# Patient Record
Sex: Male | Born: 1965
Health system: Southern US, Community
[De-identification: ages and names within clinical notes are randomized; demographics above are authoritative.]

## PROBLEM LIST (undated history)

## (undated) ENCOUNTER — Ambulatory Visit (HOSPITAL_COMMUNITY): Admission: EM | Payer: 59 | Source: Home / Self Care

## (undated) DIAGNOSIS — G959 Disease of spinal cord, unspecified: Secondary | ICD-10-CM

## (undated) DIAGNOSIS — I10 Essential (primary) hypertension: Secondary | ICD-10-CM

## (undated) DIAGNOSIS — R269 Unspecified abnormalities of gait and mobility: Secondary | ICD-10-CM

## (undated) HISTORY — DX: Disease of spinal cord, unspecified: G95.9

## (undated) HISTORY — DX: Unspecified abnormalities of gait and mobility: R26.9

## (undated) HISTORY — PX: OTHER SURGICAL HISTORY: SHX169

---

## 1997-07-22 ENCOUNTER — Encounter: Admission: RE | Admit: 1997-07-22 | Discharge: 1997-07-22 | Payer: Self-pay | Admitting: Hematology and Oncology

## 1998-08-15 ENCOUNTER — Emergency Department (HOSPITAL_COMMUNITY): Admission: EM | Admit: 1998-08-15 | Discharge: 1998-08-15 | Payer: Self-pay | Admitting: Emergency Medicine

## 1998-08-15 ENCOUNTER — Encounter: Payer: Self-pay | Admitting: Emergency Medicine

## 2008-05-10 ENCOUNTER — Emergency Department (HOSPITAL_COMMUNITY): Admission: EM | Admit: 2008-05-10 | Discharge: 2008-05-10 | Payer: Self-pay | Admitting: Emergency Medicine

## 2009-10-25 ENCOUNTER — Emergency Department (HOSPITAL_COMMUNITY): Admission: EM | Admit: 2009-10-25 | Discharge: 2009-10-25 | Payer: Self-pay | Admitting: Emergency Medicine

## 2009-10-26 ENCOUNTER — Observation Stay (HOSPITAL_COMMUNITY): Admission: EM | Admit: 2009-10-26 | Discharge: 2009-10-26 | Payer: Self-pay | Admitting: Emergency Medicine

## 2010-01-23 ENCOUNTER — Emergency Department (HOSPITAL_COMMUNITY): Admission: EM | Admit: 2010-01-23 | Discharge: 2010-01-23 | Payer: Self-pay | Admitting: Emergency Medicine

## 2010-01-23 ENCOUNTER — Encounter: Payer: Self-pay | Admitting: Internal Medicine

## 2010-01-30 ENCOUNTER — Ambulatory Visit: Payer: Self-pay | Admitting: Internal Medicine

## 2010-01-30 DIAGNOSIS — R9431 Abnormal electrocardiogram [ECG] [EKG]: Secondary | ICD-10-CM | POA: Insufficient documentation

## 2010-01-30 DIAGNOSIS — I10 Essential (primary) hypertension: Secondary | ICD-10-CM | POA: Insufficient documentation

## 2010-01-30 DIAGNOSIS — R079 Chest pain, unspecified: Secondary | ICD-10-CM | POA: Insufficient documentation

## 2010-01-30 DIAGNOSIS — I119 Hypertensive heart disease without heart failure: Secondary | ICD-10-CM | POA: Insufficient documentation

## 2010-04-20 NOTE — Assessment & Plan Note (Signed)
Summary: EPH   Visit Type:  new pt /  eph  CC:  no complaints.  History of Present Illness: patient is a 45 year old who was referred from the ER for evaluation of chest pain, abnormal ECG The patient was seen in the ER on 11/7 with chest pain.  He had said it began 3 days prior.  Worse with palpaiton  persistent.  Not associated with SOB.  ECG showed SB 59 bpm.  T wave inversion II, III, AVF, V3, V4, V5.   In the ER his bp was elevated.  It had been elevated on other ER visits.  He had been on BP meds in the past but stopped. Since last week his pain has improved some.  It is still present with pressing. He deneis Cp with activity.  No SOB.  No PND  No edema.  He is able to walk, cut grass without a problem.  Current Medications (verified): 1)  None  Allergies (verified): No Known Drug Allergies  Past History:  Past Medical History: Last updated: 01/27/2010 Hypertension chest wall pain  Family History: Reviewed history and no changes required. Mother 63 father 33.  Had CABG at 35  Social History: Reviewed history from 01/27/2010 and no changes required. Tobacco Use - 1/2 pack per week Alcohol Use - yes-occasional Drug Use - no  Review of Systems       All systems reviewed.  Neg to the abvoe problem except as noted above.  Vital Signs:  Patient profile:   45 year old male Height:      72 inches Weight:      150.50 pounds BMI:     20.49 Pulse rate:   59 / minute BP sitting:   132 / 86  (left arm) Cuff size:   regular  Vitals Entered By: Caralee Ates CMA (January 30, 2010 1:54 PM)  Physical Exam  Additional Exam:  patient is well developed 45 year old in NAD HEENT:  Normocephalic, atraumatic. EOMI, PERRLA.  Neck: JVP is normal. No thyromegaly. No bruits.  Lungs: clear to auscultation. No rales no wheezes.  Chest:  tender in L parasternal area. Heart: Regular rate and rhythm. Normal S1, S2. No S3.   No significant murmurs. PMI not displaced.  Abdomen:   Supple, nontender. Normal bowel sounds. No masses. No hepatomegaly.  Extremities:   Good distal pulses throughout. No lower extremity edema.  Musculoskeletal :moving all extremities.  Neuro:   alert and oriented x3.    EKG  Procedure date:  01/30/2010  Findings:      Sinus bradycardia.  59 bpm.  T wave inversion III, AVF.    Impression & Recommendations:  Problem # 1:  CHEST PAIN-UNSPECIFIED (ICD-786.50) pain is not cardiac.  Is musculoskel  Rec: rest, pain meds as needed.  Problem # 2:  HYPERTENSION, BENIGN (ICD-401.1) BP is fair today.  It has been elevated on ER visits.  This needs to be folllowed and treated if necessary.  the patient is trying to get into [facility] for primary care.    I would nt begin RX today as not signif elevated today.  Problem # 3:  ABNORMAL ELECTROCARDIOGRAM (ICD-794.31) ECG does have dynamic T wave changes.  I am not convinced these are diagnositc.  Patinet with normal exam.  No history to sugg ischemia/angina.  No work up planned.  Other Orders: EKG w/ Interpretation (93000)  Patient Instructions: 1)  Will be availabel as needed if symptoms change.  You  need to establish with a primary MD.

## 2010-05-30 LAB — POCT I-STAT, CHEM 8
BUN: 8 mg/dL (ref 6–23)
Calcium, Ion: 1.07 mmol/L — ABNORMAL LOW (ref 1.12–1.32)
Chloride: 103 mEq/L (ref 96–112)
Creatinine, Ser: 1.2 mg/dL (ref 0.4–1.5)
Glucose, Bld: 86 mg/dL (ref 70–99)
HCT: 43 % (ref 39.0–52.0)
Hemoglobin: 14.6 g/dL (ref 13.0–17.0)
Potassium: 4.1 mEq/L (ref 3.5–5.1)
Sodium: 136 mEq/L (ref 135–145)
TCO2: 26 mmol/L (ref 0–100)

## 2010-05-30 LAB — POCT CARDIAC MARKERS
CKMB, poc: <1 ng/mL — ABNORMAL LOW (ref 1.0–8.0)
Myoglobin, poc: 26.7 ng/mL (ref 12–200)
Troponin i, poc: <0.05 ng/mL (ref 0.00–0.09)

## 2010-09-06 ENCOUNTER — Emergency Department (HOSPITAL_COMMUNITY)
Admission: EM | Admit: 2010-09-06 | Discharge: 2010-09-06 | Disposition: A | Discharge status: Home / Self Care | Payer: Self-pay | Attending: Emergency Medicine | Admitting: Emergency Medicine

## 2010-09-06 DIAGNOSIS — T63461A Toxic effect of venom of wasps, accidental (unintentional), initial encounter: Secondary | ICD-10-CM | POA: Insufficient documentation

## 2010-09-06 DIAGNOSIS — I10 Essential (primary) hypertension: Secondary | ICD-10-CM | POA: Insufficient documentation

## 2010-09-06 DIAGNOSIS — R220 Localized swelling, mass and lump, head: Secondary | ICD-10-CM | POA: Insufficient documentation

## 2010-09-06 DIAGNOSIS — T6391XA Toxic effect of contact with unspecified venomous animal, accidental (unintentional), initial encounter: Secondary | ICD-10-CM | POA: Insufficient documentation

## 2010-09-06 DIAGNOSIS — L509 Urticaria, unspecified: Secondary | ICD-10-CM | POA: Insufficient documentation

## 2010-09-06 LAB — POCT I-STAT, CHEM 8
BUN: 9 mg/dL (ref 6–23)
Calcium, Ion: 1.14 mmol/L (ref 1.12–1.32)
Chloride: 105 mEq/L (ref 96–112)
Creatinine, Ser: 1 mg/dL (ref 0.50–1.35)
Glucose, Bld: 94 mg/dL (ref 70–99)
HCT: 48 % (ref 39.0–52.0)
Hemoglobin: 16.3 g/dL (ref 13.0–17.0)
Potassium: 3.9 mEq/L (ref 3.5–5.1)
Sodium: 139 mEq/L (ref 135–145)
TCO2: 22 mmol/L (ref 0–100)

## 2015-03-02 ENCOUNTER — Encounter (HOSPITAL_COMMUNITY): Payer: Self-pay | Admitting: Emergency Medicine

## 2015-03-02 ENCOUNTER — Emergency Department (INDEPENDENT_AMBULATORY_CARE_PROVIDER_SITE_OTHER)
Admission: EM | Admit: 2015-03-02 | Discharge: 2015-03-02 | Disposition: A | Discharge status: Home / Self Care | Payer: BLUE CROSS/BLUE SHIELD | Source: Home / Self Care

## 2015-03-02 DIAGNOSIS — T23221A Burn of second degree of single right finger (nail) except thumb, initial encounter: Secondary | ICD-10-CM

## 2015-03-02 NOTE — Discharge Instructions (Signed)
Burn Care °Burns hurt your skin. When your skin is hurt, it is easier to get an infection. Follow your doctor's directions to help prevent an infection. °HOME CARE °· Wash your hands well before you change your bandage. °· Change your bandage as often as told by your doctor. °¨ Remove the old bandage. If the bandage sticks, soak it off with cool, clean water. °¨ Gently clean the burn with mild soap and water. °¨ Pat the burn dry with a clean, dry cloth. °¨ Put a thin layer of medicated cream on the burn. °¨ Put a clean bandage on as told by your doctor. °¨ Keep the bandage clean and dry. °· Raise (elevate) the burn for the first 24 hours. After that, follow your doctor's directions. °· Only take medicine as told by your doctor. °GET HELP RIGHT AWAY IF:  °· You have too much pain. °· The skin near the burn is red, tender, puffy (swollen), or has red streaks. °· The burn area has yellowish white fluid (pus) or a bad smell coming from it. °· You have a fever. °MAKE SURE YOU:  °· Understand these instructions. °· Will watch your condition. °· Will get help right away if you are not doing well or get worse. °  °This information is not intended to replace advice given to you by your health care provider. Make sure you discuss any questions you have with your health care provider. °  °Document Released: 12/13/2007 Document Revised: 05/28/2011 Document Reviewed: 07/26/2010 °Elsevier Interactive Patient Education ©2016 [employer] ° °
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
## 2016-03-20 ENCOUNTER — Encounter (HOSPITAL_COMMUNITY): Payer: Self-pay

## 2016-03-20 ENCOUNTER — Emergency Department (HOSPITAL_COMMUNITY)
Admission: EM | Admit: 2016-03-20 | Discharge: 2016-03-21 | Disposition: A | Discharge status: Home / Self Care | Payer: PRIVATE HEALTH INSURANCE | Attending: Emergency Medicine | Admitting: Emergency Medicine

## 2016-03-20 DIAGNOSIS — F17210 Nicotine dependence, cigarettes, uncomplicated: Secondary | ICD-10-CM | POA: Diagnosis not present

## 2016-03-20 DIAGNOSIS — R531 Weakness: Secondary | ICD-10-CM | POA: Insufficient documentation

## 2016-03-20 DIAGNOSIS — G959 Disease of spinal cord, unspecified: Secondary | ICD-10-CM

## 2016-03-20 DIAGNOSIS — R29898 Other symptoms and signs involving the musculoskeletal system: Secondary | ICD-10-CM | POA: Diagnosis present

## 2016-03-20 DIAGNOSIS — R292 Abnormal reflex: Secondary | ICD-10-CM | POA: Insufficient documentation

## 2016-03-20 DIAGNOSIS — Z5181 Encounter for therapeutic drug level monitoring: Secondary | ICD-10-CM | POA: Diagnosis not present

## 2016-03-20 DIAGNOSIS — R200 Anesthesia of skin: Secondary | ICD-10-CM | POA: Diagnosis present

## 2016-03-20 DIAGNOSIS — R269 Unspecified abnormalities of gait and mobility: Secondary | ICD-10-CM | POA: Insufficient documentation

## 2016-03-20 DIAGNOSIS — I10 Essential (primary) hypertension: Secondary | ICD-10-CM | POA: Insufficient documentation

## 2016-03-20 DIAGNOSIS — G9520 Unspecified cord compression: Secondary | ICD-10-CM

## 2016-03-20 LAB — COMPREHENSIVE METABOLIC PANEL
ALT: 12 U/L — ABNORMAL LOW (ref 17–63)
AST: 25 U/L (ref 15–41)
Albumin: 3.9 g/dL (ref 3.5–5.0)
Alkaline Phosphatase: 41 U/L (ref 38–126)
Anion gap: 13 (ref 5–15)
BUN: 9 mg/dL (ref 6–20)
CO2: 22 mmol/L (ref 22–32)
Calcium: 9.4 mg/dL (ref 8.9–10.3)
Chloride: 101 mmol/L (ref 101–111)
Creatinine, Ser: 0.96 mg/dL (ref 0.61–1.24)
GFR calc Af Amer: >60 mL/min (ref >60)
GFR calc non Af Amer: >60 mL/min (ref >60)
Glucose, Bld: 121 mg/dL — ABNORMAL HIGH (ref 65–99)
Potassium: 4.1 mmol/L (ref 3.5–5.1)
Sodium: 136 mmol/L (ref 135–145)
Total Bilirubin: 0.6 mg/dL (ref 0.3–1.2)
Total Protein: 7.5 g/dL (ref 6.5–8.1)

## 2016-03-20 LAB — CBC WITH DIFFERENTIAL/PLATELET
Basophils Absolute: 0 10*3/uL (ref 0.0–0.1)
Basophils Relative: 1 %
Eosinophils Absolute: 0.1 10*3/uL (ref 0.0–0.7)
Eosinophils Relative: 2 %
HCT: 38.5 % — ABNORMAL LOW (ref 39.0–52.0)
Hemoglobin: 12.7 g/dL — ABNORMAL LOW (ref 13.0–17.0)
Lymphocytes Relative: 43 %
Lymphs Abs: 2.1 10*3/uL (ref 0.7–4.0)
MCH: 29.1 pg (ref 26.0–34.0)
MCHC: 33 g/dL (ref 30.0–36.0)
MCV: 88.1 fL (ref 78.0–100.0)
Monocytes Absolute: 0.4 10*3/uL (ref 0.1–1.0)
Monocytes Relative: 7 %
Neutro Abs: 2.4 10*3/uL (ref 1.7–7.7)
Neutrophils Relative %: 47 %
Platelets: 223 10*3/uL (ref 150–400)
RBC: 4.37 MIL/uL (ref 4.22–5.81)
RDW: 13.4 % (ref 11.5–15.5)
WBC: 5 10*3/uL (ref 4.0–10.5)

## 2016-03-20 NOTE — ED Triage Notes (Signed)
Onset 2 weeks numbness to bilateral legs and feet.  No pain.  Pt reports at times it feels like legs are going to give out.

## 2016-03-20 NOTE — ED Notes (Signed)
Asked pt for urine sample. Pt stated he went to the restroom earlier. Pt stated unable to at this time. Pt asking for permission to drink water.

## 2016-03-20 NOTE — ED Provider Notes (Signed)
[facility]-EMERGENCY DEPT Provider Note   CSN: [id number] Arrival date & time: 03/20/16  1332     History   Chief Complaint Chief Complaint  Patient presents with  . Numbness    HPI VIHAAN GLOSS is a 51 y.o. male.  HPI   MIKING USREY is a 51 y.o. male, with a history of Guillain-Barr, presenting to the ED with lower extremity weakness for at least the past month. The weakness is bilateral and progressive beginning in the feet and moving upward. Also endorses some increasing unsteadiness due to leg weakness. When asked about changes in sensation, patient states, "I feel like I can't feel my legs as well, but I know when you're touching them. Patient has a history of Guillain-Barr a number of years ago and states that his current symptoms feel similar.   Denies numbness, back pain, falls, recent illness, changes in bowel or bladder function, or any other complaints.    History reviewed. No pertinent past medical history.  Patient Active Problem List   Diagnosis Date Noted  . Lower extremity weakness 03/21/2016  . HYPERTENSION, BENIGN 01/30/2010  . 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 CHEST PAIN-UNSPECIFIED 01/30/2010  . ABNORMAL ELECTROCARDIOGRAM 01/30/2010    History reviewed. No pertinent surgical history.     Home Medications    Prior to Admission medications   Not on File    Family History History reviewed. No pertinent family history.  Social History Social History  Substance Use Topics  . Smoking status: Current Some Day Smoker    Types: Cigarettes  . Smokeless tobacco: Never Used  . Alcohol use 2.4 oz/week    4 Cans of beer per week     Allergies   Patient has no known allergies.   Review of Systems Review of Systems  Constitutional: Negative for chills and fever.  Respiratory: Negative for shortness of breath.   Cardiovascular: Negative for chest pain.  Gastrointestinal: Negative for abdominal pain, nausea and vomiting.   Genitourinary: Negative for decreased urine volume and difficulty urinating.  Neurological: Positive for weakness. Negative for syncope and headaches.  All other systems reviewed and are negative.    Physical Exam Updated Vital Signs BP 186/96 (BP Location: Right Arm)   Pulse (!) 59   Temp 99.1 F (37.3 C) (Oral)   Resp 16   Ht 5\' 11"  (1.803 m)   Wt 68 kg   SpO2 100%   BMI 20.92 kg/m   Physical Exam  Constitutional: He is oriented to person, place, and time. He appears well-developed and well-nourished. No distress.  HENT:  Head: Normocephalic and atraumatic.  Eyes: Conjunctivae are normal.  Neck: Neck supple.  Cardiovascular: Normal rate, regular rhythm, normal heart sounds and intact distal pulses.   Pulmonary/Chest: Effort normal and breath sounds normal. No respiratory distress.  Abdominal: Soft. There is no tenderness. There is no guarding.  Musculoskeletal: He exhibits no edema.  Lymphadenopathy:    He has no cervical adenopathy.  Neurological: He is alert and oriented to person, place, and time. He displays abnormal reflex.  Reflex Scores:      Patellar reflexes are 1+ on the right side and 1+ on the left side. Endorses decrease sensation to light touch and pain to the bilateral lower extremities. Strength 5 out of 5 bilaterally. Patient is unsteady while standing. Slow, hesitant gait with unsteadiness.  Skin: Skin is warm and dry. He is not diaphoretic.  Psychiatric: He has a normal mood and affect.  His behavior is normal.  Nursing note and vitals reviewed.    ED Treatments / Results  Labs (all labs ordered are listed, but only abnormal results are displayed) Labs Reviewed  COMPREHENSIVE METABOLIC PANEL - Abnormal; Notable for the following:       Result Value   Glucose, Bld 121 (*)    ALT 12 (*)    All other components within normal limits  CBC WITH DIFFERENTIAL/PLATELET - Abnormal; Notable for the following:    Hemoglobin 12.7 (*)    HCT 38.5 (*)     All other components within normal limits  URINALYSIS, ROUTINE W REFLEX MICROSCOPIC  RAPID URINE DRUG SCREEN, HOSP PERFORMED  VITAMIN B12  HIV-1 RNA, QUALITATIVE, TMA  COPPER, SERUM    EKG  EKG Interpretation None       Radiology No results found.  Procedures Procedures (including critical care time)  Medications Ordered in ED Medications - No data to display   Initial Impression / Assessment and Plan / ED Course  I have reviewed the triage vital signs and the nursing notes.  Pertinent labs & imaging results that were available during my care of the patient were reviewed by me and considered in my medical decision making (see chart for details).  Clinical Course     Patient presents with progressive lower extremity weakness. Abnormalities found on exam. Neurology consult. On reevaluation, patient has no progression of his symptoms and is resting comfortably on the bed.  9:26 PM Spoke with Dr. Otelia LimesLindzen, neurologist, who states he will come evaluate the patient in the ED. 12:18 AM Dr. Otelia LimesLindzen evaluated the patient. Suspects a possible spinal cord lesion or similar deficit. Requests cervical and thoracic spine MRIs, B12 level, and hospitalist admit with neurology consulting.  12:57 AM Spoke with Dr. Katrinka BlazingSmith, hospitalist, who agreed to admit the patient. Requested regular neuro checks added to the holding orders.   Findings and plan of care discussed with Crista Curbana Liu, MD.    Vitals:   03/20/16 1416 03/20/16 1815 03/20/16 1919 03/20/16 1930  BP: 177/95 175/84 186/96 156/86  Pulse: 62 77 (!) 59 (!) 53  Resp: 20 16 16    Temp: 99.1 F (37.3 C)     TempSrc: Oral     SpO2: 100% 100% 100% 99%  Weight: 68 kg     Height: 5\' 11"  (1.803 m)        Final Clinical Impressions(s) / ED Diagnoses   Final diagnoses:  Weakness of both lower extremities  Lower extremity weakness    New Prescriptions New Prescriptions   No medications on file     Anselm PancoastShawn C Shakila Mak, PA-C 03/21/16  0103    Lavera Guiseana Duo Liu, MD 03/21/16 [phone]

## 2016-03-20 NOTE — Consult Note (Signed)
NEURO HOSPITALIST CONSULT NOTE   Requestig physician: Dr. Verdie Mosher  Reason for Consult: Diffuse weakness and areflexia  History obtained from:   Patient   HPI:                                                                                                                                          Robert Carney is an 51 y.o. male who presents with worsening gait instability, lower extremity weakness and lower extremity sensory numbness beginning about 2 weeks ago. Symptoms started in the distal right leg, spread to the left leg, and progressed proximally to his hip region. More recently, has developed tingling and numbness of his right hand. The weakness is of sufficient magnitude to make him feel at times as if his legs are going to give way. He denies pain. He has no headache, fever, chills, chest pain or SOB.   He states that he had similar symptoms in 1993 that precipitated an admission to [facility] where he was diagnosed with "Guillain-Barre" and treated for such. He has no family history of MS.  History reviewed. No pertinent past medical history.  History reviewed. No pertinent surgical history.  History reviewed. No pertinent family history.  Social History:  reports that he has been smoking Cigarettes.  He has never used smokeless tobacco. He reports that he drinks about 2.4 oz of alcohol per week . He reports that he uses drugs, including Marijuana.  No Known Allergies  MEDICATIONS:                                                                                                                     No medications listed in EPIC.   ROS:  ROS obtained from patient. As per HPI.   Blood pressure 156/86, pulse (!) 53, temperature 99.1 F (37.3 C), temperature source Oral, resp. rate 16, height 5\' 11"  (1.803 m), weight 68 kg (150 lb), SpO2 99  %.  General Examination:                                                                                                      HEENT-  Normocephalic/atraumatic.   Lungs- No gross wheezing. Respirations unlabored.  Extremities- No cyanosis or edema.   Neurological Examination Mental Status: Alert, oriented, thought content appropriate.  Speech fluent without evidence of aphasia.  Able to follow all commands without difficulty. Cranial Nerves: II: Visual fields grossly normal, PERRL III,IV, VI: ptosis not present, EOMI V,VII: smile symmetric, facial temperature sensation normal bilaterally VIII: hearing intact to conversation IX,X: no hypophonia XI: bilateral shoulder shrug symmetric XII: midline tongue extension Motor: Right : Upper extremity   5/5    Left:     Upper extremity   5/5  Lower extremity   5/5     Lower extremity   5/5 Mildly increased tone bilateral lower extremities. Bulk in normal in the context of a thin body habitus.  Sensory: Decreased temperature sensation in stocking distribution. FT intact bilaterally. Severely impaired proprioception to toes bilaterally.  Deep Tendon Reflexes: Brisk 3+ reflexes symmetrically of upper and lower extremities.  Plantars: Equivocal bilaterally Cerebellar: No ataxia with FNF or RAM bilaterally Gait: Able to stand with own power. Positive Romberg sign. Gait narrow based with small steps and unsteadiness. Spastic quality to gait is a prominent finding.   Lab Results: Basic Metabolic Panel: No results for input(s): NA, K, CL, CO2, GLUCOSE, BUN, CREATININE, CALCIUM, MG, PHOS in the last 168 hours.  Liver Function Tests: No results for input(s): AST, ALT, ALKPHOS, BILITOT, PROT, ALBUMIN in the last 168 hours. No results for input(s): LIPASE, AMYLASE in the last 168 hours. No results for input(s): AMMONIA in the last 168 hours.  CBC: No results for input(s): WBC, NEUTROABS, HGB, HCT, MCV, PLT in the last 168 hours.  Cardiac Enzymes: No  results for input(s): CKTOTAL, CKMB, CKMBINDEX, TROPONINI in the last 168 hours.  Lipid Panel: No results for input(s): CHOL, TRIG, HDL, CHOLHDL, VLDL, LDLCALC in the last 168 hours.  CBG: No results for input(s): GLUCAP in the last 168 hours.  Microbiology: No results found for this or any previous visit.  Coagulation Studies: No results for input(s): LABPROT, INR in the last 72 hours.  Imaging: No results found.   Assessment: 1. Two week progressive history of gait instability, sensory numbness and lower extremity weakness. 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 DDx includes B12 or copper deficiency myelopathy, HIV myelopathy, MS, transverse myelitis or cord compression secondary to mass or disc herniation.  2. 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 Current examination not consistent with GBS.   Recommendations: 1. MRI thoracic and cervical spine with and without contrast.  2. B12 level.  3. Copper level.  4. HIV  PCR.  5. PT.  6. If above work up is negative, obtain MRI of brain and lumbar spine.  7. If a spinal cord lesion not due to vitamin or mineral deficiency is seen on MRI, will most likely need LP.   Electronically signed: Dr. Caryl Pina 03/20/2016, 9:26 PM

## 2016-03-21 ENCOUNTER — Inpatient Hospital Stay (HOSPITAL_COMMUNITY): Payer: PRIVATE HEALTH INSURANCE

## 2016-03-21 DIAGNOSIS — R29898 Other symptoms and signs involving the musculoskeletal system: Secondary | ICD-10-CM | POA: Diagnosis present

## 2016-03-21 LAB — URINALYSIS, ROUTINE W REFLEX MICROSCOPIC
Bilirubin Urine: NEGATIVE
Glucose, UA: NEGATIVE mg/dL
Hgb urine dipstick: NEGATIVE
Ketones, ur: NEGATIVE mg/dL
Leukocytes, UA: NEGATIVE
Nitrite: NEGATIVE
Protein, ur: NEGATIVE mg/dL
Specific Gravity, Urine: 1.01 (ref 1.005–1.030)
pH: 5 (ref 5.0–8.0)

## 2016-03-21 LAB — RAPID URINE DRUG SCREEN, HOSP PERFORMED
Amphetamines: NOT DETECTED
Barbiturates: NOT DETECTED
Benzodiazepines: NOT DETECTED
Cocaine: POSITIVE — AB
Opiates: NOT DETECTED
Tetrahydrocannabinol: POSITIVE — AB

## 2016-03-21 LAB — VITAMIN B12: Vitamin B-12: 204 pg/mL (ref 180–914)

## 2016-03-21 MED ORDER — GADOBENATE DIMEGLUMINE 529 MG/ML IV SOLN
15.0000 mL | Freq: Once | INTRAVENOUS | Status: AC | PRN
  Administered 2016-03-21: 15 mL via INTRAVENOUS

## 2016-03-21 NOTE — ED Notes (Signed)
Pt back from MRI 

## 2016-03-21 NOTE — ED Provider Notes (Signed)
I did not primarily care for this patient. I received a phone call from neurology regarding the patient's MRI result. He has edema and compression of the cervical spine. Symptoms have been ongoing for one month. Neurosurgery consulted. Discussed with Dr. Bevely Palmeritty.  Given duration of symptoms, Dr. Bevely Palmeritty feels patient can be evaluated on an outpatient patient's and surgery can be scheduled for later in the week. He does not recommend steroids. I discussed this with the patient. Patient reports that he is functional. He denies urinary symptoms. He states that he is able to ambulate he just sometimes "feels funny walking." Given over one month of symptoms, feel it is reasonable to discharge patient with close neurosurgical follow-up.  After history, exam, and medical workup I feel the patient has been appropriately medically screened and is safe for discharge home. Pertinent diagnoses were discussed with the patient. Patient was given return precautions.  Results for orders placed or performed during the hospital encounter of 03/20/16  Comprehensive metabolic panel  Result Value Ref Range   Sodium 136 135 - 145 mmol/L   Potassium 4.1 3.5 - 5.1 mmol/L   Chloride 101 101 - 111 mmol/L   CO2 22 22 - 32 mmol/L   Glucose, Bld 121 (H) 65 - 99 mg/dL   BUN 9 6 - 20 mg/dL   Creatinine, Ser 8.110.96 0.61 - 1.24 mg/dL   Calcium 9.4 8.9 - 91.410.3 mg/dL   Total Protein 7.5 6.5 - 8.1 g/dL   Albumin 3.9 3.5 - 5.0 g/dL   AST 25 15 - 41 U/L   ALT 12 (L) 17 - 63 U/L   Alkaline Phosphatase 41 38 - 126 U/L   Total Bilirubin 0.6 0.3 - 1.2 mg/dL   GFR calc non Af Amer >60 >60 mL/min   GFR calc Af Amer >60 >60 mL/min   Anion gap 13 5 - 15  CBC with Differential  Result Value Ref Range   WBC 5.0 4.0 - 10.5 K/uL   RBC 4.37 4.22 - 5.81 MIL/uL   Hemoglobin 12.7 (L) 13.0 - 17.0 g/dL   HCT 78.238.5 (L) 95.639.0 - 21.352.0 %   MCV 88.1 78.0 - 100.0 fL   MCH 29.1 26.0 - 34.0 pg   MCHC 33.0 30.0 - 36.0 g/dL   RDW 08.613.4 57.811.5 - 46.915.5 %    Platelets 223 150 - 400 K/uL   Neutrophils Relative % 47 %   Neutro Abs 2.4 1.7 - 7.7 K/uL   Lymphocytes Relative 43 %   Lymphs Abs 2.1 0.7 - 4.0 K/uL   Monocytes Relative 7 %   Monocytes Absolute 0.4 0.1 - 1.0 K/uL   Eosinophils Relative 2 %   Eosinophils Absolute 0.1 0.0 - 0.7 K/uL   Basophils Relative 1 %   Basophils Absolute 0.0 0.0 - 0.1 K/uL  Urinalysis, Routine w reflex microscopic  Result Value Ref Range   Color, Urine YELLOW YELLOW   APPearance CLEAR CLEAR   Specific Gravity, Urine 1.010 1.005 - 1.030   pH 5.0 5.0 - 8.0   Glucose, UA NEGATIVE NEGATIVE mg/dL   Hgb urine dipstick NEGATIVE NEGATIVE   Bilirubin Urine NEGATIVE NEGATIVE   Ketones, ur NEGATIVE NEGATIVE mg/dL   Protein, ur NEGATIVE NEGATIVE mg/dL   Nitrite NEGATIVE NEGATIVE   Leukocytes, UA NEGATIVE NEGATIVE  Urine rapid drug screen (hosp performed)  Result Value Ref Range   Opiates NONE DETECTED NONE DETECTED   Cocaine POSITIVE (A) NONE DETECTED   Benzodiazepines NONE DETECTED NONE DETECTED  Amphetamines NONE DETECTED NONE DETECTED   Tetrahydrocannabinol POSITIVE (A) NONE DETECTED   Barbiturates NONE DETECTED NONE DETECTED  Vitamin B12  Result Value Ref Range   Vitamin B-12 204 180 - 914 pg/mL   Mr Cervical Spine W Wo Contrast  Addendum Date: 03/21/2016   ADDENDUM REPORT: 03/21/2016 03:40 ADDENDUM: Acute findings discussed with and reconfirmed by Dr.ERIC LINDZEN on 03/21/2016 at 3:40 am. Electronically Signed   By: Awilda Metro M.D.   On: 03/21/2016 03:40   Result Date: 03/21/2016 CLINICAL DATA:  Lower extremity weakness for month, ascending weakness. Unsteady gait. History of Guillain-Barre with similar symptoms. EXAM: MRI CERVICAL AND THORACIC SPINE WITHOUT AND WITH CONTRAST TECHNIQUE: Multiplanar and multiecho pulse sequences of the cervical spine, to include the craniocervical junction and cervicothoracic junction, and thoracic spine, were obtained without and with intravenous contrast. CONTRAST:  15mL  MULTIHANCE GADOBENATE DIMEGLUMINE 529 MG/ML IV SOLN COMPARISON:  None. FINDINGS: MRI CERVICAL SPINE FINDINGS ALIGNMENT: Straightened cervical lordosis. Grade 1 (3 mm) C6-7 retrolisthesis. Grade 1 (2 mm) C7-T1 anterolisthesis. VERTEBRAE/DISCS: Vertebral bodies are intact. Moderate to severe C6-7 disc height loss, moderate C4-5, C5-6 and C7-T1. Severe chronic discogenic endplate changes C4-5 through C6-7, moderate C7-T1. Ventral auto interbody arthrodesis C5-6. Bone marrow edema C6 and C7, to lesser extent C5. C7-T1 facet effusion and bone marrow edema within the facets. No suspicious osseous or intradiscal enhancement. CORD:Mildly expansile bright T2 signal of the spinal cord at C7-T1 measuring up to 10 mm with punctate focus of enhancement posteriorly. Mild myelomalacia at C6-7. No syrinx. POSTERIOR FOSSA, VERTEBRAL ARTERIES, PARASPINAL TISSUES: No MR findings of ligamentous injury. Poorly visualized proximal RIGHT vertebral artery suggest partial occlusion with reconstitution mid P2 segment. DISC LEVELS: C2-3: Small broad-based disc bulge. Uncovertebral hypertrophy and moderate LEFT greater than RIGHT facet arthropathy. No canal stenosis. Mild LEFT neural foraminal narrowing. C3-4: Annular bulging. Uncovertebral hypertrophy. Severe LEFT facet arthropathy. No canal stenosis. Severe LEFT neural foraminal narrowing. C4-5: Annular bulging, moderate LEFT central to subarticular disc protrusion. Uncovertebral hypertrophy. Mild RIGHT, severe LEFT facet arthropathy. Mild canal stenosis. Severe LEFT neural foraminal narrowing. C5-6: No disc bulge, canal stenosis nor neural foraminal narrowing. C6-7: Retrolisthesis. 4 mm broad-based disc bulge, uncovertebral hypertrophy and mild facet arthropathy. Moderate to severe canal stenosis, AP dimension of the canal is 5 mm. Severe bilateral neural foraminal narrowing. C7-T1: Anterolisthesis. 4 mm broad-based disc bulge. Uncovertebral hypertrophy and moderate to severe facet  arthropathy. Severe canal stenosis. Severe bilateral neural foraminal narrowing. MRI THORACIC SPINE FINDINGS ALIGNMENT: Maintenance of the thoracic kyphosis. No malalignment. VERTEBRAE/DISCS: Vertebral bodies are intact. Intervertebral discs morphology and signal are normal. Multilevel mild chronic discogenic endplate changes. No abnormal bone marrow signal. No suspicious osseous or intradiscal enhancement. CORD: Thoracic spinal cord is normal morphology and signal characteristics to the level of the conus medullaris which terminates at T12-L1. PREVERTEBRAL AND PARASPINAL SOFT TISSUES: 9 mm RIGHT peritracheal lymph node. DISC LEVELS: No significant disc bulge, canal stenosis or neural foraminal narrowing. IMPRESSION: MRI CERVICAL SPINE: Expansile cord edema C7-T1 with punctate enhancement/inflammation suggesting pre syrinx from spinal canal stenosis, atypical for demyelination. Mild myelomalacia C6-7. Grade 1 C6-7 retrolisthesis and grade 1 C7-T1 anterolisthesis on degenerative basis, multilevel facet arthropathy and reactive changes. Severe canal stenosis C7-T1, moderate to severe at C6-7. Multilevel severe neural foraminal narrowing. Slow flow versus occlusion proximal RIGHT vertebral artery. MRI THORACIC SPINE:  Negative. Electronically Signed: By: Awilda Metro M.D. On: 03/21/2016 03:37   Mr Thoracic Spine W Wo Contrast  Addendum Date: 03/21/2016  ADDENDUM REPORT: 03/21/2016 03:40 ADDENDUM: Acute findings discussed with and reconfirmed by Dr.ERIC LINDZEN on 03/21/2016 at 3:40 am. Electronically Signed   By: Awilda Metro M.D.   On: 03/21/2016 03:40   Result Date: 03/21/2016 CLINICAL DATA:  Lower extremity weakness for month, ascending weakness. Unsteady gait. History of Guillain-Barre with similar symptoms. EXAM: MRI CERVICAL AND THORACIC SPINE WITHOUT AND WITH CONTRAST TECHNIQUE: Multiplanar and multiecho pulse sequences of the cervical spine, to include the craniocervical junction and  cervicothoracic junction, and thoracic spine, were obtained without and with intravenous contrast. CONTRAST:  15mL MULTIHANCE GADOBENATE DIMEGLUMINE 529 MG/ML IV SOLN COMPARISON:  None. FINDINGS: MRI CERVICAL SPINE FINDINGS ALIGNMENT: Straightened cervical lordosis. Grade 1 (3 mm) C6-7 retrolisthesis. Grade 1 (2 mm) C7-T1 anterolisthesis. VERTEBRAE/DISCS: Vertebral bodies are intact. Moderate to severe C6-7 disc height loss, moderate C4-5, C5-6 and C7-T1. Severe chronic discogenic endplate changes C4-5 through C6-7, moderate C7-T1. Ventral auto interbody arthrodesis C5-6. Bone marrow edema C6 and C7, to lesser extent C5. C7-T1 facet effusion and bone marrow edema within the facets. No suspicious osseous or intradiscal enhancement. CORD:Mildly expansile bright T2 signal of the spinal cord at C7-T1 measuring up to 10 mm with punctate focus of enhancement posteriorly. Mild myelomalacia at C6-7. No syrinx. POSTERIOR FOSSA, VERTEBRAL ARTERIES, PARASPINAL TISSUES: No MR findings of ligamentous injury. Poorly visualized proximal RIGHT vertebral artery suggest partial occlusion with reconstitution mid P2 segment. DISC LEVELS: C2-3: Small broad-based disc bulge. Uncovertebral hypertrophy and moderate LEFT greater than RIGHT facet arthropathy. No canal stenosis. Mild LEFT neural foraminal narrowing. C3-4: Annular bulging. Uncovertebral hypertrophy. Severe LEFT facet arthropathy. No canal stenosis. Severe LEFT neural foraminal narrowing. C4-5: Annular bulging, moderate LEFT central to subarticular disc protrusion. Uncovertebral hypertrophy. Mild RIGHT, severe LEFT facet arthropathy. Mild canal stenosis. Severe LEFT neural foraminal narrowing. C5-6: No disc bulge, canal stenosis nor neural foraminal narrowing. C6-7: Retrolisthesis. 4 mm broad-based disc bulge, uncovertebral hypertrophy and mild facet arthropathy. Moderate to severe canal stenosis, AP dimension of the canal is 5 mm. Severe bilateral neural foraminal narrowing.  C7-T1: Anterolisthesis. 4 mm broad-based disc bulge. Uncovertebral hypertrophy and moderate to severe facet arthropathy. Severe canal stenosis. Severe bilateral neural foraminal narrowing. MRI THORACIC SPINE FINDINGS ALIGNMENT: Maintenance of the thoracic kyphosis. No malalignment. VERTEBRAE/DISCS: Vertebral bodies are intact. Intervertebral discs morphology and signal are normal. Multilevel mild chronic discogenic endplate changes. No abnormal bone marrow signal. No suspicious osseous or intradiscal enhancement. CORD: Thoracic spinal cord is normal morphology and signal characteristics to the level of the conus medullaris which terminates at T12-L1. PREVERTEBRAL AND PARASPINAL SOFT TISSUES: 9 mm RIGHT peritracheal lymph node. DISC LEVELS: No significant disc bulge, canal stenosis or neural foraminal narrowing. IMPRESSION: MRI CERVICAL SPINE: Expansile cord edema C7-T1 with punctate enhancement/inflammation suggesting pre syrinx from spinal canal stenosis, atypical for demyelination. Mild myelomalacia C6-7. Grade 1 C6-7 retrolisthesis and grade 1 C7-T1 anterolisthesis on degenerative basis, multilevel facet arthropathy and reactive changes. Severe canal stenosis C7-T1, moderate to severe at C6-7. Multilevel severe neural foraminal narrowing. Slow flow versus occlusion proximal RIGHT vertebral artery. MRI THORACIC SPINE:  Negative. Electronically Signed: By: Awilda Metro M.D. On: 03/21/2016 03:37      Shon Baton, MD 03/21/16 [phone]

## 2016-03-21 NOTE — Discharge Instructions (Signed)
You were seen today for lower extremity weakness. Your MRI shows evidence of spinal cord compression and swelling at the neck. He will likely need to have surgery for decompression. You need to call Dr. Mervyn Gayitty's office as soon as possible for evaluation later today and to schedule surgery. If you develop difficulty urinating, worsening weakness or any new or worsening symptoms you need to be reevaluated.

## 2016-03-22 ENCOUNTER — Other Ambulatory Visit: Payer: Self-pay | Admitting: Neurological Surgery

## 2016-03-22 LAB — HIV-1 RNA, QUALITATIVE, TMA: HIV-1 RNA, Qualitative, TMA: NEGATIVE

## 2016-03-23 ENCOUNTER — Encounter (HOSPITAL_COMMUNITY): Payer: Self-pay | Admitting: *Deleted

## 2016-03-23 LAB — COPPER, SERUM: Copper: 118 ug/dL (ref 72–166)

## 2016-03-23 NOTE — Progress Notes (Signed)
Spoke with pt for pre-op call. Pt denies cardiac history, chest pain or sob. Pt states he's been treated for HTN in the past but is not taking any medications for it at this time. He states he does not have a PCP. When asked if he checks his blood pressure, he states "it's fine, around 150/80". Encouraged pt to get a PCP.

## 2016-03-26 ENCOUNTER — Inpatient Hospital Stay (HOSPITAL_COMMUNITY)
Admission: RE | Admit: 2016-03-26 | Discharge: 2016-03-27 | DRG: 472 | Disposition: A | Discharge status: Home / Self Care | Payer: PRIVATE HEALTH INSURANCE | Source: Ambulatory Visit | Attending: Neurological Surgery | Admitting: Neurological Surgery

## 2016-03-26 ENCOUNTER — Inpatient Hospital Stay (HOSPITAL_COMMUNITY): Payer: PRIVATE HEALTH INSURANCE | Admitting: Anesthesiology

## 2016-03-26 ENCOUNTER — Encounter (HOSPITAL_COMMUNITY): Payer: Self-pay | Admitting: *Deleted

## 2016-03-26 ENCOUNTER — Inpatient Hospital Stay (HOSPITAL_COMMUNITY): Payer: PRIVATE HEALTH INSURANCE

## 2016-03-26 ENCOUNTER — Encounter (HOSPITAL_COMMUNITY)
Admission: RE | Disposition: A | Discharge status: Home / Self Care | Payer: Self-pay | Source: Ambulatory Visit | Attending: Neurological Surgery

## 2016-03-26 DIAGNOSIS — M4712 Other spondylosis with myelopathy, cervical region: Secondary | ICD-10-CM | POA: Diagnosis present

## 2016-03-26 DIAGNOSIS — R739 Hyperglycemia, unspecified: Secondary | ICD-10-CM | POA: Diagnosis not present

## 2016-03-26 DIAGNOSIS — I10 Essential (primary) hypertension: Secondary | ICD-10-CM | POA: Diagnosis present

## 2016-03-26 DIAGNOSIS — F17210 Nicotine dependence, cigarettes, uncomplicated: Secondary | ICD-10-CM | POA: Diagnosis present

## 2016-03-26 DIAGNOSIS — I160 Hypertensive urgency: Secondary | ICD-10-CM

## 2016-03-26 DIAGNOSIS — M4313 Spondylolisthesis, cervicothoracic region: Principal | ICD-10-CM | POA: Diagnosis present

## 2016-03-26 DIAGNOSIS — M4802 Spinal stenosis, cervical region: Secondary | ICD-10-CM

## 2016-03-26 DIAGNOSIS — Z833 Family history of diabetes mellitus: Secondary | ICD-10-CM | POA: Diagnosis not present

## 2016-03-26 DIAGNOSIS — F1910 Other psychoactive substance abuse, uncomplicated: Secondary | ICD-10-CM

## 2016-03-26 DIAGNOSIS — F1010 Alcohol abuse, uncomplicated: Secondary | ICD-10-CM | POA: Diagnosis not present

## 2016-03-26 HISTORY — PX: ANTERIOR CERVICAL CORPECTOMY: SHX1159

## 2016-03-26 HISTORY — DX: Essential (primary) hypertension: I10

## 2016-03-26 LAB — SURGICAL PCR SCREEN
MRSA, PCR: NEGATIVE
Staphylococcus aureus: POSITIVE — AB

## 2016-03-26 LAB — GLUCOSE, CAPILLARY: Glucose-Capillary: 148 mg/dL — ABNORMAL HIGH (ref 65–99)

## 2016-03-26 LAB — TYPE AND SCREEN
ABO/RH(D): A POS
Antibody Screen: NEGATIVE

## 2016-03-26 LAB — TROPONIN I: Troponin I: <0.03 ng/mL (ref <0.03)

## 2016-03-26 LAB — ABO/RH: ABO/RH(D): A POS

## 2016-03-26 SURGERY — ANTERIOR CERVICAL CORPECTOMY
Anesthesia: General | Site: Spine Cervical

## 2016-03-26 MED ORDER — THROMBIN 5000 UNITS EX SOLR
CUTANEOUS | Status: AC
  Filled 2016-03-26: qty 15000

## 2016-03-26 MED ORDER — LIDOCAINE-EPINEPHRINE (PF) 2 %-1:200000 IJ SOLN
INTRAMUSCULAR | Status: AC
  Filled 2016-03-26: qty 20

## 2016-03-26 MED ORDER — SUGAMMADEX SODIUM 200 MG/2ML IV SOLN
INTRAVENOUS | Status: DC | PRN
  Administered 2016-03-26: 200 mg via INTRAVENOUS

## 2016-03-26 MED ORDER — CHLORHEXIDINE GLUCONATE CLOTH 2 % EX PADS: 6.0000 | MEDICATED_PAD | Freq: Once | CUTANEOUS | Status: DC

## 2016-03-26 MED ORDER — EPHEDRINE 5 MG/ML INJ
INTRAVENOUS | Status: AC
  Filled 2016-03-26: qty 10

## 2016-03-26 MED ORDER — PROPOFOL 10 MG/ML IV BOLUS
INTRAVENOUS | Status: AC
  Filled 2016-03-26: qty 20

## 2016-03-26 MED ORDER — DOCUSATE SODIUM 100 MG PO CAPS
100.0000 mg | ORAL_CAPSULE | Freq: Two times a day (BID) | ORAL | Status: DC
  Administered 2016-03-27: 100 mg via ORAL
  Filled 2016-03-26 (×2): qty 1

## 2016-03-26 MED ORDER — OXYCODONE HCL 5 MG PO TABS: 5.0000 mg | ORAL_TABLET | Freq: Once | ORAL | Status: DC | PRN

## 2016-03-26 MED ORDER — HYDROMORPHONE HCL 1 MG/ML IJ SOLN
0.2500 mg | INTRAMUSCULAR | Status: DC | PRN
  Administered 2016-03-26 (×2): 0.5 mg via INTRAVENOUS

## 2016-03-26 MED ORDER — SODIUM CHLORIDE 0.9 % IV SOLN: 250.0000 mL | INTRAVENOUS | Status: DC

## 2016-03-26 MED ORDER — GABAPENTIN 300 MG PO CAPS
300.0000 mg | ORAL_CAPSULE | Freq: Three times a day (TID) | ORAL | Status: DC
  Administered 2016-03-26 – 2016-03-27 (×4): 300 mg via ORAL
  Filled 2016-03-26 (×4): qty 1

## 2016-03-26 MED ORDER — SUCCINYLCHOLINE CHLORIDE 200 MG/10ML IV SOSY
PREFILLED_SYRINGE | INTRAVENOUS | Status: AC
  Filled 2016-03-26: qty 10

## 2016-03-26 MED ORDER — NICOTINE POLACRILEX 2 MG MT GUM
2.0000 mg | CHEWING_GUM | OROMUCOSAL | Status: DC | PRN
  Filled 2016-03-26: qty 1

## 2016-03-26 MED ORDER — PROPOFOL 10 MG/ML IV BOLUS
INTRAVENOUS | Status: DC | PRN
  Administered 2016-03-26 (×2): 30 mg via INTRAVENOUS
  Administered 2016-03-26: 100 mg via INTRAVENOUS

## 2016-03-26 MED ORDER — OXYCODONE HCL 5 MG PO TABS
5.0000 mg | ORAL_TABLET | ORAL | Status: DC | PRN
  Administered 2016-03-26: 10 mg via ORAL

## 2016-03-26 MED ORDER — SODIUM CHLORIDE 0.9 % IV SOLN
INTRAVENOUS | Status: DC
  Administered 2016-03-26: 13:00:00 via INTRAVENOUS

## 2016-03-26 MED ORDER — ROCURONIUM BROMIDE 100 MG/10ML IV SOLN
INTRAVENOUS | Status: DC | PRN
  Administered 2016-03-26: 10 mg via INTRAVENOUS
  Administered 2016-03-26: 50 mg via INTRAVENOUS
  Administered 2016-03-26: 10 mg via INTRAVENOUS

## 2016-03-26 MED ORDER — CELECOXIB 200 MG PO CAPS
200.0000 mg | ORAL_CAPSULE | ORAL | Status: AC
  Administered 2016-03-26: 200 mg via ORAL
  Filled 2016-03-26: qty 1

## 2016-03-26 MED ORDER — HYDROCHLOROTHIAZIDE 25 MG PO TABS
25.0000 mg | ORAL_TABLET | Freq: Every day | ORAL | Status: DC
  Administered 2016-03-27: 25 mg via ORAL
  Filled 2016-03-26: qty 1

## 2016-03-26 MED ORDER — ACETAMINOPHEN 500 MG PO TABS
1000.0000 mg | ORAL_TABLET | Freq: Four times a day (QID) | ORAL | Status: DC
Start: 2016-03-26 — End: 2016-03-27
  Administered 2016-03-26 – 2016-03-27 (×2): 1000 mg via ORAL
  Filled 2016-03-26 (×4): qty 2

## 2016-03-26 MED ORDER — METHOCARBAMOL 500 MG PO TABS
ORAL_TABLET | ORAL | Status: AC
  Filled 2016-03-26: qty 2

## 2016-03-26 MED ORDER — MUPIROCIN 2 % EX OINT
1.0000 "application " | TOPICAL_OINTMENT | Freq: Once | CUTANEOUS | Status: AC
  Administered 2016-03-26: 1 via TOPICAL
  Filled 2016-03-26: qty 22

## 2016-03-26 MED ORDER — CEFAZOLIN IN D5W 1 GM/50ML IV SOLN
1.0000 g | Freq: Three times a day (TID) | INTRAVENOUS | Status: DC
  Administered 2016-03-26 – 2016-03-27 (×3): 1 g via INTRAVENOUS
  Filled 2016-03-26 (×3): qty 50

## 2016-03-26 MED ORDER — ONDANSETRON HCL 4 MG/2ML IJ SOLN
4.0000 mg | INTRAMUSCULAR | Status: DC | PRN
  Filled 2016-03-26: qty 2

## 2016-03-26 MED ORDER — OXYCODONE HCL 5 MG/5ML PO SOLN: 5.0000 mg | Freq: Once | ORAL | Status: DC | PRN

## 2016-03-26 MED ORDER — METHOCARBAMOL 500 MG PO TABS
750.0000 mg | ORAL_TABLET | Freq: Four times a day (QID) | ORAL | Status: DC
  Administered 2016-03-26 – 2016-03-27 (×2): 750 mg via ORAL
  Filled 2016-03-26: qty 1
  Filled 2016-03-26: qty 2
  Filled 2016-03-26 (×4): qty 1

## 2016-03-26 MED ORDER — MUPIROCIN 2 % EX OINT
TOPICAL_OINTMENT | Freq: Two times a day (BID) | CUTANEOUS | Status: DC
  Administered 2016-03-26: 1 via NASAL
  Administered 2016-03-27: 08:00:00 via NASAL
  Filled 2016-03-26: qty 22

## 2016-03-26 MED ORDER — 0.9 % SODIUM CHLORIDE (POUR BTL) OPTIME
TOPICAL | Status: DC | PRN
  Administered 2016-03-26: 1000 mL

## 2016-03-26 MED ORDER — CELECOXIB 200 MG PO CAPS
200.0000 mg | ORAL_CAPSULE | Freq: Two times a day (BID) | ORAL | Status: DC
  Administered 2016-03-26 – 2016-03-27 (×2): 200 mg via ORAL
  Filled 2016-03-26 (×3): qty 1

## 2016-03-26 MED ORDER — BISACODYL 10 MG RE SUPP: 10.0000 mg | Freq: Every day | RECTAL | Status: DC | PRN

## 2016-03-26 MED ORDER — BUPIVACAINE-EPINEPHRINE (PF) 0.5% -1:200000 IJ SOLN
INTRAMUSCULAR | Status: DC | PRN
  Administered 2016-03-26: 10 mL via PERINEURAL

## 2016-03-26 MED ORDER — ONDANSETRON HCL 4 MG/2ML IJ SOLN
INTRAMUSCULAR | Status: AC
  Filled 2016-03-26: qty 2

## 2016-03-26 MED ORDER — PHENYLEPHRINE HCL 10 MG/ML IJ SOLN
INTRAVENOUS | Status: DC | PRN
  Administered 2016-03-26: 20 ug/min via INTRAVENOUS

## 2016-03-26 MED ORDER — FENTANYL CITRATE (PF) 100 MCG/2ML IJ SOLN
INTRAMUSCULAR | Status: AC
  Filled 2016-03-26: qty 4

## 2016-03-26 MED ORDER — SODIUM CHLORIDE 0.9 % IR SOLN
Status: DC | PRN
  Administered 2016-03-26: 08:00:00

## 2016-03-26 MED ORDER — ONDANSETRON HCL 4 MG/2ML IJ SOLN
INTRAMUSCULAR | Status: DC | PRN
  Administered 2016-03-26: 4 mg via INTRAVENOUS

## 2016-03-26 MED ORDER — CEFAZOLIN IN D5W 1 GM/50ML IV SOLN
INTRAVENOUS | Status: AC
  Filled 2016-03-26: qty 50

## 2016-03-26 MED ORDER — ZOLPIDEM TARTRATE 5 MG PO TABS
5.0000 mg | ORAL_TABLET | Freq: Every evening | ORAL | Status: DC | PRN
  Filled 2016-03-26: qty 1

## 2016-03-26 MED ORDER — EPHEDRINE SULFATE-NACL 50-0.9 MG/10ML-% IV SOSY
PREFILLED_SYRINGE | INTRAVENOUS | Status: DC | PRN
  Administered 2016-03-26: 5 mg via INTRAVENOUS

## 2016-03-26 MED ORDER — PANTOPRAZOLE SODIUM 40 MG IV SOLR
40.0000 mg | Freq: Every day | INTRAVENOUS | Status: DC
  Administered 2016-03-26: 40 mg via INTRAVENOUS
  Filled 2016-03-26: qty 40

## 2016-03-26 MED ORDER — PHENOL 1.4 % MT LIQD: 1.0000 | OROMUCOSAL | Status: DC | PRN

## 2016-03-26 MED ORDER — FLEET ENEMA 7-19 GM/118ML RE ENEM
1.0000 | ENEMA | Freq: Once | RECTAL | Status: DC | PRN
Start: 2016-03-26 — End: 2016-03-27

## 2016-03-26 MED ORDER — OXYCODONE HCL 5 MG PO TABS
ORAL_TABLET | ORAL | Status: AC
  Filled 2016-03-26: qty 2

## 2016-03-26 MED ORDER — THROMBIN 5000 UNITS EX SOLR
CUTANEOUS | Status: DC | PRN
  Administered 2016-03-26: 10000 [IU] via TOPICAL

## 2016-03-26 MED ORDER — THROMBIN 5000 UNITS EX SOLR
OROMUCOSAL | Status: DC | PRN
  Administered 2016-03-26 (×2): via TOPICAL

## 2016-03-26 MED ORDER — LIDOCAINE HCL (CARDIAC) 20 MG/ML IV SOLN
INTRAVENOUS | Status: DC | PRN
  Administered 2016-03-26: 50 mg via INTRAVENOUS

## 2016-03-26 MED ORDER — MENTHOL 3 MG MT LOZG: 1.0000 | LOZENGE | OROMUCOSAL | Status: DC | PRN

## 2016-03-26 MED ORDER — SODIUM CHLORIDE 0.9% FLUSH
3.0000 mL | Freq: Two times a day (BID) | INTRAVENOUS | Status: DC
  Administered 2016-03-26: 3 mL via INTRAVENOUS

## 2016-03-26 MED ORDER — LACTATED RINGERS IV SOLN
INTRAVENOUS | Status: DC | PRN
  Administered 2016-03-26 (×2): via INTRAVENOUS

## 2016-03-26 MED ORDER — OXYCODONE HCL ER 10 MG PO T12A
10.0000 mg | EXTENDED_RELEASE_TABLET | Freq: Two times a day (BID) | ORAL | Status: DC
  Administered 2016-03-26 – 2016-03-27 (×2): 10 mg via ORAL
  Filled 2016-03-26 (×2): qty 1

## 2016-03-26 MED ORDER — HYDROMORPHONE HCL 1 MG/ML IJ SOLN: 1.0000 mg | INTRAMUSCULAR | Status: DC | PRN

## 2016-03-26 MED ORDER — HYDROMORPHONE HCL 1 MG/ML IJ SOLN
INTRAMUSCULAR | Status: AC
  Filled 2016-03-26: qty 1

## 2016-03-26 MED ORDER — SODIUM CHLORIDE 0.9% FLUSH: 3.0000 mL | INTRAVENOUS | Status: DC | PRN

## 2016-03-26 MED ORDER — THROMBIN 5000 UNITS EX SOLR
CUTANEOUS | Status: AC
  Filled 2016-03-26: qty 5000

## 2016-03-26 MED ORDER — HYDRALAZINE HCL 20 MG/ML IJ SOLN
20.0000 mg | INTRAMUSCULAR | Status: DC | PRN
  Administered 2016-03-26: 20 mg via INTRAVENOUS

## 2016-03-26 MED ORDER — DEXAMETHASONE SODIUM PHOSPHATE 10 MG/ML IJ SOLN
INTRAMUSCULAR | Status: DC | PRN
  Administered 2016-03-26: 10 mg via INTRAVENOUS

## 2016-03-26 MED ORDER — MIDAZOLAM HCL 2 MG/2ML IJ SOLN
INTRAMUSCULAR | Status: AC
  Filled 2016-03-26: qty 2

## 2016-03-26 MED ORDER — BUPIVACAINE HCL (PF) 0.5 % IJ SOLN
INTRAMUSCULAR | Status: AC
  Filled 2016-03-26: qty 30

## 2016-03-26 MED ORDER — LIDOCAINE-EPINEPHRINE 2 %-1:100000 IJ SOLN
INTRAMUSCULAR | Status: DC | PRN
  Administered 2016-03-26: 10 mL via INTRADERMAL

## 2016-03-26 MED ORDER — HEMOSTATIC AGENTS (NO CHARGE) OPTIME
TOPICAL | Status: DC | PRN
  Administered 2016-03-26: 1 via TOPICAL

## 2016-03-26 MED ORDER — HYDRALAZINE HCL 20 MG/ML IJ SOLN
INTRAMUSCULAR | Status: AC
  Filled 2016-03-26: qty 1

## 2016-03-26 MED ORDER — CEFAZOLIN SODIUM-DEXTROSE 2-4 GM/100ML-% IV SOLN
2.0000 g | INTRAVENOUS | Status: AC
  Administered 2016-03-26: 2 g via INTRAVENOUS
  Filled 2016-03-26: qty 100

## 2016-03-26 MED ORDER — FENTANYL CITRATE (PF) 100 MCG/2ML IJ SOLN
INTRAMUSCULAR | Status: DC | PRN
  Administered 2016-03-26: 200 ug via INTRAVENOUS
  Administered 2016-03-26: 50 ug via INTRAVENOUS

## 2016-03-26 MED ORDER — FENTANYL CITRATE (PF) 100 MCG/2ML IJ SOLN
INTRAMUSCULAR | Status: AC
  Filled 2016-03-26: qty 2

## 2016-03-26 MED ORDER — SENNA 8.6 MG PO TABS
1.0000 | ORAL_TABLET | Freq: Two times a day (BID) | ORAL | Status: DC
  Administered 2016-03-27: 8.6 mg via ORAL
  Filled 2016-03-26 (×2): qty 1

## 2016-03-26 SURGICAL SUPPLY — 75 items
ADH SKN CLS APL DERMABOND .7 (GAUZE/BANDAGES/DRESSINGS) ×1
BASKET BONE COLLECTION (BASKET) ×2 IMPLANT
BIT DRILL NEURO 2X3.1 SFT TUCH (MISCELLANEOUS) ×1 IMPLANT
BLADE SURG 11 STRL SS (BLADE) ×2 IMPLANT
BLADE ULTRA TIP 2M (BLADE) IMPLANT
BONE MATRIX OSTEOCEL PRO MED (Bone Implant) ×2 IMPLANT
BUR MATCHSTICK NEURO 3.0 LAGG (BURR) ×2 IMPLANT
CANISTER SUCT 3000ML PPV (MISCELLANEOUS) ×2 IMPLANT
CAP END 17X14 XC MINI PARALLEL ×2 IMPLANT
CARTRIDGE OIL MAESTRO DRILL (MISCELLANEOUS) ×1 IMPLANT
CHLORAPREP W/TINT 26ML (MISCELLANEOUS) ×2 IMPLANT
CORE XCORE MINI 012MM 15-20MM ×2 IMPLANT
DECANTER SPIKE VIAL GLASS SM (MISCELLANEOUS) ×2 IMPLANT
DERMABOND ADVANCED (GAUZE/BANDAGES/DRESSINGS) ×1
DERMABOND ADVANCED .7 DNX12 (GAUZE/BANDAGES/DRESSINGS) ×1 IMPLANT
DIFFUSER DRILL AIR PNEUMATIC (MISCELLANEOUS) ×2 IMPLANT
DRAPE C-ARM 42X72 X-RAY (DRAPES) ×4 IMPLANT
DRAPE HALF SHEET 40X57 (DRAPES) IMPLANT
DRAPE LAPAROTOMY 100X72 PEDS (DRAPES) ×2 IMPLANT
DRAPE MICROSCOPE LEICA (MISCELLANEOUS) ×2 IMPLANT
DRAPE POUCH INSTRU U-SHP 10X18 (DRAPES) ×2 IMPLANT
DRAPE SHEET LG 3/4 BI-LAMINATE (DRAPES) ×2 IMPLANT
DRILL BIT HELIX (BIT) ×2 IMPLANT
DRILL NEURO 2X3.1 SOFT TOUCH (MISCELLANEOUS) ×2
ELECT COATED BLADE 2.86 ST (ELECTRODE) ×2 IMPLANT
ELECT REM PT RETURN 9FT ADLT (ELECTROSURGICAL) ×2
ELECTRODE REM PT RTRN 9FT ADLT (ELECTROSURGICAL) ×1 IMPLANT
ENDCAP XC MINI 17X14 PARALLEL ×2
EVACUATOR 1/8 PVC DRAIN (DRAIN) ×2 IMPLANT
GAUZE SPONGE 4X4 12PLY STRL (GAUZE/BANDAGES/DRESSINGS) IMPLANT
GAUZE SPONGE 4X4 16PLY XRAY LF (GAUZE/BANDAGES/DRESSINGS) IMPLANT
GLOVE BIO SURGEON STRL SZ8 (GLOVE) ×2 IMPLANT
GLOVE BIOGEL PI IND STRL 7.5 (GLOVE) ×2 IMPLANT
GLOVE BIOGEL PI INDICATOR 7.5 (GLOVE) ×2
GLOVE INDICATOR 8.5 STRL (GLOVE) ×2 IMPLANT
GLOVE SS BIOGEL STRL SZ 7.5 (GLOVE) ×2 IMPLANT
GLOVE SUPERSENSE BIOGEL SZ 7.5 (GLOVE) ×2
GLOVE SURG SS PI 7.0 STRL IVOR (GLOVE) ×6 IMPLANT
GOWN STRL REUS W/ TWL LRG LVL3 (GOWN DISPOSABLE) ×2 IMPLANT
GOWN STRL REUS W/ TWL XL LVL3 (GOWN DISPOSABLE) ×2 IMPLANT
GOWN STRL REUS W/TWL LRG LVL3 (GOWN DISPOSABLE) ×4
GOWN STRL REUS W/TWL XL LVL3 (GOWN DISPOSABLE) ×4
HEMOSTAT POWDER KIT SURGIFOAM (HEMOSTASIS) ×4 IMPLANT
KIT BASIN OR (CUSTOM PROCEDURE TRAY) ×2 IMPLANT
KIT ROOM TURNOVER OR (KITS) ×2 IMPLANT
MAXCESS-C SCREW, 14MM DISTRACTION IMPLANT
NEEDLE HYPO 21X1.5 SAFETY (NEEDLE) ×2 IMPLANT
NEEDLE SPNL 18GX3.5 QUINCKE PK (NEEDLE) ×2 IMPLANT
NEEDLE SPNL 22GX3.5 QUINCKE BK (NEEDLE) ×2 IMPLANT
NS IRRIG 1000ML POUR BTL (IV SOLUTION) ×2 IMPLANT
OIL CARTRIDGE MAESTRO DRILL (MISCELLANEOUS) ×2
PACK LAMINECTOMY NEURO (CUSTOM PROCEDURE TRAY) ×2 IMPLANT
PAD ARMBOARD 7.5X6 YLW CONV (MISCELLANEOUS) ×4 IMPLANT
PATTIES SURGICAL .5X1.5 (GAUZE/BANDAGES/DRESSINGS) ×2 IMPLANT
PIN ARCHON ACP FIXATION TEMP (EXFIX) ×2
PIN DISTRACTION MAXCESS-C 14 (Screw) ×4 IMPLANT
PIN FXTEMP SPNE ARCHON ACP (EXFIX) ×1 IMPLANT
PLATE ARCHON-R 40MM 2L (Plate) ×2 IMPLANT
RUBBERBAND STERILE (MISCELLANEOUS) ×4 IMPLANT
SCREW ARCHON ST VAR 4.0X15MM (Screw) ×12 IMPLANT
SCREW BN 15X4XST VA NS SPN (Screw) ×6 IMPLANT
SCREW SET END CAP (Screw) ×4 IMPLANT
SPONGE INTESTINAL PEANUT (DISPOSABLE) ×4 IMPLANT
SPONGE SURGIFOAM ABS GEL SZ50 (HEMOSTASIS) ×2 IMPLANT
STAPLER VISISTAT 35W (STAPLE) ×2 IMPLANT
STOCKINETTE 6  STRL (DRAPES) ×1
STOCKINETTE 6 STRL (DRAPES) ×1 IMPLANT
SUT STRATAFIX MNCRL+ 3-0 PS-2 (SUTURE) ×2
SUT STRATAFIX MONOCRYL 3-0 (SUTURE) ×2
SUT VIC AB 3-0 SH 8-18 (SUTURE) ×2 IMPLANT
SUTURE STRATFX MNCRL+ 3-0 PS-2 (SUTURE) ×2 IMPLANT
TOWEL OR 17X24 6PK STRL BLUE (TOWEL DISPOSABLE) ×6 IMPLANT
TOWEL OR 17X26 10 PK STRL BLUE (TOWEL DISPOSABLE) ×2 IMPLANT
TUBE CONNECTING 12X1/4 (SUCTIONS) ×2 IMPLANT
WATER STERILE IRR 1000ML POUR (IV SOLUTION) ×2 IMPLANT

## 2016-03-26 NOTE — Progress Notes (Signed)
Spoke with patient privately about positive drug screen on 1/3.  Patient had not smoked any mariajuana since Friday 1/6. And has not had any cocaine in about a week or so.

## 2016-03-26 NOTE — Anesthesia Preprocedure Evaluation (Addendum)
Anesthesia Evaluation  Patient identified by MRN, date of birth, ID band Patient awake    Reviewed: Allergy & Precautions, NPO status , Patient's Chart, lab work & pertinent test results  History of Anesthesia Complications Negative for: history of anesthetic complications  Airway Mallampati: II  TM Distance: >3 FB Neck ROM: Full    Dental  (+) Teeth Intact, Dental Advisory Given   Pulmonary Current Smoker,    breath sounds clear to auscultation       Cardiovascular hypertension,  Rhythm:Regular     Neuro/Psych    GI/Hepatic (+)     substance abuse  cocaine use and marijuana use,   Endo/Other    Renal/GU      Musculoskeletal   Abdominal   Peds  Hematology   Anesthesia Other Findings   Reproductive/Obstetrics                            Anesthesia Physical Anesthesia Plan  ASA: III  Anesthesia Plan: General   Post-op Pain Management:    Induction: Intravenous  Airway Management Planned: Oral ETT  Additional Equipment: Arterial line  Intra-op Plan: Utilization Of Total Body Hypothermia per surgeon request  Post-operative Plan: Extubation in OR  Informed Consent: I have reviewed the patients History and Physical, chart, labs and discussed the procedure including the risks, benefits and alternatives for the proposed anesthesia with the patient or authorized representative who has indicated his/her understanding and acceptance.   Dental advisory given  Plan Discussed with: CRNA, Anesthesiologist and Surgeon  Anesthesia Plan Comments:       Anesthesia Quick Evaluation

## 2016-03-26 NOTE — Consult Note (Signed)
Medical Consultation   CHANDRA FEGER  OZH:[id number]  DOB: 08/08/1965  DOA: 03/26/2016  PCP: No PCP Per Patient   Outpatient Specialists: none   Requesting physician: Dr. Bevely Palmer - Neurosurgery  Reason for consultation: HTN and general medical management.    History of Present Illness: Robert Carney is an 51 y.o. male presenting for elective spinal surgery performed by Dr Bevely Palmer. Pt noted to be hypertensive in pre-op. Previous history of HTN but has not taken medications for this in several years. Denies any active chest pain, SOB, palpitations, cough, fevers, abd pain, HA, n/v, neck stiffness, Leg swelling, dysuria, frequency. Pt undergoing surgery for C7 based myelopathy. Getting worse necessitating surgical correction. Currently in PACU pt only complaint is mild throat pain and incisional pain. Pt fairly groggy and unable to provide reliable history. Pt states he drinks a 12 pack of beer weekly and 1 pack of cigarettes weekly. Denies recent drug use.   Review of Systems:  ROS As per HPI otherwise 10 point review of systems negative.    Past Medical History: Past Medical History:  Diagnosis Date  . Hypertension    no longer taking meds    Past Surgical History: Past Surgical History:  Procedure Laterality Date  . tooth pulled       Allergies:   Allergies  Allergen Reactions  . No Known Allergies      Social History:  reports that he has been smoking Cigarettes.  He has never used smokeless tobacco. He reports that he drinks about 2.4 oz of alcohol per week . He reports that he uses drugs, including Marijuana.   Family History: Family History  Problem Relation Age of Onset  . Diabetes Mother   . Cancer Father      Physical Exam: Vitals:   03/26/16 1153 03/26/16 1215 03/26/16 1230 03/26/16 1300  BP:  136/75 130/80 119/71  Pulse: 60 74 61 (!) 59  Resp: 13 16 14 12   Temp:      SpO2: 100% 100% 100% 100%  Weight:        General:   Appears calm and comfortable Eyes:  PERRL, EOMI, normal lids, iris ENT:  grossly normal hearing, lips & tongue, mmm Neck:  no LAD, masses or thyromegaly Cardiovascular:  RRR, no m/r/g. No LE edema.  Respiratory:  CTA bilaterally, no w/r/r. Normal respiratory effort. Abdomen:  soft, ntnd, Hypoactive bowel sounds.  Skin: R anterior neck dressings c/d/i.  no rash or induration seen on limited exam Musculoskeletal:  grossly normal tone BUE/BLE, good ROM, no bony abnormality Psychiatric:  grossly normal mood and affect, speech fluent and appropriate, AOx3 Neurologic:  CN 2-12 grossly intact, moves all extremities in coordinated fashion, sensation intact  Data reviewed:  I have personally reviewed following labs and imaging studies Labs:  CBC:  Recent Labs Lab 03/20/16 2146  WBC 5.0  NEUTROABS 2.4  HGB 12.7*  HCT 38.5*  MCV 88.1  PLT 223    Basic Metabolic Panel:  Recent Labs Lab 03/20/16 2146  NA 136  K 4.1  CL 101  CO2 22  GLUCOSE 121*  BUN 9  CREATININE 0.96  CALCIUM 9.4   GFR Estimated Creatinine Clearance: 87.6 mL/min (by C-G formula based on SCr of 0.96 mg/dL). Liver Function Tests:  Recent Labs Lab 03/20/16 2146  AST 25  ALT 12*  ALKPHOS 41  BILITOT 0.6  PROT 7.5  ALBUMIN 3.9  No results for input(s): LIPASE, AMYLASE in the last 168 hours. No results for input(s): AMMONIA in the last 168 hours. Coagulation profile No results for input(s): INR, PROTIME in the last 168 hours.  Cardiac Enzymes: No results for input(s): CKTOTAL, CKMB, CKMBINDEX, TROPONINI in the last 168 hours. BNP: Invalid input(s): POCBNP CBG: No results for input(s): GLUCAP in the last 168 hours. D-Dimer No results for input(s): DDIMER in the last 72 hours. Hgb A1c No results for input(s): HGBA1C in the last 72 hours. Lipid Profile No results for input(s): CHOL, HDL, LDLCALC, TRIG, CHOLHDL, LDLDIRECT in the last 72 hours. Thyroid function studies No results for input(s): TSH,  T4TOTAL, T3FREE, THYROIDAB in the last 72 hours.  Invalid input(s): FREET3 Anemia work up No results for input(s): VITAMINB12, FOLATE, FERRITIN, TIBC, IRON, RETICCTPCT in the last 72 hours. Urinalysis    Component Value Date/Time   COLORURINE YELLOW 03/21/2016 0033   APPEARANCEUR CLEAR 03/21/2016 0033   LABSPEC 1.010 03/21/2016 0033   PHURINE 5.0 03/21/2016 0033   GLUCOSEU NEGATIVE 03/21/2016 0033   HGBUR NEGATIVE 03/21/2016 0033   BILIRUBINUR NEGATIVE 03/21/2016 0033   KETONESUR NEGATIVE 03/21/2016 0033   PROTEINUR NEGATIVE 03/21/2016 0033   NITRITE NEGATIVE 03/21/2016 0033   LEUKOCYTESUR NEGATIVE 03/21/2016 0033     Microbiology Recent Results (from the past 240 hour(s))  Surgical pcr screen     Status: Abnormal   Collection Time: 03/26/16  6:39 AM  Result Value Ref Range Status   MRSA, PCR NEGATIVE NEGATIVE Final   Staphylococcus aureus POSITIVE (A) NEGATIVE Final    Comment:        The Xpert SA Assay (FDA approved for NASAL specimens in patients over 721 years of age), is one component of a comprehensive surveillance program.  Test performance has been validated by [facility] for patients greater than or equal to 52 year old. It is not intended to diagnose infection nor to guide or monitor treatment.        Inpatient Medications:   Scheduled Meds: . Chlorhexidine Gluconate Cloth  6 each Topical Once   And  . Chlorhexidine Gluconate Cloth  6 each Topical Once  . hydrALAZINE      . hydrochlorothiazide  25 mg Oral Daily  . HYDROmorphone      . methocarbamol      . methocarbamol  750 mg Oral QID  . mupirocin ointment   Nasal BID  . oxyCODONE       Continuous Infusions: . sodium chloride 100 mL/hr at 03/26/16 1306     Radiological Exams on Admission: Dg Cervical Spine 2 Or 3 Views  Result Date: 03/26/2016 CLINICAL DATA:  C7 corpectomy. EXAM: CERVICAL SPINE - 2-3 VIEW; DG C-ARM 61-120 MIN COMPARISON:  MRI of the cervical spine 03/21/2016 FINDINGS: Two  intraoperative views are submitted. A C7 corpectomy is present. Metallic spacer is in place. Anterior plate and screw spans C6 through T1. Endotracheal tube and NG tube are noted. IMPRESSION: 1. C7 corpectomy with metallic spacer placed. 2. Anterior fusion plate [id number]. 3. AP alignment is anatomic. Electronically Signed   By: Marin Robertshristopher  Mattern M.D.   On: 03/26/2016 10:37   Dg C-arm 1-60 Min  Result Date: 03/26/2016 CLINICAL DATA:  C7 corpectomy. EXAM: CERVICAL SPINE - 2-3 VIEW; DG C-ARM 61-120 MIN COMPARISON:  MRI of the cervical spine 03/21/2016 FINDINGS: Two intraoperative views are submitted. A C7 corpectomy is present. Metallic spacer is in place. Anterior plate and screw spans C6 through T1. Endotracheal tube and  NG tube are noted. IMPRESSION: 1. C7 corpectomy with metallic spacer placed. 2. Anterior fusion plate [id number]. 3. AP alignment is anatomic. Electronically Signed   By: Marin Roberts M.D.   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 Bevely Palmer.  - Mgt per primary team  Polysubstance abuse: UDS from 03/21/16 + for cocaine. Denies recent use.  - Repeat UDS (given pts profound HTN need to know if actively w/ drugs in system)  ETOH use: drinks 12 pack beer weekly at baseline.  - CIWA  HTN: hypertensive urgency. Has not been on medications for years. Unsure of previous medications. Hydralazine 10mg  x2 w/ improvement. Pt w/ A-line in place. EKG shows - continue hydralazine prn - Start HCTZ, (may need second agent. No BBlocker given cocaine use) - BMET in am - Lipid panel - Trop, EKG  Hyperglycemia: likely stress related from surgery.  - A1c - CBG ACHS x24 hrs  Tobacco - Nicotine PRN  Nasal MRSA:  - intranasal mupirocin  Thank you for this consultation.  Our [facility] hospitalist team will  follow the patient with you.    Teran Daughenbaugh J M.D. [facility] 03/26/2016, 1:58 PM

## 2016-03-26 NOTE — Progress Notes (Signed)
Doing well Hypertensive Awake and alert Moving arms and legs well Incision looks good Awaiting Aspen collar Will consult hospitalist for assistance with blood pressure

## 2016-03-26 NOTE — H&P (Signed)
CC:  No chief complaint on file.   HPI: Robert Carney is a 51 y.o. male with cervical myelopathy presents for C7 corpectomy.  He denies changes since clinic.  PMH: Past Medical History:  Diagnosis Date  . Hypertension    no longer taking meds    PSH: Past Surgical History:  Procedure Laterality Date  . tooth pulled      SH: Social History  Substance Use Topics  . Smoking status: Current Some Day Smoker    Types: Cigarettes  . Smokeless tobacco: Never Used  . Alcohol use 2.4 oz/week    4 Cans of beer per week    MEDS: Prior to Admission medications   Not on File    ALLERGY: Allergies  Allergen Reactions  . No Known Allergies     ROS: Review of Systems  Constitutional: Negative.   HENT: Negative.   Eyes: Negative.   Respiratory: Negative.   Cardiovascular: Negative.   Gastrointestinal: Negative.   Genitourinary: Negative.   Musculoskeletal: Negative.   Skin: Negative.   Neurological: Positive for focal weakness.  Endo/Heme/Allergies: Negative.   Psychiatric/Behavioral: Negative.     NEUROLOGIC EXAM: Awake, alert, oriented Memory and concentration grossly intact Speech fluent, appropriate CN grossly intact Motor exam: Upper Extremities Deltoid Bicep Tricep Grip  Right 5/5 5/5 5/5 4-/5  Left 5/5 5/5 5/5 4-/5   Lower Extremity IP Quad PF DF EHL  Right 4/5 4/5 5/5 5/5 5/5  Left 4/5 4/5 5/5 5/5 5/5   Sensation grossly intact to LT  IMAGING: No new imaging  IMPRESSION: - 51 y.o. male with cervical spondylosis and myelopathy presents for C7 corpectomy.  His grip strength is worse since I saw him in clinic.  PLAN: - C7 corpectomy with C6 to T1 fixation and fusion - We have discussed the risks, benefits, and alternatives to surgery and he wishes to proceed.

## 2016-03-26 NOTE — Progress Notes (Signed)
Care of pt assumed by MA Kapono Luhn RN 

## 2016-03-26 NOTE — Anesthesia Procedure Notes (Signed)
Procedure Name: Intubation Date/Time: 03/26/2016 8:12 AM Performed by: Carney Living Pre-anesthesia Checklist: Patient identified, Emergency Drugs available, Suction available, Patient being monitored and Timeout performed Patient Re-evaluated:Patient Re-evaluated prior to inductionOxygen Delivery Method: Circle system utilized Preoxygenation: Pre-oxygenation with 100% oxygen Intubation Type: IV induction Ventilation: Mask ventilation without difficulty Laryngoscope Size: Mac and 4 Grade View: Grade I Tube type: Oral Tube size: 7.0 mm Number of attempts: 1 Airway Equipment and Method: Stylet Placement Confirmation: ETT inserted through vocal cords under direct vision,  positive ETCO2 and breath sounds checked- equal and bilateral Secured at: 21 cm Tube secured with: Tape Dental Injury: Teeth and Oropharynx as per pre-operative assessment

## 2016-03-26 NOTE — Brief Op Note (Signed)
03/26/2016  10:38 AM  PATIENT:  Robert Carney  51 y.o. male  PRE-OPERATIVE DIAGNOSIS:  Cervical stenosis  POST-OPERATIVE DIAGNOSIS:  Cervical stenosis  PROCEDURE:  Procedure(s) with comments: Cervical  Seven Anterior cervical corpectomy (N/A) - Cervical  Seven Anterior cervical corpectomy  SURGEON:  Surgeon(s) and Role:    * Truitt MerleBenjamin Jared Geni Skorupski, MD - Primary    * Donalee CitrinGary Cram, MD - Assisting  PHYSICIAN ASSISTANT:   ASSISTANTS: Donalee CitrinGary Cram, MD   ANESTHESIA:   general  EBL:  Total I/O In: 1000 [I.V.:1000] Out: 50 [Blood:50]  BLOOD ADMINISTERED:none  DRAINS: Medium hemovac   LOCAL MEDICATIONS USED:  MARCAINE    and LIDOCAINE   SPECIMEN:  No Specimen  DISPOSITION OF SPECIMEN:  N/A  COUNTS:  YES  TOURNIQUET:  * No tourniquets in log *  DICTATION: .Dragon Dictation  PLAN OF CARE: Admit to inpatient   PATIENT DISPOSITION:  PACU - hemodynamically stable.   Delay start of Pharmacological VTE agent (>24hrs) due to surgical blood loss or risk of bleeding: yes

## 2016-03-26 NOTE — Transfer of Care (Signed)
Immediate Anesthesia Transfer of Care Note  Patient: Robert Carney  Procedure(s) Performed: Procedure(s) with comments: Cervical  Seven Anterior cervical corpectomy (N/A) - Cervical  Seven Anterior cervical corpectomy  Patient Location: PACU  Anesthesia Type:General  Level of Consciousness: awake, alert , oriented and patient cooperative  Airway & Oxygen Therapy: Patient Spontanous Breathing and Patient connected to nasal cannula oxygen  Post-op Assessment: Report given to RN, Post -op Vital signs reviewed and stable and Patient moving all extremities X 4  Post vital signs: Reviewed and stable  Last Vitals:  Vitals:   03/26/16 0639 03/26/16 0700  BP: (!) 205/91 (!) 200/96  Pulse:    Resp:    Temp:      Last Pain:  Vitals:   03/26/16 0639  PainSc: 0-No pain         Complications: No apparent anesthesia complications

## 2016-03-26 NOTE — Progress Notes (Signed)
Dr Bevely Palmeritty here-aware elevated BP per cuff & arterial line, NSR 60.  New order for IV Hydralazine. Will continue to monitor.

## 2016-03-27 LAB — BASIC METABOLIC PANEL
Anion gap: 8 (ref 5–15)
BUN: 8 mg/dL (ref 6–20)
CO2: 26 mmol/L (ref 22–32)
Calcium: 9.3 mg/dL (ref 8.9–10.3)
Chloride: 104 mmol/L (ref 101–111)
Creatinine, Ser: 1 mg/dL (ref 0.61–1.24)
GFR calc Af Amer: >60 mL/min (ref >60)
GFR calc non Af Amer: >60 mL/min (ref >60)
Glucose, Bld: 102 mg/dL — ABNORMAL HIGH (ref 65–99)
Potassium: 3.8 mmol/L (ref 3.5–5.1)
Sodium: 138 mmol/L (ref 135–145)

## 2016-03-27 LAB — GLUCOSE, CAPILLARY
Glucose-Capillary: 95 mg/dL (ref 65–99)
Glucose-Capillary: 105 mg/dL — ABNORMAL HIGH (ref 65–99)

## 2016-03-27 LAB — CBC
HCT: 38.6 % — ABNORMAL LOW (ref 39.0–52.0)
Hemoglobin: 12.9 g/dL — ABNORMAL LOW (ref 13.0–17.0)
MCH: 29.3 pg (ref 26.0–34.0)
MCHC: 33.4 g/dL (ref 30.0–36.0)
MCV: 87.7 fL (ref 78.0–100.0)
Platelets: 276 10*3/uL (ref 150–400)
RBC: 4.4 MIL/uL (ref 4.22–5.81)
RDW: 13.1 % (ref 11.5–15.5)
WBC: 8.9 10*3/uL (ref 4.0–10.5)

## 2016-03-27 MED ORDER — AMLODIPINE BESYLATE 5 MG PO TABS
5.0000 mg | ORAL_TABLET | Freq: Every day | ORAL | Status: DC
  Administered 2016-03-27: 5 mg via ORAL
  Filled 2016-03-27: qty 1

## 2016-03-27 MED ORDER — AMLODIPINE BESYLATE 5 MG PO TABS: 5.0000 mg | ORAL_TABLET | Freq: Every day | ORAL | 2 refills | Status: DC

## 2016-03-27 MED ORDER — OXYCODONE-ACETAMINOPHEN 5-325 MG PO TABS: 1.0000 | ORAL_TABLET | ORAL | 0 refills | Status: DC | PRN

## 2016-03-27 MED ORDER — GABAPENTIN 300 MG PO CAPS: 300.0000 mg | ORAL_CAPSULE | Freq: Three times a day (TID) | ORAL | 2 refills | Status: DC

## 2016-03-27 MED ORDER — PANTOPRAZOLE SODIUM 40 MG PO TBEC: 40.0000 mg | DELAYED_RELEASE_TABLET | Freq: Every day | ORAL | Status: DC

## 2016-03-27 MED ORDER — HYDROCHLOROTHIAZIDE 25 MG PO TABS: 25.0000 mg | ORAL_TABLET | Freq: Every day | ORAL | 2 refills | Status: DC

## 2016-03-27 MED ORDER — METHOCARBAMOL 750 MG PO TABS: 750.0000 mg | ORAL_TABLET | Freq: Four times a day (QID) | ORAL | 2 refills | Status: DC | PRN

## 2016-03-27 NOTE — Evaluation (Signed)
Occupational Therapy Evaluation Patient Details Name: VERDIS KOVAL MRN: [id number] DOB: 08-12-1965 Today's Date: 03/27/2016    History of Present Illness Patient is a 51 y/ o male with hx of HTN presents with myelopathy and cerival stenosis s/p C7 corpectomy.   Clinical Impression   Pt educated in cervical precautions related to ADL and IADL. Functioning at modified independent level. No further OT needs.    Follow Up Recommendations  No OT follow up    Equipment Recommendations  None recommended by OT    Recommendations for Other Services       Precautions / Restrictions Precautions Precautions: Cervical Precaution Comments: educated in cervical and lifting precautions and reinforced with handout Required Braces or Orthoses: Cervical Brace Cervical Brace: Hard collar Restrictions Weight Bearing Restrictions: No      Mobility Bed Mobility Overal bed mobility: Modified Independent             General bed mobility comments: instructed to log roll  Transfers Overall transfer level: Needs assistance Equipment used: None Transfers: Sit to/from Stand Sit to Stand: Modified independent (Device/Increase time)              Balance Overall balance assessment: Modified Independent                                          ADL Overall ADL's : Modified independent                                             Vision     Perception     Praxis      Pertinent Vitals/Pain Pain Assessment: No/denies pain     Hand Dominance Right   Extremity/Trunk Assessment Upper Extremity Assessment Upper Extremity Assessment: Overall WFL for tasks assessed   Lower Extremity Assessment Lower Extremity Assessment: Overall WFL for tasks assessed       Communication Communication Communication: No difficulties (low volume)   Cognition Arousal/Alertness: Awake/alert Behavior During Therapy: WFL for tasks  assessed/performed Overall Cognitive Status: Within Functional Limits for tasks assessed                     General Comments       Exercises       Shoulder Instructions      Home Living Family/patient expects to be discharged to:: Private residence Living Arrangements: Parent Available Help at Discharge: Family;Available 24 hours/day Type of Home: House Home Access: Level entry     Home Layout: One level     Bathroom Shower/Tub: Chief Strategy Officer: Standard     Home Equipment: None          Prior Functioning/Environment Level of Independence: Independent                 OT Problem List: Decreased knowledge of precautions   OT Treatment/Interventions:      OT Goals(Current goals can be found in the care plan section) Acute Rehab OT Goals Patient Stated Goal: to get better  OT Frequency:     Barriers to D/C:            Co-evaluation              End of Session Equipment Utilized During Treatment:  Gait belt;Cervical collar  Activity Tolerance: Patient tolerated treatment well Patient left: in bed;with call bell/phone within reach   Time: 1450-1505 OT Time Calculation (min): 15 min Charges:  OT General Charges $OT Visit: 1 Procedure OT Evaluation $OT Eval Low Complexity: 1 Procedure G-Codes:    Evern BioMayberry, Kayce Chismar Lynn 03/27/2016, 3:08 PM  [phone]

## 2016-03-27 NOTE — Progress Notes (Signed)
Pt seen and examined. No issues overnight.  EXAM: Temp:  [98 F (36.7 C)-98.9 F (37.2 C)] 98.6 F (37 C) (01/09 0700) Pulse Rate:  [47-76] 59 (01/09 0700) Resp:  [10-19] 14 (01/09 0700) BP: (119-166)/(71-94) 166/74 (01/09 0700) SpO2:  [100 %] 100 % (01/09 0700) Arterial Line BP: (135-224)/(51-98) 135/81 (01/09 0700) Intake/Output      01/08 0701 - 01/09 0700 01/09 0701 - 01/10 0700   P.O. 460    I.V. (mL/kg) 2180 (32.1)    Total Intake(mL/kg) 2640 (38.8)    Urine (mL/kg/hr) 1300 (0.8) 1000 (2.7)   Drains 25 (0) 40 (0.1)   Blood 50 (0)    Total Output 1375 1040   Net +1265 -1040         Awake and alert Follows commands throughout Full strength  Stable D/c

## 2016-03-27 NOTE — Progress Notes (Signed)
Pt was discharged home. RN educated on medications and when to take them, when to follow up with the Md, when to call 911, and information on hypertension. Pt discharged by wheelchair with RN.

## 2016-03-27 NOTE — Discharge Summary (Signed)
Date of Admission: 03/26/2016  Date of Discharge: 03/27/16  Admission Diagnosis: Cervical spondylosis with myelopathy  Discharge Diagnosis: Same  Procedure Performed: C7 corpectomy with C6-T1 anterior plating and fusion  Attending: Truitt MerleBenjamin Jared Ditty, MD  Hospital Course:  The patient was admitted for the above listed operation and had an uncomplicated post-operative course.  They were discharged in stable condition.  Follow up: 3 weeks  Allergies as of 03/27/2016      Reactions   No Known Allergies       Medication List    TAKE these medications   amLODipine 5 MG tablet Commonly known as:  NORVASC Take 1 tablet (5 mg total) by mouth daily.   gabapentin 300 MG capsule Commonly known as:  NEURONTIN Take 1 capsule (300 mg total) by mouth 3 (three) times daily.   hydrochlorothiazide 25 MG tablet Commonly known as:  HYDRODIURIL Take 1 tablet (25 mg total) by mouth daily. Start taking on:  03/28/2016   methocarbamol 750 MG tablet Commonly known as:  ROBAXIN Take 1 tablet (750 mg total) by mouth every 6 (six) hours as needed for muscle spasms.   oxyCODONE-acetaminophen 5-325 MG tablet Commonly known as:  ROXICET Take 1-2 tablets by mouth every 4 (four) hours as needed for severe pain.

## 2016-03-27 NOTE — Care Management Note (Addendum)
Case Management Note  Patient Details  Name: Robert Carney L Mika MRN: [id number] Date of Birth: [birth year]  Subjective/Objective:       s/p C7 corpectomy with C6-T1 anterior plating and fusion, lives with mother, she will be transporting him home today at dc.  Per pt eval no pt f/u needed, patient states he does not have a pcp, NCM called number on back of patient 's insurance card and pulled up doctors in network, [facility], he states he does not want to make apt right now, but he will call to make an apt with the one he chooses.                   Action/Plan:   Expected Discharge Date:                  Expected Discharge Plan:  Home/Self Care  In-House Referral:     Discharge planning Services  CM Consult  Post Acute Care Choice:    Choice offered to:     DME Arranged:    DME Agency:     HH Arranged:    HH Agency:     Status of Service:  Completed, signed off  If discussed at [employer] Length of Stay Meetings, dates discussed:    Additional Comments:  Leone Havenaylor, Izaha Shughart Clinton, RN 03/27/2016, 2:49 PM

## 2016-03-27 NOTE — Evaluation (Signed)
Physical Therapy Evaluation Patient Details Name: Robert Carney L Steven MRN: [id number] DOB: Dec 02, 1965 Today's Date: 03/27/2016   History of Present Illness  Patient is a 51 y/ o male with hx of HTN presents with myelopathy and cerival stenosis s/p C7 corpectomy.  Clinical Impression  Patient presents with post surgical deficits and mild ataxia BLEs s/p above. Tolerated gait training Mod I without assistive device with no evidence of LOB or difficulty. Education re: cervical precautions, collar, positioning etc. Encouraged ambulation 3-4 times/day with nursing. Pt has support from mother at home. Pt does not require skilled therapy services. All education completed. Discharge from therapy.     Follow Up Recommendations No PT follow up;Supervision - Intermittent    Equipment Recommendations  None recommended by PT    Recommendations for Other Services       Precautions / Restrictions Precautions Precautions: None Required Braces or Orthoses: Cervical Brace Cervical Brace: Hard collar Restrictions Weight Bearing Restrictions: No      Mobility  Bed Mobility               General bed mobility comments: Up in chair upon PT arrival.  Transfers Overall transfer level: Needs assistance Equipment used: None Transfers: Sit to/from Stand Sit to Stand: Modified independent (Device/Increase time)         General transfer comment: Stood from chair x1. No dizziness or difficulty.   Ambulation/Gait Ambulation/Gait assistance: Modified independent (Device/Increase time) Ambulation Distance (Feet): 250 Feet Assistive device: None Gait Pattern/deviations: Step-through pattern;Decreased stride length   Gait velocity interpretation: at or above normal speed for age/gender General Gait Details: Mild ataxia noted BLEs during gait but no overt LOB. Reports similar to premorbid. + cough.   Stairs            Wheelchair Mobility    Modified Rankin (Stroke Patients Only)        Balance Overall balance assessment: Modified Independent                                           Pertinent Vitals/Pain Pain Assessment: No/denies pain    Home Living Family/patient expects to be discharged to:: Private residence Living Arrangements: Parent Available Help at Discharge: Family;Available 24 hours/day Type of Home: House Home Access: Level entry     Home Layout: One level Home Equipment: None      Prior Function Level of Independence: Independent               Hand Dominance   Dominant Hand: Right    Extremity/Trunk Assessment   Upper Extremity Assessment Upper Extremity Assessment: Defer to OT evaluation    Lower Extremity Assessment Lower Extremity Assessment: Overall WFL for tasks assessed (Some incoordination noted BLEs.)    Cervical / Trunk Assessment Cervical / Trunk Assessment: Other exceptions Cervical / Trunk Exceptions: s/p cervical spine surgery, adjusted collar.   Communication   Communication: No difficulties  Cognition Arousal/Alertness: Awake/alert Behavior During Therapy: WFL for tasks assessed/performed Overall Cognitive Status: Within Functional Limits for tasks assessed                      General Comments General comments (skin integrity, edema, etc.): Hemovac intact. Incision clean, dry.    Exercises     Assessment/Plan    PT Assessment Patent does not need any further PT services  PT Problem List  PT Treatment Interventions      PT Goals (Current goals can be found in the Care Plan section)  Acute Rehab PT Goals Patient Stated Goal: to get better PT Goal Formulation: All assessment and education complete, DC therapy    Frequency     Barriers to discharge        Co-evaluation               End of Session Equipment Utilized During Treatment: Gait belt Activity Tolerance: Patient tolerated treatment well Patient left: in chair;with call bell/phone within  reach Nurse Communication: Mobility status         Time: 4098-1191 PT Time Calculation (min) (ACUTE ONLY): 16 min   Charges:   PT Evaluation $PT Eval Low Complexity: 1 Procedure     PT G Codes:        Miel Wisener A Arionne Iams 03/27/2016, 9:31 AM Mylo Red, PT, DPT [phone]

## 2016-03-27 NOTE — Clinical Social Work Note (Signed)
CSW acknowledges SNF consult. PT recommended no PT follow up.  CSW signing off. Patient discharging home today.  Charlynn CourtSarah Eshawn Coor, CSW [phone]

## 2016-03-28 NOTE — Anesthesia Postprocedure Evaluation (Addendum)
Anesthesia Post Note  Patient: Robert Carney  Procedure(s) Performed: Procedure(s) (LRB): Cervical  Seven Anterior cervical corpectomy (N/A)  Patient location during evaluation: PACU Anesthesia Type: General Level of consciousness: awake and alert Pain management: pain level controlled Vital Signs Assessment: post-procedure vital signs reviewed and stable Respiratory status: spontaneous breathing, respiratory function stable and patient connected to nasal cannula oxygen Cardiovascular status: stable Postop Assessment: no signs of nausea or vomiting Anesthetic complications: no       Last Vitals:  Vitals:   03/27/16 1232 03/27/16 1531  BP: (!) 174/98 (!) 166/93  Pulse:  (!) 56  Resp:  14  Temp:  37.3 C    Last Pain:  Vitals:   03/27/16 1531  TempSrc: Oral  PainSc:                  [name]

## 2016-03-30 ENCOUNTER — Encounter (HOSPITAL_COMMUNITY): Payer: Self-pay | Admitting: Neurological Surgery

## 2016-03-30 NOTE — Op Note (Signed)
03/26/2016  11:13 AM  PATIENT:  Robert Carney  51 y.o. male  PRE-OPERATIVE DIAGNOSIS:  Cervical spondylosis with myelopathy; C6-7 and C7-T1 spondylolisthesis  POST-OPERATIVE DIAGNOSIS:  Same  PROCEDURE:  C7 corpectomy, insertion of expandable cage, C6-T1 plate fixation and fusion  SURGEON:  Hulan SaasBenjamin J. Canio Winokur, MD  ASSISTANTS: Donalee CitrinGary Cram, MD  ANESTHESIA:   General  DRAINS: Medium hemovac   SPECIMEN:  None  INDICATION FOR PROCEDURE: 51 year old male with cervical myelopathy, progressive lower extremity weakness.  I recommended the above operation. Patient understood the risks, benefits, and alternatives and potential outcomes and wished to proceed.  PROCEDURE DETAILS: Patient was brought to the operating room placed under general endotracheal anesthesia. Patient was placed in the supine position on the operating room table. The neck was prepped with betadine and chloraprep and draped in a sterile fashion.   Local anesthestic was injected and a transverse incision was made on the right side of the neck. The skin was undermined and the platysma was opened vertically. Dissection was then carried out thru an avascular plane leaving the sternocleidomastoid, carotid artery, and jugular vein laterally and the trachea and esophagus medially. The ventral aspect of the vertebral column was identified and a localizing x-ray was taken. The C7 level was identified. The longus colli muscles were then elevated and the retractor was placed. The annulus above and below was incised and the disc space entered. Discectomy was performed with micro-curettes and pituitary and kerrison rongeurs. I then used the high-speed drill to drill the endplates down to the level of the posterior longitudinal ligament. The operating microscope was draped and brought into the field provided additional magnification, illumination and visualization. Utilizing microsurgical technique, discectomy was continued posteriorly thru  the disc space. I then resected the vertebral body of C6 with a Leksell rongeur and the high speed drill. When I encountered the dorsal cortical wall of the vertebral body I drilled this to egg shell thinness and resected it with Kerrison rongeurs. I opened the posterior longitudinal ligament above and below the corpectomy and resected the PLL from the bottom of C6 to the top of T1.  I then measured the interbody space and constructed an expandable titanium cage. This was packed with morsellized allograft and autograft and inserted into the defect. I then used an Archon R corpectomy plate and placed three variable angle screws into the C6 vertebral body and three more into the T1 vertebral body and locked them into position. The wound was irrigated with bacitracin solution, checked for hemostasis which was established and confirmed. Once meticulous hemostasis was achieved, we then proceeded with closure. A medium hemovac drain was placed.  The platysma was closed with interrupted 3-0 undyed Vicryl suture, the subcuticular layer was closed with running monocryl suture. The skin edges were approximated with dermabond. The drapes were removed. A sterile dressing was applied. The patient was then awakened from general anesthesia and transferred to the recovery room in stable condition. At the end of the procedure all sponge, needle and instrument counts were correct.  PATIENT DISPOSITION:  PACU - hemodynamically stable.   Delay start of Pharmacological VTE agent (>24hrs) due to surgical blood loss or risk of bleeding:  yes

## 2016-04-03 MED FILL — Thrombin For Soln 5000 Unit: CUTANEOUS | Qty: 5000 | Status: AC

## 2016-08-20 NOTE — Addendum Note (Signed)
Addendum  created 08/20/16 0958 by Aspen Deterding, MD   Sign clinical note    

## 2016-09-26 ENCOUNTER — Ambulatory Visit: Payer: PRIVATE HEALTH INSURANCE | Admitting: Neurology

## 2016-10-01 ENCOUNTER — Telehealth: Payer: Self-pay | Admitting: Hematology and Oncology

## 2016-10-01 NOTE — Telephone Encounter (Signed)
Appt has been scheduled for the pt to see Dr. Gweneth DimitriPerlov on 7/17 at 1pm. Pt aware to arrive 30 minutes early. Address verified. Pt stated that he no longer has [employer].

## 2016-10-02 ENCOUNTER — Telehealth: Payer: Self-pay | Admitting: Hematology and Oncology

## 2016-10-02 ENCOUNTER — Ambulatory Visit (HOSPITAL_BASED_OUTPATIENT_CLINIC_OR_DEPARTMENT_OTHER): Payer: No Typology Code available for payment source

## 2016-10-02 ENCOUNTER — Encounter: Payer: Self-pay | Admitting: Hematology and Oncology

## 2016-10-02 ENCOUNTER — Ambulatory Visit (HOSPITAL_BASED_OUTPATIENT_CLINIC_OR_DEPARTMENT_OTHER): Payer: No Typology Code available for payment source | Admitting: Hematology and Oncology

## 2016-10-02 VITALS — BP 141/80 | HR 80 | Temp 98.8°F | Resp 18 | Ht 72.0 in | Wt 146.0 lb

## 2016-10-02 DIAGNOSIS — R779 Abnormality of plasma protein, unspecified: Secondary | ICD-10-CM

## 2016-10-02 DIAGNOSIS — E8809 Other disorders of plasma-protein metabolism, not elsewhere classified: Secondary | ICD-10-CM

## 2016-10-02 DIAGNOSIS — F1010 Alcohol abuse, uncomplicated: Secondary | ICD-10-CM

## 2016-10-02 DIAGNOSIS — Z720 Tobacco use: Secondary | ICD-10-CM

## 2016-10-02 LAB — CBC & DIFF AND RETIC
BASO%: 0.5 % (ref 0.0–2.0)
Basophils Absolute: 0 10*3/uL (ref 0.0–0.1)
EOS%: 1.4 % (ref 0.0–7.0)
Eosinophils Absolute: 0.1 10*3/uL (ref 0.0–0.5)
HCT: 39.5 % (ref 38.4–49.9)
HGB: 13.1 g/dL (ref 13.0–17.1)
Immature Retic Fract: 4.3 % (ref 3.00–10.60)
LYMPH%: 30.8 % (ref 14.0–49.0)
MCH: 29.2 pg (ref 27.2–33.4)
MCHC: 33.2 g/dL (ref 32.0–36.0)
MCV: 88 fL (ref 79.3–98.0)
MONO#: 0.4 10*3/uL (ref 0.1–0.9)
MONO%: 7.4 % (ref 0.0–14.0)
NEUT#: 3.4 10*3/uL (ref 1.5–6.5)
NEUT%: 59.9 % (ref 39.0–75.0)
Platelets: 271 10*3/uL (ref 140–400)
RBC: 4.49 10*6/uL (ref 4.20–5.82)
RDW: 13.8 % (ref 11.0–14.6)
Retic %: 0.92 % (ref 0.80–1.80)
Retic Ct Abs: 41.31 10*3/uL (ref 34.80–93.90)
WBC: 5.7 10*3/uL (ref 4.0–10.3)
lymph#: 1.8 10*3/uL (ref 0.9–3.3)
nRBC: 0 % (ref 0–0)

## 2016-10-02 LAB — COMPREHENSIVE METABOLIC PANEL
ALT: 13 U/L (ref 0–55)
AST: 23 U/L (ref 5–34)
Albumin: 4.6 g/dL (ref 3.5–5.0)
Alkaline Phosphatase: 52 U/L (ref 40–150)
Anion Gap: 14 mEq/L — ABNORMAL HIGH (ref 3–11)
BUN: 12.2 mg/dL (ref 7.0–26.0)
CO2: 28 mEq/L (ref 22–29)
Calcium: 10.2 mg/dL (ref 8.4–10.4)
Chloride: 99 mEq/L (ref 98–109)
Creatinine: 1.2 mg/dL (ref 0.7–1.3)
EGFR: 84 mL/min/{1.73_m2} — ABNORMAL LOW (ref >90)
Glucose: 85 mg/dl (ref 70–140)
Potassium: 3.7 mEq/L (ref 3.5–5.1)
Sodium: 140 mEq/L (ref 136–145)
Total Bilirubin: 0.4 mg/dL (ref 0.20–1.20)
Total Protein: 9 g/dL — ABNORMAL HIGH (ref 6.4–8.3)

## 2016-10-02 LAB — LACTATE DEHYDROGENASE: LDH: 159 U/L (ref 125–245)

## 2016-10-02 LAB — MORPHOLOGY: PLT EST: ADEQUATE

## 2016-10-02 NOTE — Progress Notes (Signed)
[facility] New Visit:  Assessment: Elevated blood protein 51 year old male with history of peripheral neuropathy in the context of cervical spondylosis and myelopathy who is referred to our clinic for elevated protein levels in absence of evidence of monoclonal protein on the serum protein electrophoresis.  Review of his history is significant for alcohol abuse and reported polysubstance abuse, although patient is denying previous history of IV drug use. Patient does have extensive family history of malignancy with rest cancer in male relatives including sister and maternal aunt and his daughter with a hematological malignancy that family does not know details of.  At this time, it is unclear the elevated protein value is representative of a monoclonal process such as multiple myeloma or Waldenstrm's macroglobulinemia or a poly-clonal process such as inflammatory reaction to infection or autoimmune condition. We will start our evaluation by repeating serum protein electrophoresis and obtaining additional studies to rule out hematological malignancy.  Plan: --Skeletal survey --Labs as outlined below --RTC in one week to review and to continue evaluation  Voice recognition software was used and creation of this note. Despite my best effort at editing the text, some misspelling/errors may have occurred.   Orders Placed This Encounter  Procedures  . DG Bone Survey Met    Standing Status:   Future    Standing Expiration Date:   12/03/2017    Order Specific Question:   Reason for Exam (SYMPTOM  OR DIAGNOSIS REQUIRED)    Answer:   Dysproteinemia, eval for lytic skeletal lesions to suggest possible myeloma    Order Specific Question:   Preferred imaging location?    Answer:   [facility]    Order Specific Question:   Radiology Contrast Protocol - do NOT remove file path    Answer:   [email or web address]  . CBC & Diff and  Retic    Standing Status:   Future    Number of Occurrences:   1    Standing Expiration Date:   10/02/2017  . Morphology    Standing Status:   Future    Number of Occurrences:   1    Standing Expiration Date:   10/02/2017  . Lactate dehydrogenase (LDH)    Standing Status:   Future    Number of Occurrences:   1    Standing Expiration Date:   10/02/2017  . Comprehensive metabolic panel    Standing Status:   Future    Number of Occurrences:   1    Standing Expiration Date:   10/02/2017  . Multiple Myeloma Panel (SPEP&IFE w/QIG)    Standing Status:   Future    Number of Occurrences:   1    Standing Expiration Date:   10/02/2017  . Viscosity, serum    Standing Status:   Future    Number of Occurrences:   1    Standing Expiration Date:   10/02/2017  . Kappa/lambda light chains    Standing Status:   Future    Number of Occurrences:   1    Standing Expiration Date:   10/02/2017  . 24 Hr Ur UIFE/Light chains/TP Qnt    Standing Status:   Future    Number of Occurrences:   1    Standing Expiration Date:   10/02/2017  . Beta 2 microglobulin    Standing Status:   Future    Number of Occurrences:   1    Standing Expiration Date:   10/02/2017    All questions  were answered.  . The patient knows to call the clinic with any problems, questions or concerns.  This note was electronically signed.    History of Presenting Illness Robert Carney 51 y.o. presenting to the [facility] for Evaluation after discovery of elevated protein levels on routine lab work. Please see below in the oncological/hematological history for details. At the present time, complaints are those regarding voice weakness that has been present since he underwent surgery on his cervical spine due to myelopathy and nerve root compression. He initially presented to the attention of the physicians with 2-3 months of paresthesias, numbness, and progressive weakness in bilateral lower extremities tilting and difficulties with  ambulation and falling. Evaluation revealed cervical spondylosis and myelopathy and patient underwent surgery in January 2018. It appears that the weakness in the lower extremities is slowly improving. Nevertheless, patient does complain of persistent and stable dysphonia. He denies vision changes, swallowing difficulties. His upper extremities do have some weakness especially on the left side, but dexterity is generally preserved. He denies chest pain, shortness of breath or cough. Denies any pain in the muscles or bones. Denies any nausea, vomiting, abdominal pain, loss of appetite, diarrhea, or constipation. No dysuria or hematuria.  Patient does have a chronic alcohol abuse history drinking at least a sixpack of beer per day. He does smoke using one or less packs per week. Denies history of IV drug use, denies previous exposure to hepatitis C or HIV. No previous history of blood transfusions.  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 Hypertension    no longer taking meds    Surgical History: Past Surgical History:  Procedure Laterality Date  . ANTERIOR CERVICAL CORPECTOMY N/A 03/26/2016   Procedure: Cervical  Seven Anterior cervical corpectomy;  Surgeon: Kevan Ny Ditty, MD;  Location: [facility];  Service: Neurosurgery;  Laterality: N/A;  Cervical  Seven Anterior cervical corpectomy  . tooth pulled      Family History: Family History  Problem Relation Age of Onset  . Diabetes Mother   . Lung cancer Mother   . Prostate cancer Father   . Breast cancer Sister   . Breast cancer Maternal Aunt     Social History: Social History   Social History  . Marital status: Single    Spouse name: N/A  . Number of children: N/A  . Years of  education: N/A   Occupational History  . Not on file.   Social History Main Topics  . Smoking status: Current Some Day Smoker    Packs/day: 0.20    Types: Cigarettes  . Smokeless tobacco: Never Used  . Alcohol use 25.2 oz/week    42 Cans of beer per week     Comment: Daily beer drinker  . Drug use: Yes    Types: Marijuana     Comment: No history of IV drug use.  Marland Kitchen Sexual activity: Not on file   Other Topics Concern  . Not on file   Social History Narrative  . No narrative on file    Allergies: Allergies  Allergen Reactions  . No Known Allergies     Medications:  Current Outpatient Prescriptions  Medication Sig Dispense Refill  . amLODipine (NORVASC) 5 MG tablet Take 1 tablet (5 mg total) by  mouth daily. 30 tablet 2  . gabapentin (NEURONTIN) 300 MG capsule Take 1 capsule (300 mg total) by mouth 3 (three) times daily. 90 capsule 2  . hydrochlorothiazide (HYDRODIURIL) 25 MG tablet Take 1 tablet (25 mg total) by mouth daily. 30 tablet 2   No current facility-administered medications for this visit.     Review of Systems: Review of Systems  Constitutional: Positive for fatigue. Negative for appetite change, chills, diaphoresis and fever.       Patient does indicate occasional night sweats where he does need to change his clothes and pillowcase.  HENT:   Positive for voice change. Negative for hearing loss, lump/mass, mouth sores, nosebleeds, sore throat and trouble swallowing.        Patient reports the voice change that has started following the surgery conducted earlier this year for C-spine problems  Eyes: Negative for eye problems and icterus.  Respiratory: Negative for chest tightness, cough, hemoptysis, shortness of breath and wheezing.   Cardiovascular: Negative for chest pain, leg swelling and palpitations.  Gastrointestinal: Negative for abdominal distention, abdominal pain, constipation, diarrhea, nausea and vomiting.  Endocrine: Negative for hot flashes.   Genitourinary: Negative for difficulty urinating, dysuria and hematuria.   Musculoskeletal: Positive for gait problem. Negative for arthralgias, back pain, myalgias, neck pain and neck stiffness.  Skin: Negative for itching and rash.  Neurological: Positive for extremity weakness, gait problem and numbness. Negative for dizziness, headaches, light-headedness, seizures and speech difficulty.       Gait problems preceded his surgical intervention. They appear to be improving slowly since the surgery. Come previously patient had significant difficulties maintaining balance and strength in the lower extremities.  Presently, he continues to experience numbness and paresthesias in bilateral lower extremities to the level of the knees.  Hematological: Negative for adenopathy. Does not bruise/bleed easily.     PHYSICAL EXAMINATION Blood pressure (!) 141/80, pulse 80, temperature 98.8 F (37.1 C), temperature source Oral, resp. rate 18, height 6' (1.829 m), weight 146 lb (66.2 kg), SpO2 100 %.  ECOG PERFORMANCE STATUS: 2 - Symptomatic, <50% confined to bed  Physical Exam  Constitutional: He is oriented to person, place, and time. No distress.  Thin African-American male in no apparent distress.  HENT:  Head: Atraumatic.  Mouth/Throat: Oropharynx is clear and moist. No oropharyngeal exudate.  Patient does have weakness in his voice/dysphonia without significant dysarthria.  Eyes: Pupils are equal, round, and reactive to light. Conjunctivae and EOM are normal. No scleral icterus.  Neck: No thyromegaly present.  Cardiovascular: Normal rate, regular rhythm, normal heart sounds and intact distal pulses.  Exam reveals no gallop and no friction rub.   No murmur heard. Pulmonary/Chest: Effort normal and breath sounds normal. No stridor. No respiratory distress. He has no wheezes. He has no rales.  Abdominal: Soft. Bowel sounds are normal. He exhibits no distension and no mass. There is no tenderness.  There is no guarding.  Musculoskeletal: He exhibits no edema.  Lymphadenopathy:    He has no cervical adenopathy.  Neurological: He is alert and oriented to person, place, and time.  Right upper extremity strength 5/5, left upper extremity 4+/5; strength in the lower extremities appears to be symmetric, with mild weakness to dorsiflexion as well as knee flexion, but preserved strength of the hip flexion and knee extension. Deep tendon reflexes normal in the upper and lower extremities, no clonus present.  Skin: Skin is warm and dry. No rash noted. He is not diaphoretic. No pallor.  LABORATORY DATA: I have personally reviewed the data as listed: Appointment on 10/02/2016  Component Date Value Ref Range Status  . WBC 10/02/2016 5.7  4.0 - 10.3 10e3/uL Final  . NEUT# 10/02/2016 3.4  1.5 - 6.5 10e3/uL Final  . HGB 10/02/2016 13.1  13.0 - 17.1 g/dL Final  . HCT 10/02/2016 39.5  38.4 - 49.9 % Final  . Platelets 10/02/2016 271  140 - 400 10e3/uL Final  . MCV 10/02/2016 88.0  79.3 - 98.0 fL Final  . MCH 10/02/2016 29.2  27.2 - 33.4 pg Final  . MCHC 10/02/2016 33.2  32.0 - 36.0 g/dL Final  . RBC 10/02/2016 4.49  4.20 - 5.82 10e6/uL Final  . RDW 10/02/2016 13.8  11.0 - 14.6 % Final  . lymph# 10/02/2016 1.8  0.9 - 3.3 10e3/uL Final  . MONO# 10/02/2016 0.4  0.1 - 0.9 10e3/uL Final  . Eosinophils Absolute 10/02/2016 0.1  0.0 - 0.5 10e3/uL Final  . Basophils Absolute 10/02/2016 0.0  0.0 - 0.1 10e3/uL Final  . NEUT% 10/02/2016 59.9  39.0 - 75.0 % Final  . LYMPH% 10/02/2016 30.8  14.0 - 49.0 % Final  . MONO% 10/02/2016 7.4  0.0 - 14.0 % Final  . EOS% 10/02/2016 1.4  0.0 - 7.0 % Final  . BASO% 10/02/2016 0.5  0.0 - 2.0 % Final  . nRBC 10/02/2016 0  0 - 0 % Final  . Retic % 10/02/2016 0.92  0.80 - 1.80 % Final  . Retic Ct Abs 10/02/2016 41.31  34.80 - 93.90 10e3/uL Final  . Immature Retic Fract 10/02/2016 4.30  3.00 - 10.60 % Final  . Tear Drop Cells 10/02/2016 Few  Negative Final  . Ovalocytes  10/02/2016 Few  Negative Final  . Target Cells 10/02/2016 Few  Negative Final  . White Cell Comments 10/02/2016 C/W auto diff   Final  . PLT EST 10/02/2016 Adequate  Adequate Final  . LDH 10/02/2016 159  125 - 245 U/L Final  . Sodium 10/02/2016 140  136 - 145 mEq/L Final  . Potassium 10/02/2016 3.7  3.5 - 5.1 mEq/L Final  . Chloride 10/02/2016 99  98 - 109 mEq/L Final  . CO2 10/02/2016 28  22 - 29 mEq/L Final  . Glucose 10/02/2016 85  70 - 140 mg/dl Final   Glucose reference range is for nonfasting patients. Fasting glucose reference range is 70- 100.  Marland Kitchen BUN 10/02/2016 12.2  7.0 - 26.0 mg/dL Final  . Creatinine 10/02/2016 1.2  0.7 - 1.3 mg/dL Final  . Total Bilirubin 10/02/2016 0.40  0.20 - 1.20 mg/dL Final  . Alkaline Phosphatase 10/02/2016 52  40 - 150 U/L Final  . AST 10/02/2016 23  5 - 34 U/L Final  . ALT 10/02/2016 13  0 - 55 U/L Final  . Total Protein 10/02/2016 9.0* 6.4 - 8.3 g/dL Final  . Albumin 10/02/2016 4.6  3.5 - 5.0 g/dL Final  . Calcium 10/02/2016 10.2  8.4 - 10.4 mg/dL Final  . Anion Gap 10/02/2016 14* 3 - 11 mEq/L Final  . EGFR 10/02/2016 84* >90 ml/min/1.73 m2 Final   eGFR is calculated using the CKD-EPI Creatinine Equation (2009)         Ardath Sax, MD

## 2016-10-02 NOTE — Patient Instructions (Signed)
Thank you for choosing [facility] to provide your oncology and hematology care.  To afford each patient quality time with our providers, please arrive 30 minutes before your scheduled appointment time.  If you arrive late for your appointment, you may be asked to reschedule.  We strive to give you quality time with our providers, and arriving late affects you and other patients whose appointments are after yours.   If you are a no show for multiple scheduled visits, you may be dismissed from the clinic at the providers discretion.    Again, thank you for choosing [facility], our hope is that these requests will decrease the amount of time that you wait before being seen by our physicians.  ______________________________________________________________________  Should you have questions after your visit to the [facility], please contact our office at [phone] between the hours of 8:30 and 4:30 p.m.    Voicemails left after 4:30p.m will not be returned until the following business day.    For prescription refill requests, please have your pharmacy contact us directly.  Please also try to allow 48 hours for prescription requests.    Please contact the scheduling department for questions regarding scheduling.  For scheduling of procedures such as PET scans, CT scans, MRI, Ultrasound, etc please contact central scheduling at [phone].    Resources For Cancer Patients and Caregivers:   Oncolink.org:  A wonderful resource for patients and healthcare providers for information regarding your disease, ways to tract your treatment, what to expect, etc.     American Cancer Society:  800-227-2345  Can help patients locate various types of support and financial assistance  Cancer Care: 1-800-813-HOPE (4673) Provides financial assistance, online support groups, medication/co-pay assistance.    [place] DSS:  336-641-3447 Where to apply for food  stamps, Medicaid, and utility assistance  Medicare Rights Center: 800-333-4114 Helps people with Medicare understand their rights and benefits, navigate the Medicare system, and secure the quality healthcare they deserve  SCAT: 336-333-6589 [place] [employer]'s shared-ride transportation service for eligible riders who have a disability that prevents them from riding the fixed route bus.    For additional information on assistance programs please contact our social worker:   Grier Hock/Abigail Elmore:  [phone]            

## 2016-10-02 NOTE — Assessment & Plan Note (Signed)
51 year old male with history of peripheral neuropathy in the context of cervical spondylosis and myelopathy who is referred to our clinic for elevated protein levels in absence of evidence of monoclonal protein on the serum protein electrophoresis.  Review of his history is significant for alcohol abuse and reported polysubstance abuse, although patient is denying previous history of IV drug use. Patient does have extensive family history of malignancy with rest cancer in male relatives including sister and maternal aunt and his daughter with a hematological malignancy that family does not know details of.  At this time, it is unclear the elevated protein value is representative of a monoclonal process such as multiple myeloma or Waldenstrm's macroglobulinemia or a poly-clonal process such as inflammatory reaction to infection or autoimmune condition. We will start our evaluation by repeating serum protein electrophoresis and obtaining additional studies to rule out hematological malignancy.  Plan: --Skeletal survey --Labs as outlined below --RTC in one week to review and to continue evaluation  Voice recognition software was used and creation of this note. Despite my best effort at editing the text, some misspelling/errors may have occurred.

## 2016-10-02 NOTE — Telephone Encounter (Signed)
Scheduled appt per 7/17 los - Gave patient AVS and calender per los.  

## 2016-10-03 LAB — KAPPA/LAMBDA LIGHT CHAINS
Ig Kappa Free Light Chain: 24.9 mg/L — ABNORMAL HIGH (ref 3.3–19.4)
Ig Lambda Free Light Chain: 18.2 mg/L (ref 5.7–26.3)
Kappa/Lambda FluidC Ratio: 1.37 (ref 0.26–1.65)

## 2016-10-03 LAB — BETA 2 MICROGLOBULIN, SERUM: Beta-2: 1.9 mg/L (ref 0.6–2.4)

## 2016-10-05 LAB — MULTIPLE MYELOMA PANEL, SERUM
Albumin SerPl Elph-Mcnc: 4.3 g/dL (ref 2.9–4.4)
Albumin/Glob SerPl: 1.1 (ref 0.7–1.7)
Alpha 1: 0.2 g/dL (ref 0.0–0.4)
Alpha2 Glob SerPl Elph-Mcnc: 0.9 g/dL (ref 0.4–1.0)
B-Globulin SerPl Elph-Mcnc: 1.3 g/dL (ref 0.7–1.3)
Gamma Glob SerPl Elph-Mcnc: 1.7 g/dL (ref 0.4–1.8)
Globulin, Total: 4.1 g/dL — ABNORMAL HIGH (ref 2.2–3.9)
IgA, Qn, Serum: 267 mg/dL (ref 90–386)
IgG, Qn, Serum: 1683 mg/dL — ABNORMAL HIGH (ref 700–1600)
IgM, Qn, Serum: 66 mg/dL (ref 20–172)
Total Protein: 8.4 g/dL (ref 6.0–8.5)

## 2016-10-08 LAB — VISCOSITY, SERUM: Viscosity, Serum: 1.9 rel.saline (ref 1.6–1.9)

## 2016-10-09 ENCOUNTER — Ambulatory Visit: Payer: No Typology Code available for payment source

## 2016-10-09 ENCOUNTER — Ambulatory Visit (HOSPITAL_BASED_OUTPATIENT_CLINIC_OR_DEPARTMENT_OTHER): Payer: No Typology Code available for payment source | Admitting: Hematology and Oncology

## 2016-10-09 ENCOUNTER — Encounter: Payer: Self-pay | Admitting: Hematology and Oncology

## 2016-10-09 ENCOUNTER — Telehealth: Payer: Self-pay | Admitting: Hematology and Oncology

## 2016-10-09 ENCOUNTER — Telehealth: Payer: Self-pay

## 2016-10-09 VITALS — BP 162/99 | HR 82 | Temp 98.6°F | Resp 17 | Ht 72.0 in | Wt 145.0 lb

## 2016-10-09 DIAGNOSIS — F17200 Nicotine dependence, unspecified, uncomplicated: Secondary | ICD-10-CM

## 2016-10-09 DIAGNOSIS — R779 Abnormality of plasma protein, unspecified: Secondary | ICD-10-CM

## 2016-10-09 DIAGNOSIS — E8809 Other disorders of plasma-protein metabolism, not elsewhere classified: Secondary | ICD-10-CM

## 2016-10-09 DIAGNOSIS — F1020 Alcohol dependence, uncomplicated: Secondary | ICD-10-CM

## 2016-10-09 NOTE — Telephone Encounter (Signed)
Pt verbalized change in pharmacy during appt today. Based on information given, attempted to locate pharmacy, but was unable to verify that pt was using pharmacy as described. Called pt with no answer attempted to clarify.

## 2016-10-09 NOTE — Telephone Encounter (Signed)
Patient given avs report and sent back to lab. Per 7/24 los no f/u - patient may need referral to rheumatology or ID and will be contacted re results per los.

## 2016-10-09 NOTE — Progress Notes (Signed)
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 Review of his history is significant for alcohol abuse and reported polysubstance abuse, although patient is denying previous history of IV drug use. Patient does have extensive family history of malignancy with rest cancer in male relatives including sister and maternal aunt and his daughter who is currently undergoing therapy for PNH.  Hour or an evaluation continues to show elevated protein with polyclonal IgG and normal levels of IgA and IgM. A light chain level is slightly elevated, but At the lambda ratio remains normal. 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 Most likely, this represents presence of persistent infection, autoimmune condition, or may represent the presence of another malignancy although patient does not have any symptoms to suggest that at this time.  I will refocus my additional evaluation into the avenues autoimmunity and infectious disease with ab workup outlined below.  Plan: --Labs today --Return to my clinic when necessary only  --The stoma lab work drawn today, I will redirect patient either into the infectious disease or rheumatology as appropriate.   Voice recognition software was used and creation of this note. Despite my best effort at editing the text, some misspelling/errors may have occurred.   Orders Placed This Encounter  Procedures  . Hepatitis C antibody (reflex if positive)    Standing Status:   Future    Number of Occurrences:   1    Standing Expiration Date:   10/09/2017  . HIV antibody (with reflex)    Standing Status:    Future    Number of Occurrences:   1    Standing Expiration Date:   10/09/2017  . Hepatitis B surface antibody    Standing Status:   Future    Number of Occurrences:   1    Standing Expiration Date:   10/09/2017  . Hepatitis B surface antigen    Standing Status:   Future    Number of Occurrences:   1    Standing Expiration Date:   10/09/2017  . Hepatitis B core antibody, total    Standing Status:   Future    Number of Occurrences:   1    Standing Expiration Date:   10/09/2017  . Hepatitis B core antibody, IgM    Standing Status:   Future    Number of Occurrences:   1    Standing Expiration Date:   10/09/2017  . ANA, IFA (with reflex)    Standing Status:   Future    Number of Occurrences:   1    Standing Expiration Date:   10/09/2017  . Rheumatoid factor    Standing Status:   Future    Number of Occurrences:   1    Standing Expiration Date:   10/09/2017  . Mononucleosis screen    Standing Status:   Future    Number of Occurrences:   1    Standing Expiration Date:   10/09/2017    Cancer Staging No matching staging information was found for the patient.  All questions were answered.  . The patient knows to call the clinic with any problems, questions or concerns.  This note was electronically signed.    History of Presenting  Illness Robert Carney 51 y.o. presenting to the [place] for Evaluation after discovery of elevated protein levels on routine lab work. Please see below in the oncological/hematological history for details. At the present time, complaints are those regarding voice weakness that has been present since he underwent surgery on his cervical spine due to myelopathy and nerve root compression. He initially presented to the attention of the physicians with 2-3 months of paresthesias, numbness, and progressive weakness in bilateral lower extremities tilting and difficulties with ambulation and falling. Evaluation revealed cervical spondylosis and myelopathy and  patient underwent surgery in January 2018. It appears that the weakness in the lower extremities is slowly improving. Nevertheless, patient does complain of persistent and stable dysphonia. He denies vision changes, swallowing difficulties. His upper extremities do have some weakness especially on the left side, but dexterity is generally preserved. He denies chest pain, shortness of breath or cough. Denies any pain in the muscles or bones. Denies any nausea, vomiting, abdominal pain, loss of appetite, diarrhea, or constipation. No dysuria or hematuria.  Patient does have a chronic alcohol abuse history drinking at least a sixpack of beer per day. He does smoke using one or less packs per week. Denies history of IV drug use, denies previous exposure to hepatitis C or HIV. No previous history of blood transfusions. Of note, his daughter carries a diagnosis of paroxysmal nocturnal hemoglobinuria.  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 Hypertension    no longer taking meds    Surgical History: Past Surgical History:  Procedure Laterality Date  . ANTERIOR CERVICAL CORPECTOMY N/A 03/26/2016   Procedure: Cervical  Seven Anterior cervical corpectomy;  Surgeon: Kevan Ny Ditty, MD;  Location: [facility];  Service: Neurosurgery;  Laterality: N/A;  Cervical  Seven Anterior cervical corpectomy  . tooth pulled      Family History: Family History  Problem Relation Age of Onset  . Diabetes Mother   . Lung cancer  Mother   . Prostate cancer Father   . Breast cancer Sister   . Breast cancer Maternal Aunt     Social History: Social History   Social History  . Marital status: Single    Spouse name: N/A  . Number of children: N/A  . Years of education: N/A   Occupational History  . Not on file.   Social History Main Topics  . Smoking status: Current Some Day Smoker    Packs/day: 0.20    Types: Cigarettes  . Smokeless tobacco: Never Used  . Alcohol use 25.2 oz/week    42 Cans of beer per week     Comment: Daily beer drinker  . Drug use: Yes    Types: Marijuana     Comment: No history of IV drug use.  Marland Kitchen Sexual activity: Not on file   Other Topics Concern  . Not on file   Social History Narrative  . No narrative on file    Allergies: Allergies  Allergen Reactions  . No Known Allergies     Medications:  Current Outpatient Prescriptions  Medication Sig Dispense Refill  . amLODipine (NORVASC) 5 MG tablet Take 1 tablet (5 mg total) by mouth daily. 30 tablet 2  . gabapentin (NEURONTIN) 300 MG capsule Take 1 capsule (300 mg total) by mouth 3 (three) times daily. 90 capsule 2  . hydrochlorothiazide (HYDRODIURIL) 25 MG tablet Take 1 tablet (25 mg total) by mouth daily. 30 tablet 2   No current facility-administered medications for this visit.     Review of Systems: Review of Systems  Constitutional: Positive for fatigue. Negative for appetite change, chills, diaphoresis and fever.       Patient does indicate occasional night sweats where he does need to change his clothes and pillowcase.  HENT:   Positive for voice change. Negative for hearing loss, lump/mass, mouth sores, nosebleeds, sore throat and trouble swallowing.        Patient reports the voice change that has started following the surgery conducted earlier this year for C-spine problems  Eyes: Negative for eye problems and icterus.  Respiratory: Negative for chest tightness, cough, hemoptysis, shortness of breath and  wheezing.   Cardiovascular: Negative for chest pain, leg swelling and palpitations.  Gastrointestinal: Negative for abdominal distention, abdominal pain, constipation, diarrhea, nausea and vomiting.  Endocrine: Negative for hot flashes.  Genitourinary: Negative for difficulty urinating, dysuria and hematuria.   Musculoskeletal: Positive for gait problem. Negative for arthralgias, back pain, myalgias, neck pain and neck stiffness.  Skin: Negative for itching and rash.  Neurological: Positive for extremity weakness, gait problem and numbness. Negative for dizziness, headaches, light-headedness, seizures and speech difficulty.       Gait problems preceded his surgical intervention. They appear to be improving slowly since the surgery. Come previously patient had significant difficulties maintaining balance and strength in the lower extremities.  Presently, he continues to experience numbness and paresthesias in bilateral lower extremities to the level of the knees.  Hematological: Negative for adenopathy. Does not bruise/bleed easily.     PHYSICAL EXAMINATION Blood pressure (!) 162/99, pulse 82, temperature 98.6 F (37 C), temperature source Oral, resp. rate 17, height 6' (1.829 m), weight 145 lb (65.8 kg), SpO2 100 %.  ECOG PERFORMANCE STATUS: 1 - Symptomatic but completely ambulatory  Physical Exam  Constitutional: He is oriented to person, place, and time. No distress.  Thin African-American male in no apparent distress.  HENT:  Head: Atraumatic.  Mouth/Throat: Oropharynx is clear and moist. No oropharyngeal exudate.  Patient does have weakness in his voice/dysphonia without significant dysarthria.  Eyes: Pupils are equal, round, and reactive to light. Conjunctivae and EOM are normal. No scleral icterus.  Neck: No thyromegaly present.  Cardiovascular: Normal rate, regular rhythm, normal heart sounds and intact distal pulses.  Exam reveals no gallop and no friction rub.   No murmur  heard. Pulmonary/Chest: Effort normal and breath sounds normal. No stridor. No respiratory distress. He has no wheezes. He has no rales.  Abdominal: Soft. Bowel sounds are normal. He exhibits no distension and no mass. There is no tenderness. There is no guarding.  Musculoskeletal: He exhibits no edema.  Lymphadenopathy:    He has no cervical adenopathy.  Neurological: He is alert and oriented to person, place, and time.  Right upper extremity strength 5/5, left upper extremity 4+/5; strength in the lower extremities appears to be symmetric, with mild weakness to dorsiflexion as well as knee flexion, but preserved strength of the hip flexion and knee extension. Deep tendon reflexes normal in the upper and lower extremities,  no clonus present.  Skin: Skin is warm and dry. No rash noted. He is not diaphoretic. No pallor.     LABORATORY DATA: I have personally reviewed the data as listed: No visits with results within 1 Week(s) from this visit.  Latest known visit with results is:  Appointment on 10/02/2016  Component Date Value Ref Range Status  . WBC 10/02/2016 5.7  4.0 - 10.3 10e3/uL Final  . NEUT# 10/02/2016 3.4  1.5 - 6.5 10e3/uL Final  . HGB 10/02/2016 13.1  13.0 - 17.1 g/dL Final  . HCT 10/02/2016 39.5  38.4 - 49.9 % Final  . Platelets 10/02/2016 271  140 - 400 10e3/uL Final  . MCV 10/02/2016 88.0  79.3 - 98.0 fL Final  . MCH 10/02/2016 29.2  27.2 - 33.4 pg Final  . MCHC 10/02/2016 33.2  32.0 - 36.0 g/dL Final  . RBC 10/02/2016 4.49  4.20 - 5.82 10e6/uL Final  . RDW 10/02/2016 13.8  11.0 - 14.6 % Final  . lymph# 10/02/2016 1.8  0.9 - 3.3 10e3/uL Final  . MONO# 10/02/2016 0.4  0.1 - 0.9 10e3/uL Final  . Eosinophils Absolute 10/02/2016 0.1  0.0 - 0.5 10e3/uL Final  . Basophils Absolute 10/02/2016 0.0  0.0 - 0.1 10e3/uL Final  . NEUT% 10/02/2016 59.9  39.0 - 75.0 % Final  . LYMPH% 10/02/2016 30.8  14.0 - 49.0 % Final  . MONO% 10/02/2016 7.4  0.0 - 14.0 % Final  . EOS% 10/02/2016  1.4  0.0 - 7.0 % Final  . BASO% 10/02/2016 0.5  0.0 - 2.0 % Final  . nRBC 10/02/2016 0  0 - 0 % Final  . Retic % 10/02/2016 0.92  0.80 - 1.80 % Final  . Retic Ct Abs 10/02/2016 41.31  34.80 - 93.90 10e3/uL Final  . Immature Retic Fract 10/02/2016 4.30  3.00 - 10.60 % Final  . Tear Drop Cells 10/02/2016 Few  Negative Final  . Ovalocytes 10/02/2016 Few  Negative Final  . Target Cells 10/02/2016 Few  Negative Final  . White Cell Comments 10/02/2016 C/W auto diff   Final  . PLT EST 10/02/2016 Adequate  Adequate Final  . LDH 10/02/2016 159  125 - 245 U/L Final  . Sodium 10/02/2016 140  136 - 145 mEq/L Final  . Potassium 10/02/2016 3.7  3.5 - 5.1 mEq/L Final  . Chloride 10/02/2016 99  98 - 109 mEq/L Final  . CO2 10/02/2016 28  22 - 29 mEq/L Final  . Glucose 10/02/2016 85  70 - 140 mg/dl Final   Glucose reference range is for nonfasting patients. Fasting glucose reference range is 70- 100.  Marland Kitchen BUN 10/02/2016 12.2  7.0 - 26.0 mg/dL Final  . Creatinine 10/02/2016 1.2  0.7 - 1.3 mg/dL Final  . Total Bilirubin 10/02/2016 0.40  0.20 - 1.20 mg/dL Final  . Alkaline Phosphatase 10/02/2016 52  40 - 150 U/L Final  . AST 10/02/2016 23  5 - 34 U/L Final  . ALT 10/02/2016 13  0 - 55 U/L Final  . Total Protein 10/02/2016 9.0* 6.4 - 8.3 g/dL Final  . Albumin 10/02/2016 4.6  3.5 - 5.0 g/dL Final  . Calcium 10/02/2016 10.2  8.4 - 10.4 mg/dL Final  . Anion Gap 10/02/2016 14* 3 - 11 mEq/L Final  . EGFR 10/02/2016 84* >90 ml/min/1.73 m2 Final   eGFR is calculated using the CKD-EPI Creatinine Equation (2009)  . IgG, Qn, Serum 10/02/2016 1,683* 700 - 1600 mg/dL Final  . IgA, Qn,  Serum 10/02/2016 267  90 - 386 mg/dL Final  . IgM, Qn, Serum 10/02/2016 66  20 - 172 mg/dL Final  . Total Protein 10/02/2016 8.4  6.0 - 8.5 g/dL Final  . Albumin SerPl Elph-Mcnc 10/02/2016 4.3  2.9 - 4.4 g/dL Final  . Alpha 1 10/02/2016 0.2  0.0 - 0.4 g/dL Final  . Alpha2 Glob SerPl Elph-Mcnc 10/02/2016 0.9  0.4 - 1.0 g/dL Final  .  B-Globulin SerPl Elph-Mcnc 10/02/2016 1.3  0.7 - 1.3 g/dL Final  . Gamma Glob SerPl Elph-Mcnc 10/02/2016 1.7  0.4 - 1.8 g/dL Final  . M Protein SerPl Elph-Mcnc 10/02/2016 Not Observed  Not Observed g/dL Final  . Globulin, Total 10/02/2016 4.1* 2.2 - 3.9 g/dL Final  . Albumin/Glob SerPl 10/02/2016 1.1  0.7 - 1.7 Final  . IFE 1 10/02/2016 Comment   Final   Comment: An apparent polyclonal gammopathy: IgG. Kappa and lambda typing appear increased.   . Please Note 10/02/2016 Comment   Final   Comment: Protein electrophoresis scan will follow via computer, mail, or courier delivery.   . Viscosity, Serum 10/02/2016 1.9  1.6 - 1.9 rel.saline Final   Comment: Values above 2.7 may indicate paraproteinemia is present. This test was developed and its performance characteristics determined by [facility]. It has not been cleared or approved by the Food and Drug Administration.   Loletha Grayer Kappa Free Light Chain 10/02/2016 24.9* 3.3 - 19.4 mg/L Final  . Ig Lambda Free Light Chain 10/02/2016 18.2  5.7 - 26.3 mg/L Final  . Kappa/Lambda FluidC Ratio 10/02/2016 1.37  0.26 - 1.65 Final  . Beta-2 10/02/2016 1.9  0.6 - 2.4 mg/L Final   Siemens Immulite 2000 Immunochemiluminometric assay (ICMA)       Ardath Sax, MD

## 2016-10-09 NOTE — Assessment & Plan Note (Signed)
51 year old male with history of peripheral neuropathy in the context of cervical spondylosis and myelopathy who is referred to our clinic for elevated protein levels in absence of evidence of monoclonal protein on the serum protein electrophoresis. Review of his history is significant for alcohol abuse and reported polysubstance abuse, although patient is denying previous history of IV drug use. Patient does have extensive family history of malignancy with rest cancer in male relatives including sister and maternal aunt and his daughter who is currently undergoing therapy for PNH.  Hour or an evaluation continues to show elevated protein with polyclonal IgG and normal levels of IgA and IgM. A light chain level is slightly elevated, but At the lambda ratio remains normal. 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 Most likely, this represents presence of persistent infection, autoimmune condition, or may represent the presence of another malignancy although patient does not have any symptoms to suggest that at this time.  I will refocus my additional evaluation into the avenues autoimmunity and infectious disease with ab workup outlined below.  Plan: --Labs today --Return to my clinic when necessary only  --The stoma lab work drawn today, I will redirect patient either into the infectious disease or rheumatology as appropriate.   Voice recognition software was used and creation of this note. Despite my best effort at editing the text, some misspelling/errors may have occurred.

## 2016-10-10 ENCOUNTER — Telehealth: Payer: Self-pay

## 2016-10-10 LAB — HIV ANTIBODY (ROUTINE TESTING W REFLEX): HIV Screen 4th Generation wRfx: NONREACTIVE

## 2016-10-10 LAB — HEPATITIS B SURFACE ANTIBODY,QUALITATIVE: Hep B Surface Ab, Qual: NONREACTIVE

## 2016-10-10 LAB — HEPATITIS C ANTIBODY (REFLEX): HCV Ab: 0.1 s/co ratio (ref 0.0–0.9)

## 2016-10-10 LAB — HEPATITIS B CORE ANTIBODY, IGM: Hep B Core Ab, IgM: NEGATIVE

## 2016-10-10 LAB — HEPATITIS B SURFACE ANTIGEN: HBsAg Screen: NEGATIVE

## 2016-10-10 LAB — HEPATITIS B CORE ANTIBODY, TOTAL: Hep B Core Ab, Tot: NEGATIVE

## 2016-10-10 LAB — MONONUCLEOSIS SCREEN: Mono Screen: NEGATIVE

## 2016-10-10 LAB — RHEUMATOID FACTOR: RA Latex Turbid.: <10 IU/mL (ref 0.0–13.9)

## 2016-10-10 NOTE — Telephone Encounter (Signed)
Confirmed new pharmacy with pt.

## 2016-10-11 LAB — ANTINUCLEAR ANTIBODIES, IFA: ANA Titer 1: NEGATIVE

## 2016-10-12 ENCOUNTER — Ambulatory Visit (HOSPITAL_COMMUNITY)
Admission: RE | Admit: 2016-10-12 | Discharge: 2016-10-12 | Disposition: A | Discharge status: Home / Self Care | Payer: No Typology Code available for payment source | Source: Ambulatory Visit | Attending: Hematology and Oncology | Admitting: Hematology and Oncology

## 2016-10-12 DIAGNOSIS — R779 Abnormality of plasma protein, unspecified: Secondary | ICD-10-CM

## 2016-10-12 DIAGNOSIS — E8809 Other disorders of plasma-protein metabolism, not elsewhere classified: Secondary | ICD-10-CM | POA: Insufficient documentation

## 2016-10-15 LAB — UIFE/LIGHT CHAINS/TP QN, 24-HR UR
FR KAPPA LT CH,24HR: 6 mg/24 hr
FR LAMBDA LT CH,24HR: 0 mg/24 hr
Free Kappa Lt Chains,Ur: 2.11 mg/L (ref 1.35–24.19)
Free Lambda Lt Chains,Ur: 0.13 mg/L — ABNORMAL LOW (ref 0.24–6.66)
Kappa/Lambda Ratio,U: 16.23 — ABNORMAL HIGH (ref 2.04–10.37)
PROTEIN,TOTAL,URINE: <4 mg/dL
Prot,24hr calculated: <120 mg/24 hr (ref 30–150)

## 2016-10-18 ENCOUNTER — Telehealth: Payer: Self-pay

## 2016-10-18 NOTE — Telephone Encounter (Signed)
Explained to pt that findings were not consistent with myeloma. His care will be returned to his PCP and potential workup for infectious process. Although, at this time, not infectious is noted.   Left message with Dr. Hussain's nurse JPaulita Fujitaoni ReiningNicole regarding findings and recommendation for how to proceed. No reason for us to f/u with pt at this time.

## 2016-10-31 ENCOUNTER — Encounter: Payer: Self-pay | Admitting: *Deleted

## 2016-11-01 ENCOUNTER — Ambulatory Visit (INDEPENDENT_AMBULATORY_CARE_PROVIDER_SITE_OTHER): Payer: Self-pay | Admitting: Neurology

## 2016-11-01 ENCOUNTER — Encounter: Payer: Self-pay | Admitting: Neurology

## 2016-11-01 VITALS — BP 137/89 | HR 61 | Ht 72.0 in | Wt 146.5 lb

## 2016-11-01 DIAGNOSIS — R269 Unspecified abnormalities of gait and mobility: Secondary | ICD-10-CM

## 2016-11-01 DIAGNOSIS — E538 Deficiency of other specified B group vitamins: Secondary | ICD-10-CM

## 2016-11-01 DIAGNOSIS — G959 Disease of spinal cord, unspecified: Secondary | ICD-10-CM

## 2016-11-01 HISTORY — DX: Disease of spinal cord, unspecified: G95.9

## 2016-11-01 HISTORY — DX: Unspecified abnormalities of gait and mobility: R26.9

## 2016-11-01 NOTE — Progress Notes (Addendum)
Reason for visit: Paresthesias  Referring physician: Dr. Harless LittenHusain  Robert Carney is a 51 y.o. male  History of present illness:  Robert Carney is a 51 year old right-handed white male with a history of onset of paresthesias that began on all 4 extremities one year ago. The patient had progression of sensory symptoms that went up the legs to the hip area and in the hands up to the elbow area bilaterally. The patient claims that the sensory alterations are about the same from one side to the next. The patient started becoming weak in the legs and in the hands, right greater than left. The patient has had some gait instability, he has not fallen but he will stagger on occasion. He denies any changes in the way the bowels or the bladder are working. The patient has undergone MRI evaluation of the cervical and thoracic spine which revealed evidence of spinal cord compression at the C7-T1 level. The patient underwent decompressive surgery in January 2018. Since the surgery, the patient has not had any progression, there has been some improvement in the numbness and paresthesias of the arms, but the lower extremity sensory changes are still present. The patient has not had any progression of his gait instability since surgery but the problems are still there. The patient denies any overt pain or discomfort. He has undergone blood work to include a vitamin B12 level which is in the low normal range. He is sent to this office for further evaluation.  Past Medical History:  Diagnosis Date  . Hypertension    no longer taking meds    Past Surgical History:  Procedure Laterality Date  . ANTERIOR CERVICAL CORPECTOMY N/A 03/26/2016   Procedure: Cervical  Seven Anterior cervical corpectomy;  Surgeon: Loura HaltBenjamin Jared Ditty, MD;  [facility];  Service: Neurosurgery;  Laterality: N/A;  Cervical  Seven Anterior cervical corpectomy  . tooth pulled      Family History  Problem Relation Age of Onset  .  Diabetes Mother   . Lung cancer Mother   . Hypertension Mother   . Prostate cancer Father   . Hypertension Father   . Breast cancer Sister   . Breast cancer Maternal Aunt     Social history:  reports that he has been smoking Cigarettes.  He has been smoking about 0.20 packs per day. He has never used smokeless tobacco. He reports that he drinks about 25.2 oz of alcohol per week . He reports that he uses drugs, including Marijuana.  Medications:  Prior to Admission medications   Medication Sig Start Date End Date Taking? Authorizing Provider  amLODipine (NORVASC) 5 MG tablet Take 1 tablet (5 mg total) by mouth daily. 03/27/16  Yes Ditty, Loura HaltBenjamin Jared, MD  gabapentin (NEURONTIN) 300 MG capsule Take 1 capsule (300 mg total) by mouth 3 (three) times daily. 03/27/16  Yes Ditty, Loura HaltBenjamin Jared, MD  hydrochlorothiazide (HYDRODIURIL) 25 MG tablet Take 1 tablet (25 mg total) by mouth daily. 03/28/16  Yes Ditty, Loura HaltBenjamin Jared, MD      Allergies  Allergen Reactions  . No Known Allergies     ROS:  Out of a complete 14 system review of symptoms, the patient complains only of the following symptoms, and all other reviewed systems are negative.  Weight loss Hearing loss  Blood pressure 137/89, pulse 61, height 6' (1.829 m), weight 146 lb 8 oz (66.5 kg).  Physical Exam  General: The patient is alert and cooperative at the time of the  examination.  Eyes: Pupils are equal, round, and reactive to light. Discs are flat bilaterally.  Neck: The neck is supple, no carotid bruits are noted.  Respiratory: The respiratory examination is clear.  Cardiovascular: The cardiovascular examination reveals a regular rate and rhythm, a grade I/VI systolic ejection murmur at the aortic area is noted.  Skin: Extremities are without significant edema.  Neurologic Exam  Mental status: The patient is alert and oriented x 3 at the time of the examination. The patient has apparent normal recent and remote  memory, with an apparently normal attention span and concentration ability.  Cranial nerves: Facial symmetry is present. There is good sensation of the face to pinprick and soft touch bilaterally. The strength of the facial muscles and the muscles to head turning and shoulder shrug are normal bilaterally. Speech is dysphonic, not aphasic. Extraocular movements are full. Visual fields are full. The tongue is midline, and the patient has symmetric elevation of the soft palate. No obvious hearing deficits are noted.  Motor: The motor testing reveals 5 over 5 strength of all 4 extremities, with exception of slight weakness with hip flexion bilaterally and with intrinsic muscle strength weakness of the hands bilaterally. Good symmetric motor tone is noted throughout.  Sensory: Sensory testing is intact to pinprick, soft touch, vibration sensation, and position sense on all 4 extremities, with exception of some decrease in vibration sensation and position sensation in the left foot. No evidence of extinction is noted.  Coordination: Cerebellar testing reveals good finger-nose-finger and heel-to-shin bilaterally.  Gait and station: Gait is normal. Tandem gait is unsteady. Romberg is negative. No drift is seen.  Reflexes: Deep tendon reflexes are symmetric, somewhat brisk at the biceps and knee jerk reflexes bilaterally, ankle jerk reflexes are maintained bilaterally. Toes are equivocal bilaterally.   MRI cervical and thoracic 03/21/16:  IMPRESSION: MRI CERVICAL SPINE: Expansile cord edema C7-T1 with punctate enhancement/inflammation suggesting pre syrinx from spinal canal stenosis, atypical for demyelination. Mild myelomalacia C6-7.  Grade 1 C6-7 retrolisthesis and grade 1 C7-T1 anterolisthesis on degenerative basis, multilevel facet arthropathy and reactive changes.  Severe canal stenosis C7-T1, moderate to severe at C6-7. Multilevel severe neural foraminal narrowing.  Slow flow versus  occlusion proximal RIGHT vertebral artery.  MRI THORACIC SPINE:  Negative.  * MRI scan images were reviewed online. I agree with the written report.    Assessment/Plan:  1. Cervical myelopathy  2. 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 The patient had evidence of increased cord signal and edema prior to surgery. 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 The patient does have a low normal vitamin B-12 level, he will have a methylmalonic acid level checked today and he will go on vitamin B12 supplementation, 1000 g daily. He will be set up for physical therapy for gait training. He will follow-up through this office on an as-needed basis. The sensory alteration in the legs is likely to be permanent.  Marlan Palau MD 11/01/2016 8:35 AM  [facility] [address] [place], [place] [address]  Phone [phone] Fax [phone]

## 2016-11-01 NOTE — Patient Instructions (Addendum)
   We will get physical therapy set up.  Start taking Vitamin B12 1000 mcg a day.

## 2016-11-04 LAB — METHYLMALONIC ACID, SERUM: Methylmalonic Acid: 162 nmol/L (ref 0–378)

## 2016-11-05 ENCOUNTER — Telehealth: Payer: Self-pay | Admitting: *Deleted

## 2016-11-05 NOTE — Telephone Encounter (Signed)
-----   Message from York Spaniel, MD sent at 11/05/2016  7:21 AM EDT -----  The blood work results are unremarkable. Please call the patient.  ----- Message ----- From: Nell [facility] Lab Results In Sent: 11/04/2016   4:36 PM To: York Spaniel, MD

## 2016-11-05 NOTE — Telephone Encounter (Signed)
Called and LVM for pt about unremarkable labs per CW,MD note. Gave [facility] phone number if he has further questions or concerns.

## 2016-12-10 ENCOUNTER — Ambulatory Visit: Payer: No Typology Code available for payment source | Admitting: Physical Therapy

## 2016-12-14 ENCOUNTER — Ambulatory Visit: Payer: No Typology Code available for payment source | Admitting: Physical Therapy

## 2016-12-20 ENCOUNTER — Ambulatory Visit: Payer: No Typology Code available for payment source | Attending: Neurology

## 2016-12-20 DIAGNOSIS — R2689 Other abnormalities of gait and mobility: Secondary | ICD-10-CM | POA: Insufficient documentation

## 2016-12-20 DIAGNOSIS — R208 Other disturbances of skin sensation: Secondary | ICD-10-CM | POA: Insufficient documentation

## 2016-12-20 DIAGNOSIS — R2681 Unsteadiness on feet: Secondary | ICD-10-CM | POA: Insufficient documentation

## 2016-12-20 DIAGNOSIS — M6281 Muscle weakness (generalized): Secondary | ICD-10-CM | POA: Insufficient documentation

## 2016-12-20 NOTE — Therapy (Signed)
[facility] [address] [place], [place], [address] Phone: [phone]   Fax:  [phone]  Physical Therapy Evaluation  Patient Details  Name: Robert Carney MRN: [id number] Date of Birth: February 21, 1966 Referring Provider: Dr. Anne Hahn  Encounter Date: 12/20/2016      PT End of Session - 12/20/16 1040    Visit Number 1   Number of Visits 9   Date for PT Re-Evaluation 01/19/17   Authorization Type Self pay: [facility] discount, pt applying for disability   Equipment Utilized During Treatment --  min guard to S prn   Activity Tolerance Patient tolerated treatment well   Behavior During Therapy [facility] for tasks assessed/performed      Past Medical History:  Diagnosis Date  . Cervical myelopathy (HCC) 11/01/2016  . Gait abnormality 11/01/2016  . Hypertension    no longer taking meds    Past Surgical History:  Procedure Laterality Date  . ANTERIOR CERVICAL CORPECTOMY N/A 03/26/2016   Procedure: Cervical  Seven Anterior cervical corpectomy;  Surgeon: [name], MD;  Location: [facility] OR;  Service: Neurosurgery;  Laterality: N/A;  Cervical  Seven Anterior cervical corpectomy  . tooth pulled      There were no vitals filed for this visit.       Subjective Assessment - 12/20/16 0934    Subjective Pt reported he had surgery in 03/2016 for cervical myelopathy. Pt reported approx. 2 falls in the last 6 months. Pt states he has intermittent N/T (mostly constant in the morning) in BLEs and weakness, and hasn't noticed much improvement since surgery. Pt states balance is worse in shower when he closes his eyes. He has difficulty with inclines and steps.    Pertinent History HTN, hx of cervical of myelopathy s/p surgery in 03/2016, ETOH abuse,    Patient Stated Goals Get my walking back straight and get back on balance.    Currently in Pain? No/denies            [facility] PT Assessment - 12/20/16 6962      Assessment   Medical Diagnosis Gait abnormality and cervical myelopathy    Referring Provider Dr. Anne Hahn   Onset Date/Surgical Date 03/26/16   Hand Dominance Right   Prior Therapy None     Precautions   Precautions Fall     Restrictions   Weight Bearing Restrictions No     Balance Screen   Has the patient fallen in the past 6 months Yes   How many times? 2   Has the patient had a decrease in activity level because of a fear of falling?  Yes   Is the patient reluctant to leave their home because of a fear of falling?  No     Home Nurse, mental health Private residence   Living Arrangements Parent   Available Help at Discharge Family   Type of Home House   Home Access Stairs to enter   Entrance Stairs-Number of Steps 1  threshold   Entrance Stairs-Rails None   Home Layout One level   Home Equipment [employer] - single point;Bedside commode  uses SPC every "now and again"     Prior Function   Level of Independence Independent   Vocation Other (comment)  pt not able working and has applied for disability   Leisure [place] out with friends     Cognition   Overall Cognitive Status Within Functional Limits for tasks assessed     Observation/Other Assessments  Focus on Therapeutic Outcomes (FOTO)  ABC: 26.3% with scores closer to 100% indicating complete confidence in balance.      Sensation   Light Touch Impaired by gross assessment   Additional Comments Pt reported decr. light touch in LLE     Coordination   Gross Motor Movements are Fluid and Coordinated Yes   Fine Motor Movements are Fluid and Coordinated No   Heel Shin Test Decr. speed and coordination with LLE     Tone   Assessment Location Other (comment)  pt denied spasms     ROM / Strength   AROM / PROM / Strength AROM;Strength     AROM   Overall AROM  Within functional limits for tasks performed   Overall AROM Comments BUE/LE AROM WFL     Strength   Overall Strength Deficits   Overall Strength Comments BUE  WFL. RLE grossly: 4/5. LLE: hip flex: 3+/5, knee flex: 4/5, knee ext: 4-/5, ankle DF: 4/5. Hip abd/ add tested in seated position: 4/5.     Transfers   Transfers Sit to Stand;Stand to Sit   Sit to Stand 5: Supervision;Without upper extremity assist;From chair/3-in-1   Stand to Sit 5: Supervision;Without upper extremity assist;To chair/3-in-1     Ambulation/Gait   Ambulation/Gait Yes   Ambulation/Gait Assistance 5: Supervision   Ambulation/Gait Assistance Details Pt with intermittent steppage gait with LLE to clear L foot. FHP noted during gait.    Ambulation Distance (Feet) 300 Feet   Assistive device None   Gait Pattern Step-through pattern;Decreased arm swing - right;Decreased arm swing - left;Decreased stride length;Decreased dorsiflexion - left;Left steppage  decr. arm swing during dynamic gait activities   Ambulation Surface Level;Indoor   Gait velocity 3.47ft/sec.  no AD     6 Minute Walk- Baseline   6 Minute Walk- Baseline yes   BP (mmHg) (!)  142/91   HR (bpm) 58   02 Sat (%RA) 99 %   Modified Borg Scale for Dyspnea 0- Nothing at all   Perceived Rate of Exertion (Borg) 7- Very, very light     6 Minute walk- Post Test   6 Minute Walk Post Test yes   BP (mmHg) 144/86   HR (bpm) 57   02 Sat (%RA) 96 %   Modified Borg Scale for Dyspnea 3- Moderate shortness of breath or breathing difficulty   Perceived Rate of Exertion (Borg) 13- Somewhat hard     6 minute walk test results    Aerobic Endurance Distance Walked 1022   Endurance additional comments No LOB but pt reported LLE fatigue.     Functional Gait  Assessment   Gait assessed  Yes   Gait Level Surface Walks 20 ft in less than 7 sec but greater than 5.5 sec, uses assistive device, slower speed, mild gait deviations, or deviates 6-10 in outside of the 12 in walkway width.  6.6 sec.   Change in Gait Speed Makes only minor adjustments to walking speed, or accomplishes a change in speed with significant gait deviations,  deviates 10-15 in outside the 12 in walkway width, or changes speed but loses balance but is able to recover and continue walking.   Gait with Horizontal Head Turns Performs head turns smoothly with slight change in gait velocity (eg, minor disruption to smooth gait path), deviates 6-10 in outside 12 in walkway width, or uses an assistive device.   Gait with Vertical Head Turns Performs task with slight change in gait velocity (eg, minor disruption to  smooth gait path), deviates 6 - 10 in outside 12 in walkway width or uses assistive device   Gait and Pivot Turn Pivot turns safely in greater than 3 sec and stops with no loss of balance, or pivot turns safely within 3 sec and stops with mild imbalance, requires small steps to catch balance.   Step Over Obstacle Is able to step over one shoe box (4.5 in total height) without changing gait speed. No evidence of imbalance.   Gait with Narrow Base of Support Ambulates less than 4 steps heel to toe or cannot perform without assistance.   Gait with Eyes Closed Cannot walk 20 ft without assistance, severe gait deviations or imbalance, deviates greater than 15 in outside 12 in walkway width or will not attempt task.   Ambulating Backwards Walks 20 ft, uses assistive device, slower speed, mild gait deviations, deviates 6-10 in outside 12 in walkway width.   Steps Alternating feet, must use rail.  rail to descend, no rail to ascend   Total Score 15   FGA comment: 15/30: indicates pt is at high risk for falls.             Objective measurements completed on examination: See above findings.                  PT Education - 12/20/16 1037    Education provided Yes   Education Details PT discussed exam findings, outcome measure results, PT POC, frequency, and duration.   Person(s) Educated Patient   Methods Explanation   Comprehension Verbalized understanding          PT Short Term Goals - 12/20/16 1047      PT SHORT TERM GOAL #1    Title same as LTGs.           PT Long Term Goals - 12/20/16 1047      PT LONG TERM GOAL #1   Title Pt will be IND in HEP to improve strength, endurance, and balance. TARGET DATE FOR ALL LTGS: 01/17/17   Baseline No HEP   Status New     PT LONG TERM GOAL #2   Title Pt will improve distance to >/=1222' to improve endurance.    Baseline 1022'   Status New     PT LONG TERM GOAL #3   Title Pt will improve FGA score to >/=23/30 to decr. falls risk.    Baseline 15/30   Status New     PT LONG TERM GOAL #4   Title Pt will be able to traverse 4 steps and ramp incline/decline at MOD I level to improve functional mobility.    Baseline 4 steps and incline/decline of ramp with S.   Status New     PT LONG TERM GOAL #5   Title Pt will amb. 500' over even/uneven terrain IND to improve functional mobility.    Baseline 300' and 1022' with S over even terrain   Status New     Additional Long Term Goals   Additional Long Term Goals Yes     PT LONG TERM GOAL #6   Title Pt will improve ABC score to >/=46% to improve confidence in balance.    Baseline 26.3%   Status New                Plan - 12/20/16 1040    Clinical Impression Statement Pt is a pleasant 51y/o male presenting to OPPT neuro for gait abnormality s/p surgery in 03/2016 for  cervical myelopathy. Pt's PMH is significant for the following: HTN, hx of cervical of myelopathy s/p surgery in 03/2016, ETOH abuse. The following deficits were noted during exam: gait deviations, impaired balance, decr. strength, decr. endurance, impaired posture, decr. coordination, and impaired sensation. Pt's gait speed was [facility]. However, pt's FGA score indicates pt is at a high risk for falls. Pt's ABC score indicates pt has decr. confidence in balance. Pt would benefit from skilled PT to improve safety during functional mobility.    History and Personal Factors relevant to plan of care: pt lives with his mother, pt is not working at this time.    Clinical Presentation Stable   Clinical Presentation due to: HTN, hx of cervical of myelopathy s/p surgery in 03/2016, ETOH abuse,    Clinical Decision Making Moderate   Rehab Potential Good   Clinical Impairments Affecting Rehab Potential see above   PT Frequency 2x / week   PT Duration 4 weeks   PT Treatment/Interventions ADLs/Self Care Home Management;Biofeedback;Neuromuscular re-education;Balance training;Therapeutic exercise;Therapeutic activities;Manual techniques;Functional mobility training;Stair training;Gait training;DME Instruction;Patient/family education;Orthotic Fit/Training;Vestibular   PT Next Visit Plan Provide pt with walking program, initiate strength and balance HEP   Consulted and Agree with Plan of Care Patient      Patient will benefit from skilled therapeutic intervention in order to improve the following deficits and impairments:  Abnormal gait, Decreased endurance, Impaired sensation, Decreased knowledge of use of DME, Decreased strength, Decreased mobility, Decreased balance, Postural dysfunction, Impaired flexibility, Decreased coordination  Visit Diagnosis: Other abnormalities of gait and mobility - Plan: PT plan of care cert/re-cert  Muscle weakness (generalized) - Plan: PT plan of care cert/re-cert  Unsteadiness on feet - Plan: PT plan of care cert/re-cert  Other disturbances of skin sensation - Plan: PT plan of care cert/re-cert     Problem List Patient Active Problem List   Diagnosis Date Noted  . Cervical myelopathy (HCC) 11/01/2016  . Gait abnormality 11/01/2016  . Elevated blood protein 10/02/2016  . Cervical spondylosis with myelopathy 03/26/2016  . Hyperglycemia 03/26/2016  . Polysubstance abuse (HCC) 03/26/2016  . ETOH abuse 03/26/2016  . Hypertensive urgency   . Lower extremity weakness 03/21/2016  . HYPERTENSION, BENIGN 01/30/2010  . 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 CHEST PAIN-UNSPECIFIED 01/30/2010  . ABNORMAL  ELECTROCARDIOGRAM 01/30/2010    Dani Danis L 12/20/2016, 10:52 AM  [facility] [address] [place], [place], [address] Phone: [phone]   Fax:  [phone]  Name: KADAR CHANCE MRN: [id number] Date of Birth: 08-Mar-1966  Zerita Boers, PT,DPT 12/20/16 10:52 AM Phone: [phone] Fax: [phone]

## 2016-12-26 ENCOUNTER — Ambulatory Visit: Payer: No Typology Code available for payment source | Admitting: *Deleted

## 2017-01-08 ENCOUNTER — Ambulatory Visit: Payer: No Typology Code available for payment source

## 2017-01-10 ENCOUNTER — Ambulatory Visit: Payer: No Typology Code available for payment source

## 2017-01-15 ENCOUNTER — Ambulatory Visit: Payer: No Typology Code available for payment source | Admitting: Physical Therapy

## 2017-01-17 ENCOUNTER — Ambulatory Visit: Payer: No Typology Code available for payment source

## 2017-01-22 ENCOUNTER — Ambulatory Visit: Payer: No Typology Code available for payment source | Attending: Neurology | Admitting: Physical Therapy

## 2017-01-24 ENCOUNTER — Ambulatory Visit: Payer: No Typology Code available for payment source

## 2017-03-06 NOTE — Therapy (Signed)
[facility] [address] [place], [place], [address] Phone: [phone]   Fax:  [phone]  Patient Details  Name: Robert Carney MRN: [id number] Date of Birth: [birth year] Referring Provider:  No ref. provider found  Encounter Date: 03/06/2017  PHYSICAL THERAPY DISCHARGE SUMMARY  Visits from Start of Care: 1  Current functional level related to goals / functional outcomes: PT Short Term Goals - 12/20/16 1047      PT SHORT TERM GOAL #1   Title  same as LTGs.      PT Long Term Goals - 12/20/16 1047      PT LONG TERM GOAL #1   Title  Pt will be IND in HEP to improve strength, endurance, and balance. TARGET DATE FOR ALL LTGS: 01/17/17    Baseline  No HEP    Status  New      PT LONG TERM GOAL #2   Title  Pt will improve 6MWT distance to >/=1222' to improve endurance.     Baseline  1022'    Status  New      PT LONG TERM GOAL #3   Title  Pt will improve FGA score to >/=23/30 to decr. falls risk.     Baseline  15/30    Status  New      PT LONG TERM GOAL #4   Title  Pt will be able to traverse 4 steps and ramp incline/decline at MOD I level to improve functional mobility.     Baseline  4 steps and incline/decline of ramp with S.    Status  New      PT LONG TERM GOAL #5   Title  Pt will amb. 500' over even/uneven terrain IND to improve functional mobility.     Baseline  300' and 1022' with S over even terrain    Status  New      Additional Long Term Goals   Additional Long Term Goals  Yes      PT LONG TERM GOAL #6   Title  Pt will improve ABC score to >/=46% to improve confidence in balance.     Baseline  26.3%    Status  New         Remaining deficits: Unknown, as pt did not return since last visit.   Education / Equipment: PT frequency, duration, and POC.   Plan: Patient agrees to discharge.  Patient goals were not met. Patient is being discharged due to not returning since the last visit.  ?????        Micayla Brathwaite L 03/06/2017, 1:49 PM  [facility] [address] [place], [place], [address] Phone: [phone]   Fax:  [phone]    Geoffry Paradise, PT,DPT 03/06/17 1:52 PM Phone: [phone] Fax: [phone]

## 2017-04-16 DIAGNOSIS — Z0289 Encounter for other administrative examinations: Secondary | ICD-10-CM

## 2018-03-10 ENCOUNTER — Emergency Department (HOSPITAL_COMMUNITY): Payer: Self-pay

## 2018-03-10 ENCOUNTER — Other Ambulatory Visit: Payer: Self-pay

## 2018-03-10 ENCOUNTER — Emergency Department (HOSPITAL_COMMUNITY)
Admission: EM | Admit: 2018-03-10 | Discharge: 2018-03-10 | Disposition: A | Discharge status: Home / Self Care | Payer: Self-pay | Attending: Emergency Medicine | Admitting: Emergency Medicine

## 2018-03-10 ENCOUNTER — Encounter (HOSPITAL_COMMUNITY): Payer: Self-pay | Admitting: Emergency Medicine

## 2018-03-10 DIAGNOSIS — M25532 Pain in left wrist: Secondary | ICD-10-CM | POA: Insufficient documentation

## 2018-03-10 DIAGNOSIS — R600 Localized edema: Secondary | ICD-10-CM | POA: Insufficient documentation

## 2018-03-10 DIAGNOSIS — Y929 Unspecified place or not applicable: Secondary | ICD-10-CM | POA: Insufficient documentation

## 2018-03-10 DIAGNOSIS — Y939 Activity, unspecified: Secondary | ICD-10-CM | POA: Insufficient documentation

## 2018-03-10 DIAGNOSIS — Z79899 Other long term (current) drug therapy: Secondary | ICD-10-CM | POA: Insufficient documentation

## 2018-03-10 DIAGNOSIS — I10 Essential (primary) hypertension: Secondary | ICD-10-CM | POA: Insufficient documentation

## 2018-03-10 DIAGNOSIS — X58XXXA Exposure to other specified factors, initial encounter: Secondary | ICD-10-CM | POA: Insufficient documentation

## 2018-03-10 DIAGNOSIS — F17210 Nicotine dependence, cigarettes, uncomplicated: Secondary | ICD-10-CM | POA: Insufficient documentation

## 2018-03-10 DIAGNOSIS — Y999 Unspecified external cause status: Secondary | ICD-10-CM | POA: Insufficient documentation

## 2018-03-10 MED ORDER — HYDROCODONE-ACETAMINOPHEN 5-325 MG PO TABS
1.0000 | ORAL_TABLET | Freq: Once | ORAL | Status: AC
  Administered 2018-03-10: 1 via ORAL
  Filled 2018-03-10: qty 1

## 2018-03-10 MED ORDER — NAPROXEN 500 MG PO TABS: 500.0000 mg | ORAL_TABLET | Freq: Two times a day (BID) | ORAL | 0 refills | Status: DC

## 2018-03-10 NOTE — ED Triage Notes (Signed)
Pt. Stated, Ive had wrist pain for a week , no injury

## 2018-03-10 NOTE — Discharge Instructions (Addendum)
Your xrays were reassuring. Your xray showed a possible chronic fracture of the 1st digit however this is not over your area of pain. I do not think this is the cause of your pain.   Please see attached handout. Take NSAIDs (naproxen) with food as prescribed. Follow up with orthopedics or  pcp in the next 1 week.   Return for fever, redness of the joint, worsening swelling, pain with range of motion of the joint or new/wrosening concerning symptoms.

## 2018-03-10 NOTE — ED Provider Notes (Signed)
[facility]GENCY [facility] Provider Note   CSN: [id number] Arrival date & time: 03/10/18  [id number]     History   Chief Complaint Chief Complaint  Patient presents with  . Wrist Pain    HPI Robert Carney is a 52 y.o. male.  Medical history as below who presents emergency department today for left wrist pain and swelling.  Patient reports approximate 1 week ago he began having gradual onset of left wrist pain and swelling that he describes as diffuse.  The patient reports that he is tried elevation and ice for his symptoms that relieves his symptoms with a shortly after return.  He reports that his pain is on both sides of his wrist but does not extend into his hand.  He reports he has normal range of motion of the wrist but it is painful with maximal flexion and extension.  He denies any trauma, falls, injury to the wrist.  Patient denies history of diabetes.  He denies history of IV drug use in the past.  He does admit to some marijuana use.  Patient denies history of gout.  He denies skin changes, erythema or heat of the wrist.  No numbness, tingling or weakness.  He rates his pain as moderate to severe in nature.  He reports his symptoms are worse with palpation and maximal range of motion.  He has been trying over-the-counter pain medication for symptoms without any relief.  HPI  Past Medical History:  Diagnosis Date  . Cervical myelopathy (HCC) 11/01/2016  . Gait abnormality 11/01/2016  . Hypertension    no longer taking meds    Patient Active Problem List   Diagnosis Date Noted  . Cervical myelopathy (HCC) 11/01/2016  . Gait abnormality 11/01/2016  . Elevated blood protein 10/02/2016  . Cervical spondylosis with myelopathy 03/26/2016  . Hyperglycemia 03/26/2016  . Polysubstance abuse (HCC) 03/26/2016  . ETOH abuse 03/26/2016  . Hypertensive urgency   . Lower extremity weakness 03/21/2016  . HYPERTENSION, BENIGN 01/30/2010  . 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 CHEST PAIN-UNSPECIFIED 01/30/2010  . ABNORMAL ELECTROCARDIOGRAM 01/30/2010    Past Surgical History:  Procedure Laterality Date  . ANTERIOR CERVICAL CORPECTOMY N/A 03/26/2016   Procedure: Cervical  Seven Anterior cervical corpectomy;  Surgeon: Loura HaltBenjamin Jared Ditty, MD;  [facility];  Service: Neurosurgery;  Laterality: N/A;  Cervical  Seven Anterior cervical corpectomy  . tooth pulled          Home Medications    Prior to Admission medications   Medication Sig Start Date End Date Taking? Authorizing Provider  amLODipine (NORVASC) 5 MG tablet Take 1 tablet (5 mg total) by mouth daily. 03/27/16   Ditty, Loura HaltBenjamin Jared, MD  gabapentin (NEURONTIN) 300 MG capsule Take 1 capsule (300 mg total) by mouth 3 (three) times daily. 03/27/16   Ditty, Loura HaltBenjamin Jared, MD  hydrochlorothiazide (HYDRODIURIL) 25 MG tablet Take 1 tablet (25 mg total) by mouth daily. 03/28/16   Ditty, Loura HaltBenjamin Jared, MD  vitamin B-12 (CYANOCOBALAMIN) 100 MCG tablet Take 100 mcg by mouth daily.    [provider]    Family History Family History  Problem Relation Age of Onset  . Diabetes Mother   . Lung cancer Mother   . Hypertension Mother   . Prostate cancer Father   . Hypertension Father   . Breast cancer Sister   . Breast cancer Maternal Aunt     Social History Social History   Tobacco  Use  . Smoking status: Current Some Day Smoker    Packs/day: 0.20    Types: Cigarettes  . Smokeless tobacco: Never Used  Substance Use Topics  . Alcohol use: Yes    Alcohol/week: 42.0 standard drinks    Types: 42 Cans of beer per week    Comment: Daily beer drinker  . Drug use: Yes    Types: Marijuana    Comment: No history of IV drug use.     Allergies   No known allergies   Review of Systems Review of Systems  Constitutional: Negative for fever.  Respiratory: Negative for cough and shortness of breath.   Cardiovascular: Negative for chest pain.  Gastrointestinal:  Negative for nausea and vomiting.  Musculoskeletal: Positive for arthralgias and joint swelling.  Skin: Negative for color change, rash and wound.  Allergic/Immunologic: Negative for immunocompromised state.  Neurological: Negative for weakness and numbness.     Physical Exam Updated Vital Signs BP 125/87 (BP Location: Right Arm)   Pulse 88   Temp 98.5 F (36.9 C) (Oral)   Resp 16   Ht 6' (1.829 m)   Wt 68 kg   SpO2 100%   BMI 20.34 kg/m   Physical Exam Vitals signs and nursing note reviewed.  Constitutional:      Appearance: He is well-developed.  HENT:     Head: Normocephalic and atraumatic.     Right Ear: External ear normal.     Left Ear: External ear normal.  Eyes:     General: No scleral icterus.       Right eye: No discharge.        Left eye: No discharge.     Conjunctiva/sclera: Conjunctivae normal.  Pulmonary:     Effort: Pulmonary effort is normal. No respiratory distress.  Musculoskeletal:     Left wrist: He exhibits bony tenderness (diffuse) and swelling. He exhibits normal range of motion (able flexion, extension, ulnar & radial deviation. He notes pain with maximal rom), no crepitus, no deformity and no laceration.     Left forearm: Normal.     Left hand: He exhibits swelling. He exhibits normal range of motion, no tenderness and no bony tenderness. Normal sensation noted. Normal strength noted.     Comments: Left wrist and hand.  The left wrist is with swelling is noted in the picture below.  There is no breaks of the skin.  There is no erythema or heat noted.  There is tenderness palpation over the distal radius and ulna.  No deformities noted.  Patient has intact range of motion for flexion, extension, supination/pronation, radial and ulnar deviation.  He notes pain with maximal range of motion's of the wrist.  Patient also with swelling on the dorsal aspect of the left hand.  There is no breaks of the skin, erythema or heat.  No rash.  Patient without any  tenderness of the hand or digits.  He has full and active range of motion for flexion/extension of all digits.  Compartments are soft. Radial artery 2+ with <2sec cap refill. SILT in M/U/R distributions. Grip 5/5 strength.    Skin:    General: Skin is warm and dry.     Capillary Refill: Capillary refill takes less than 2 seconds.     Coloration: Skin is not pale.     Findings: No erythema.  Neurological:     Mental Status: He is alert.     Sensory: Sensation is intact.  ED Treatments / Results  Labs (all labs ordered are listed, but only abnormal results are displayed) Labs Reviewed - No data to display  EKG None  Radiology Dg Wrist Complete Left  Result Date: 03/10/2018 CLINICAL DATA:  Left wrist pain and swelling.  No injury EXAM: LEFT WRIST - COMPLETE 3+ VIEW COMPARISON:  None. FINDINGS: Mild degenerative change in the distal radioulnar joint. Radiocarpal joint normal. Intercarpal joints normal. Negative for erosion. Soft tissue swelling surrounding the wrist joint. Displacement of pronator fat pad. IMPRESSION: Diffuse soft tissue swelling around the wrist. Negative for fracture. Mild degenerative change distal radioulnar joint. Electronically Signed   By: Marlan Palau M.D.   On: 03/10/2018 10:57   Dg Hand Complete Left  Result Date: 03/10/2018 CLINICAL DATA:  Pain and swelling no injury EXAM: LEFT HAND - COMPLETE 3+ VIEW COMPARISON:  None. FINDINGS: Negative for acute fracture College loss and spurring and subluxation of the third MCP joint. Question chronic injury and ligament damage Irregularity of the first proximal phalanx which may be due to chronic healed fracture. Mild degenerative change in the first MCP. Negative for periarticular erosion. IMPRESSION: Negative for acute fracture Degenerative changes third MCP joint, question prior injury. Degenerative change at the first MCP joint. Question chronic fracture of the first proximal phalanx. Electronically Signed    By: Marlan Palau M.D.   On: 03/10/2018 11:00    Procedures Procedures (including critical care time)  Medications Ordered in ED Medications  HYDROcodone-acetaminophen (NORCO/VICODIN) 5-325 MG per tablet 1 tablet (1 tablet Oral Given 03/10/18 1046)     Initial Impression / Assessment and Plan / ED Course  I have reviewed the triage vital signs and the nursing notes.  Pertinent labs & imaging results that were available during my care of the patient were reviewed by me and considered in my medical decision making (see chart for details).     52 y.o. male with atraumatic left wrist pain.  Patient's exam is with some swelling of the wrist as seen above.  Compartments are soft and he is neurovascular intact.  Patient denies any IV drug use.  No personal history of immunocompromise.  Patient is without fever.  There is no joint erythema or heat.  Patient has intact range of motion of the joint.  X-rays are obtained without evidence of fracture or dislocation of the wrist.  There is no joint effusion.  I have discussed the case with my attending, Dr. Erma Heritage.  I do not suspect septic joint at this time.  No indication for joint tap.  Possible new onset gout.  Patient also with question chronic fracture on first phalanx.  He has no tenderness of this digit.  Will treat with wrist splint and anti-inflammatories.  Will have patient follow-up with PCP versus orthopedics. Specific return precautions discussed. Time was given for all questions to be answered. The patient verbalized understanding and agreement with plan. The patient appears safe for discharge home.   Final Clinical Impressions(s) / ED Diagnoses   Final diagnoses:  Left wrist pain    ED Discharge Orders         Ordered    naproxen (NAPROSYN) 500 MG tablet  2 times daily     03/10/18 1124           Princella Pellegrini 03/10/18 1124    Shaune Pollack, MD 03/11/18 1118

## 2018-10-16 ENCOUNTER — Emergency Department (HOSPITAL_COMMUNITY)
Admission: EM | Admit: 2018-10-16 | Discharge: 2018-10-16 | Disposition: A | Discharge status: Home / Self Care | Payer: No Typology Code available for payment source | Attending: Emergency Medicine | Admitting: Emergency Medicine

## 2018-10-16 ENCOUNTER — Other Ambulatory Visit: Payer: Self-pay

## 2018-10-16 ENCOUNTER — Emergency Department (HOSPITAL_COMMUNITY): Payer: No Typology Code available for payment source

## 2018-10-16 ENCOUNTER — Encounter (HOSPITAL_COMMUNITY): Payer: Self-pay | Admitting: *Deleted

## 2018-10-16 DIAGNOSIS — Z79899 Other long term (current) drug therapy: Secondary | ICD-10-CM | POA: Insufficient documentation

## 2018-10-16 DIAGNOSIS — Z720 Tobacco use: Secondary | ICD-10-CM | POA: Insufficient documentation

## 2018-10-16 DIAGNOSIS — R0789 Other chest pain: Secondary | ICD-10-CM | POA: Insufficient documentation

## 2018-10-16 DIAGNOSIS — I10 Essential (primary) hypertension: Secondary | ICD-10-CM | POA: Insufficient documentation

## 2018-10-16 DIAGNOSIS — R079 Chest pain, unspecified: Secondary | ICD-10-CM

## 2018-10-16 LAB — BASIC METABOLIC PANEL
Anion gap: 12 (ref 5–15)
BUN: 10 mg/dL (ref 6–20)
CO2: 23 mmol/L (ref 22–32)
Calcium: 9.3 mg/dL (ref 8.9–10.3)
Chloride: 103 mmol/L (ref 98–111)
Creatinine, Ser: 1.13 mg/dL (ref 0.61–1.24)
GFR calc Af Amer: >60 mL/min (ref >60)
GFR calc non Af Amer: >60 mL/min (ref >60)
Glucose, Bld: 141 mg/dL — ABNORMAL HIGH (ref 70–99)
Potassium: 3.4 mmol/L — ABNORMAL LOW (ref 3.5–5.1)
Sodium: 138 mmol/L (ref 135–145)

## 2018-10-16 LAB — TROPONIN I (HIGH SENSITIVITY)
Troponin I (High Sensitivity): 5 ng/L (ref <18)
Troponin I (High Sensitivity): 4 ng/L (ref <18)

## 2018-10-16 LAB — CBC
HCT: 43.1 % (ref 39.0–52.0)
Hemoglobin: 13.4 g/dL (ref 13.0–17.0)
MCH: 28.9 pg (ref 26.0–34.0)
MCHC: 31.1 g/dL (ref 30.0–36.0)
MCV: 92.9 fL (ref 80.0–100.0)
Platelets: 312 10*3/uL (ref 150–400)
RBC: 4.64 MIL/uL (ref 4.22–5.81)
RDW: 14.1 % (ref 11.5–15.5)
WBC: 5.1 10*3/uL (ref 4.0–10.5)
nRBC: 0 % (ref 0.0–0.2)

## 2018-10-16 MED ORDER — SODIUM CHLORIDE 0.9% FLUSH: 3.0000 mL | Freq: Once | INTRAVENOUS | Status: DC

## 2018-10-16 NOTE — ED Provider Notes (Signed)
[facility] EMERGENC[facility]PARTMENT Provider Note   CSN: [id number] Arrival date & time: 10/16/18  0830     History   Chief Complaint Chief Complaint  Patient presents with  . Chest Pain    HPI Robert Carney is a 53 y.o. male.     HPI Patient presents to the emergency department with shortness of breath mainly when doing activities.  The patient states he does not necessarily have chest pain.  He states is been ongoing over the last month.  He states he did have several episodes of diarrhea 2 weeks ago but those have resolved.  Patient states that he vomited twice during those episodes.  Patient states that he has had no other symptoms.  The nursing note mention chest pain but he states he is not had any chest pain is much as it is been more shortness of breath with exertion.  Patient denies any diaphoresis or other symptoms.  The patient denies chest pain,  headache,blurred vision, neck pain, fever, cough, weakness, numbness, dizziness, anorexia, edema, abdominal pain, nausea, vomiting, diarrhea, rash, back pain, dysuria, hematemesis, bloody stool, near syncope, or syncope. Past Medical History:  Diagnosis Date  . Cervical myelopathy (HCC) 11/01/2016  . Gait abnormality 11/01/2016  . Hypertension    no longer taking meds    Patient Active Problem List   Diagnosis Date Noted  . Cervical myelopathy (HCC) 11/01/2016  . Gait abnormality 11/01/2016  . Elevated blood protein 10/02/2016  . Cervical spondylosis with myelopathy 03/26/2016  . Hyperglycemia 03/26/2016  . Polysubstance abuse (HCC) 03/26/2016  . ETOH abuse 03/26/2016  . Hypertensive urgency   . Lower extremity weakness 03/21/2016  . HYPERTENSION, BENIGN 01/30/2010  . 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 CHEST PAIN-UNSPECIFIED 01/30/2010  . ABNORMAL ELECTROCARDIOGRAM 01/30/2010    Past Surgical History:  Procedure Laterality Date  . ANTERIOR CERVICAL CORPECTOMY N/A 03/26/2016   Procedure: Cervical  Seven Anterior cervical corpectomy;  Surgeon: Loura HaltBenjamin Jared Ditty, MD;  [facility];  Service: Neurosurgery;  Laterality: N/A;  Cervical  Seven Anterior cervical corpectomy  . tooth pulled          Home Medications    Prior to Admission medications   Medication Sig Start Date End Date Taking? Authorizing Provider  amLODipine (NORVASC) 5 MG tablet Take 1 tablet (5 mg total) by mouth daily. 03/27/16   Ditty, Loura HaltBenjamin Jared, MD  gabapentin (NEURONTIN) 300 MG capsule Take 1 capsule (300 mg total) by mouth 3 (three) times daily. 03/27/16   Ditty, Loura HaltBenjamin Jared, MD  hydrochlorothiazide (HYDRODIURIL) 25 MG tablet Take 1 tablet (25 mg total) by mouth daily. 03/28/16   Ditty, Loura HaltBenjamin Jared, MD  naproxen (NAPROSYN) 500 MG tablet Take 1 tablet (500 mg total) by mouth 2 (two) times daily. 03/10/18   Maczis, Elmer SowMichael M, PA-C  vitamin B-12 (CYANOCOBALAMIN) 100 MCG tablet Take 100 mcg by mouth daily.    [provider]    Family History Family History  Problem Relation Age of Onset  . Diabetes Mother   . Lung cancer Mother   . Hypertension Mother   . Prostate cancer Father   . Hypertension Father   . Breast cancer Sister   . Breast cancer Maternal Aunt     Social History Social History   Tobacco Use  . Smoking status: Current Some Day Smoker    Packs/day: 0.20    Types: Cigarettes  . Smokeless tobacco: Never Used  Substance Use Topics  .  Alcohol use: Yes    Alcohol/week: 42.0 standard drinks    Types: 42 Cans of beer per week    Comment: Daily beer drinker  . Drug use: Yes    Types: Marijuana    Comment: No history of IV drug use.     Allergies   No known allergies   Review of Systems Review of Systems All other systems negative except as documented in the HPI. All pertinent positives and negatives as reviewed in the HPI.  Physical Exam Updated Vital Signs BP (!) 172/95   Pulse (!) 49   Temp 99.1 F (37.3 C) (Oral)   Resp 17   Ht 6'  (1.829 m)   Wt 68 kg   SpO2 100%   BMI 20.34 kg/m   Physical Exam Vitals signs and nursing note reviewed.  Constitutional:      General: He is not in acute distress.    Appearance: He is well-developed.  HENT:     Head: Normocephalic and atraumatic.  Eyes:     Pupils: Pupils are equal, round, and reactive to light.  Neck:     Musculoskeletal: Normal range of motion and neck supple.  Cardiovascular:     Rate and Rhythm: Normal rate and regular rhythm.     Heart sounds: Normal heart sounds. No murmur. No friction rub. No gallop.   Pulmonary:     Effort: Pulmonary effort is normal. No respiratory distress.     Breath sounds: Normal breath sounds. No wheezing.  Abdominal:     General: Bowel sounds are normal. There is no distension.     Palpations: Abdomen is soft.     Tenderness: There is no abdominal tenderness.  Skin:    General: Skin is warm and dry.     Capillary Refill: Capillary refill takes less than 2 seconds.     Findings: No erythema or rash.  Neurological:     Mental Status: He is alert and oriented to person, place, and time.     Motor: No abnormal muscle tone.     Coordination: Coordination normal.  Psychiatric:        Behavior: Behavior normal.      ED Treatments / Results  Labs (all labs ordered are listed, but only abnormal results are displayed) Labs Reviewed  BASIC METABOLIC PANEL - Abnormal; Notable for the following components:      Result Value   Potassium 3.4 (*)    Glucose, Bld 141 (*)    All other components within normal limits  CBC  TROPONIN I (HIGH SENSITIVITY)  TROPONIN I (HIGH SENSITIVITY)    EKG EKG Interpretation  Date/Time:  Thursday October 16 2018 08:41:53 EDT Ventricular Rate:  75 PR Interval:  152 QRS Duration: 106 QT Interval:  386 QTC Calculation: 431 R Axis:   -56 Text Interpretation:  Normal sinus rhythm Left anterior fascicular block Nonspecific T wave abnormality No significant change since last tracing Confirmed by  Blanchie Dessert [phone]) on 10/16/2018 1:40:49 PM   Radiology Dg Chest 2 View  Result Date: 10/16/2018 CLINICAL DATA:  Mid chest pain and shortness of breath for 1 week. EXAM: CHEST - 2 VIEW COMPARISON:  10/12/2016 bone survey FINDINGS: The cardiomediastinal silhouette is unchanged with normal heart size. No airspace consolidation, edema, pleural effusion, pneumothorax is identified. Prior cervical spine fusion is noted. IMPRESSION: No active cardiopulmonary disease. Electronically Signed   By: Logan Bores M.D.   On: 10/16/2018 09:22    Procedures Procedures (including critical care time)  Medications Ordered in ED Medications  sodium chloride flush (NS) 0.9 % injection 3 mL (has no administration in time range)     Initial Impression / Assessment and Plan / ED Course  I have reviewed the triage vital signs and the nursing notes.  Pertinent labs & imaging results that were available during my care of the patient were reviewed by me and considered in my medical decision making (see chart for details).        The patient's symptoms are atypical and have been ongoing for quite a while.  The patient does not have any claudication.  Patient will be referred to a primary doctor by her case manager.  I feel the patient need further work-up but today has negative enzymes and negative EKG.  The symptoms still could represent a cardiac etiology and that is why further evaluation and work-up as an outpatient will be needed.  Final Clinical Impressions(s) / ED Diagnoses   Final diagnoses:  None    ED Discharge Orders    None       Charlestine NightLawyer, Mars Scheaffer, PA-C 10/16/18 1525    Gwyneth SproutPlunkett, Whitney, MD 10/18/18 [phone]

## 2018-10-16 NOTE — Discharge Instructions (Addendum)
Follow-up with the clinic provided.  This clinic will see you without insurance.  Return here for any worsening in your condition.

## 2018-10-16 NOTE — ED Triage Notes (Signed)
Pt in c/o mid cp that radiates to bil lower back onset x 3 wks, pt worse with SOB and fatigue x 1.5 wks, pt denies current n/v/d, pt reports having diarrhea x 2 wks ago that has since subsided with emesis and then stopped, pt A&O x4

## 2018-10-16 NOTE — ED Notes (Signed)
Patient verbalized understanding of discharge instructions. Opportunities for questioning and answers were provided. Armband removed by staff. Patient removed from ED.   

## 2018-10-28 ENCOUNTER — Other Ambulatory Visit: Payer: Self-pay

## 2018-10-28 ENCOUNTER — Ambulatory Visit (INDEPENDENT_AMBULATORY_CARE_PROVIDER_SITE_OTHER): Payer: No Typology Code available for payment source | Admitting: Primary Care

## 2018-10-28 ENCOUNTER — Encounter (INDEPENDENT_AMBULATORY_CARE_PROVIDER_SITE_OTHER): Payer: Self-pay | Admitting: Primary Care

## 2018-10-28 VITALS — BP 153/94 | HR 78 | Temp 98.9°F | Ht 72.0 in | Wt 154.0 lb

## 2018-10-28 DIAGNOSIS — Z6820 Body mass index (BMI) 20.0-20.9, adult: Secondary | ICD-10-CM

## 2018-10-28 DIAGNOSIS — R5383 Other fatigue: Secondary | ICD-10-CM

## 2018-10-28 DIAGNOSIS — F17200 Nicotine dependence, unspecified, uncomplicated: Secondary | ICD-10-CM

## 2018-10-28 DIAGNOSIS — Z7689 Persons encountering health services in other specified circumstances: Secondary | ICD-10-CM

## 2018-10-28 DIAGNOSIS — Z1211 Encounter for screening for malignant neoplasm of colon: Secondary | ICD-10-CM

## 2018-10-28 DIAGNOSIS — Z23 Encounter for immunization: Secondary | ICD-10-CM

## 2018-10-28 DIAGNOSIS — I10 Essential (primary) hypertension: Secondary | ICD-10-CM

## 2018-10-28 MED ORDER — HYDROCHLOROTHIAZIDE 25 MG PO TABS: 25.0000 mg | ORAL_TABLET | Freq: Every day | ORAL | 3 refills | Status: DC

## 2018-10-28 MED ORDER — GABAPENTIN 300 MG PO CAPS: 300.0000 mg | ORAL_CAPSULE | Freq: Three times a day (TID) | ORAL | 2 refills | Status: DC

## 2018-10-28 MED ORDER — AMLODIPINE BESYLATE 10 MG PO TABS: 10.0000 mg | ORAL_TABLET | Freq: Every day | ORAL | 3 refills | Status: DC

## 2018-10-28 MED ORDER — METOPROLOL TARTRATE 25 MG PO TABS: 25.0000 mg | ORAL_TABLET | Freq: Two times a day (BID) | ORAL | 3 refills | Status: DC

## 2018-10-28 MED FILL — METOPROLOL TARTRATE 25 MG T: 25 | 30 days supply | Qty: 60 | Fill #0

## 2018-10-28 MED FILL — AMLODIPINE BESYLATE 10 MG T: 10 | 30 days supply | Qty: 30 | Fill #0

## 2018-10-28 MED FILL — HYDROCHLOROTHIAZIDE 25 MG T: 25 | 30 days supply | Qty: 30 | Fill #0

## 2018-10-28 MED FILL — GABAPENTIN 300 MG CAPSULE: 300 | 30 days supply | Qty: 90 | Fill #0

## 2018-10-28 NOTE — Patient Instructions (Signed)

## 2018-10-28 NOTE — Progress Notes (Signed)
New Patient Office Visit  Subjective:  Patient ID: Robert Carney, male    DOB: [birth year]  Age: 53 y.o. MRN: [id number]  CC: No chief complaint on file.   HPI Robert Carney presents for for a hospital follow-up and establishment of care patient presented to the emergency room on October 16, 2018 with complaints of shortness of breath when doing activities intermittent chest pain for the last month.  Also has several episodes of diarrhea now resolved and emesis.   Past Medical History:  Diagnosis Date  . Cervical myelopathy ([place]) 11/01/2016  . Gait abnormality 11/01/2016  . Hypertension    no longer taking meds    Past Surgical History:  Procedure Laterality Date  . ANTERIOR CERVICAL CORPECTOMY N/A 03/26/2016   Procedure: Cervical  Seven Anterior cervical corpectomy;  Surgeon: Kevan Ny Ditty, MD;  Location: [facility];  Service: Neurosurgery;  Laterality: N/A;  Cervical  Seven Anterior cervical corpectomy  . tooth pulled      Family History  Problem Relation Age of Onset  . Diabetes Mother   . Lung cancer Mother   . Hypertension Mother   . Prostate cancer Father   . Hypertension Father   . Breast cancer Sister   . Breast cancer Maternal Aunt     Social History   Socioeconomic History  . Marital status: Single    Spouse name: Not on file  . Number of children: 4  . Years of education: 3  . Highest education level: Not on file  Occupational History  . 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 Financial resource strain: Not on file  . Food insecurity    Worry: Not on file    Inability: Not on file  . Transportation needs    Medical: Not on file    Non-medical: Not on file  Tobacco Use  . Smoking status: Current Some Day Smoker    Packs/day: 0.20    Types: Cigarettes  . Smokeless tobacco: Never Used  Substance and Sexual Activity  . Alcohol use: Yes    Alcohol/week: 42.0 standard drinks    Types: 42 Cans of beer per week    Comment: Daily  beer drinker  . Drug use: Yes    Types: Marijuana    Comment: No history of IV drug use.  Marland Kitchen Sexual activity: Not on file  Lifestyle  . Physical activity    Days per week: Not on file    Minutes per session: Not on file  . Stress: Not on file  Relationships  . Social Herbalist on phone: Not on file    Gets together: Not on file    Attends religious service: Not on file    Active member of club or organization: Not on file    Attends meetings of clubs or organizations: Not on file    Relationship status: Not on file  . Intimate partner violence    Fear of current or ex partner: Not on file    Emotionally abused: Not on file    Physically abused: Not on file    Forced sexual activity: Not on file  Other Topics Concern  . Not on file  Social History Narrative   Lives with mother, Robert Carney   Caffeine use: sometimes   Right handed    ROS Review of Systems  Constitutional: Positive for fatigue.    Objective:   Today's Vitals: BP (!) 153/94 (BP Location: Left Arm, Patient  Position: Sitting, Cuff Size: Normal)   Pulse 78   Temp 98.9 F (37.2 C) (Oral)   Ht 6' (1.829 m)   Wt 154 lb (69.9 kg)   SpO2 99%   BMI 20.89 kg/m   Physical Exam Constitutional:      Appearance: Normal appearance.  HENT:     Head: Normocephalic.     Right Ear: Tympanic membrane normal.     Left Ear: Tympanic membrane normal.  Eyes:     Extraocular Movements: Extraocular movements intact.     Pupils: Pupils are equal, round, and reactive to light.  Neck:     Musculoskeletal: Normal range of motion.  Cardiovascular:     Rate and Rhythm: Normal rate and regular rhythm.     Pulses: Normal pulses.     Heart sounds: Normal heart sounds.  Abdominal:     General: Abdomen is flat. Bowel sounds are normal.     Palpations: Abdomen is soft.  Musculoskeletal: Normal range of motion.  Skin:    General: Skin is warm and dry.  Neurological:     Mental Status: He is alert and oriented to  person, place, and time.  Psychiatric:        Mood and Affect: Mood normal.        Behavior: Behavior normal.     Assessment & Plan:   Problem List Items Addressed This Visit    HYPERTENSION, BENIGN   Relevant Medications   amLODipine (NORVASC) 10 MG tablet   hydrochlorothiazide (HYDRODIURIL) 25 MG tablet   metoprolol tartrate (LOPRESSOR) 25 MG tablet   Other Relevant Orders   Lipid panel   CMP14+EGFR    Other Visit Diagnoses    Encounter to establish care    -  Primary   Relevant Orders   Lipid panel   Tobacco dependence       Relevant Orders   CBC with Differential/Platelet   BMI 20.0-20.9, adult       Relevant Orders   CMP14+EGFR   Fatigue, unspecified type       Relevant Orders   TSH + free T4      Outpatient Encounter Medications as of 10/28/2018  Medication Sig  . amLODipine (NORVASC) 10 MG tablet Take 1 tablet (10 mg total) by mouth daily.  Marland Kitchen gabapentin (NEURONTIN) 300 MG capsule Take 1 capsule (300 mg total) by mouth 3 (three) times daily.  . hydrochlorothiazide (HYDRODIURIL) 25 MG tablet Take 1 tablet (25 mg total) by mouth daily.  . metoprolol tartrate (LOPRESSOR) 25 MG tablet Take 1 tablet (25 mg total) by mouth 2 (two) times daily.  . naproxen (NAPROSYN) 500 MG tablet Take 1 tablet (500 mg total) by mouth 2 (two) times daily. (Patient not taking: Reported on 10/28/2018)  . vitamin B-12 (CYANOCOBALAMIN) 100 MCG tablet Take 100 mcg by mouth daily.  . [DISCONTINUED] amLODipine (NORVASC) 5 MG tablet Take 1 tablet (5 mg total) by mouth daily. (Patient not taking: Reported on 10/28/2018)  . [DISCONTINUED] gabapentin (NEURONTIN) 300 MG capsule Take 1 capsule (300 mg total) by mouth 3 (three) times daily. (Patient not taking: Reported on 10/28/2018)  . [DISCONTINUED] hydrochlorothiazide (HYDRODIURIL) 25 MG tablet Take 1 tablet (25 mg total) by mouth daily. (Patient not taking: Reported on 10/28/2018)   No facility-administered encounter medications on file as of  10/28/2018.    Diagnoses and all orders for this visit:  Encounter to establish care -     Lipid panel; Future  HYPERTENSION, BENIGN Counseled on blood pressure  goal of less than 130/80, low-sodium, DASH diet, medication compliance, 150 minutes of moderate intensity exercise per week. Discussed medication compliance, adverse effects. -     Lipid panel; Future -     CMP14+EGFR; Future  Tobacco dependence Nicotine affect every organ in the body second leading cause of death.  Increased risk for lung cancer and other respiratory diseases recommend cessation.  This will be reminded at each clinical visit.   -     CBC with Differential/Platelet; Future  BMI 20.0-20.9, adult  Thin built below recommended body weight for a adult. Encourage to increase protein in diet with fruits and vegetable. May drink nutritional shakes. -     CMP14+EGFR; Future  Fatigue, unspecified type Fatigue, unspecified type Pace yourself, Plan your day,Include naps and breaks schedule a relaxing day, get a little exercise,fuel the body, consider complementary therapies, deep breathing, prayer/medication and guided meditation Labs to be checked CBC, TSH/T4, B12 -     Cancel: TSH + free T4 -     TSH + free T4; Future  Need for Tdap vaccination -     Tdap vaccine greater than or equal to 7yo IM  Special screening for malignant neoplasms, colon Normal colon cancer screening.  CDC recommends colorectal screening from ages 43-75. Screening can begin at 54 or earlier in some cases. This screening is used for a disease when no symptoms are present . Diagnostic test is used for symptoms examples blood in stool, colorectal polyps or coloector cancer, family history or inflammatory bowel disease - chron's or ulcerative colitis .(USPSTF) -     Fecal occult blood, imunochemical([facility]/Sun[facility]); Future -     Fecal occult blood, imunochemical([facility]/Sun[facility])  Other orders -     amLODipine (NORVASC) 10 MG tablet; Take 1  tablet (10 mg total) by mouth daily. -     gabapentin (NEURONTIN) 300 MG capsule; Take 1 capsule (300 mg total) by mouth 3 (three) times daily. -     hydrochlorothiazide (HYDRODIURIL) 25 MG tablet; Take 1 tablet (25 mg total) by mouth daily. -     metoprolol tartrate (LOPRESSOR) 25 MG tablet; Take 1 tablet (25 mg total) by mouth 2 (two) times daily.   Follow-up: Return in about 3 years (around 10/27/2021) for Blood pressure re check and fasting labs.   Kerin Perna, NP

## 2018-10-30 LAB — FECAL OCCULT BLOOD, IMMUNOCHEMICAL: Fecal Occult Bld: NEGATIVE

## 2018-10-31 ENCOUNTER — Telehealth (INDEPENDENT_AMBULATORY_CARE_PROVIDER_SITE_OTHER): Payer: Self-pay

## 2018-10-31 NOTE — Telephone Encounter (Signed)
-----   Message from Kerin Perna, NP sent at 10/30/2018  4:52 PM EDT ----- FOBT negative recheck in 1 year

## 2018-10-31 NOTE — Telephone Encounter (Signed)
Patient verified date of birth and was then informed that FOBT was negative. Repeat in one year. Patient expressed understanding. Nat Christen, CMA

## 2018-11-18 ENCOUNTER — Other Ambulatory Visit: Payer: Self-pay

## 2018-11-18 ENCOUNTER — Ambulatory Visit (INDEPENDENT_AMBULATORY_CARE_PROVIDER_SITE_OTHER): Payer: Self-pay | Admitting: Primary Care

## 2018-11-18 ENCOUNTER — Encounter (INDEPENDENT_AMBULATORY_CARE_PROVIDER_SITE_OTHER): Payer: Self-pay | Admitting: Primary Care

## 2018-11-18 VITALS — BP 131/84 | HR 53 | Temp 97.7°F | Ht 72.0 in | Wt 152.8 lb

## 2018-11-18 DIAGNOSIS — I10 Essential (primary) hypertension: Secondary | ICD-10-CM

## 2018-11-18 DIAGNOSIS — F17200 Nicotine dependence, unspecified, uncomplicated: Secondary | ICD-10-CM

## 2018-11-18 DIAGNOSIS — Z23 Encounter for immunization: Secondary | ICD-10-CM

## 2018-11-18 DIAGNOSIS — Z6820 Body mass index (BMI) 20.0-20.9, adult: Secondary | ICD-10-CM

## 2018-11-18 DIAGNOSIS — G629 Polyneuropathy, unspecified: Secondary | ICD-10-CM

## 2018-11-18 DIAGNOSIS — M545 Low back pain, unspecified: Secondary | ICD-10-CM

## 2018-11-18 DIAGNOSIS — R5383 Other fatigue: Secondary | ICD-10-CM

## 2018-11-18 DIAGNOSIS — F17210 Nicotine dependence, cigarettes, uncomplicated: Secondary | ICD-10-CM

## 2018-11-18 DIAGNOSIS — Z7689 Persons encountering health services in other specified circumstances: Secondary | ICD-10-CM

## 2018-11-18 MED ORDER — HYDROCHLOROTHIAZIDE 25 MG PO TABS: 25.0000 mg | ORAL_TABLET | Freq: Every day | ORAL | 3 refills | Status: DC

## 2018-11-18 MED ORDER — METOPROLOL TARTRATE 25 MG PO TABS: 25.0000 mg | ORAL_TABLET | Freq: Two times a day (BID) | ORAL | 3 refills | Status: DC

## 2018-11-18 MED ORDER — GABAPENTIN 300 MG PO CAPS: 300.0000 mg | ORAL_CAPSULE | Freq: Three times a day (TID) | ORAL | 2 refills | Status: DC

## 2018-11-18 MED ORDER — AMLODIPINE BESYLATE 10 MG PO TABS: 10.0000 mg | ORAL_TABLET | Freq: Every day | ORAL | 0 refills | Status: DC

## 2018-11-18 NOTE — Progress Notes (Signed)
Established Patient Office Visit  Subjective:  Patient ID: Robert Carney, male    DOB: 01-02-1966  Age: 53 y.o. MRN: [id number]  CC: No chief complaint on file.   HPI Robert Carney presents for management of hypertension and fasting labs.  He denies shortness of breath, headaches, chest pain or lower extremity edema.  Past Medical History:  Diagnosis Date  . Cervical myelopathy ([place]) 11/01/2016  . Gait abnormality 11/01/2016  . Hypertension    no longer taking meds    Past Surgical History:  Procedure Laterality Date  . ANTERIOR CERVICAL CORPECTOMY N/A 03/26/2016   Procedure: Cervical  Seven Anterior cervical corpectomy;  Surgeon: Kevan Ny Ditty, MD;  Location: [facility];  Service: Neurosurgery;  Laterality: N/A;  Cervical  Seven Anterior cervical corpectomy  . tooth pulled      Family History  Problem Relation Age of Onset  . Diabetes Mother   . Lung cancer Mother   . Hypertension Mother   . Prostate cancer Father   . Hypertension Father   . Breast cancer Sister   . Breast cancer Maternal Aunt     Social History   Socioeconomic History  . Marital status: Single    Spouse name: Not on file  . Number of children: 4  . Years of education: 37  . Highest education level: Not on file  Occupational History  . 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 Financial resource strain: Not on file  . Food insecurity    Worry: Not on file    Inability: Not on file  . Transportation needs    Medical: Not on file    Non-medical: Not on file  Tobacco Use  . Smoking status: Current Some Day Smoker    Packs/day: 0.20    Types: Cigarettes  . Smokeless tobacco: Never Used  Substance and Sexual Activity  . Alcohol use: Yes    Alcohol/week: 42.0 standard drinks    Types: 42 Cans of beer per week    Comment: Daily beer drinker  . Drug use: Yes    Types: Marijuana    Comment: No history of IV drug use.  Marland Kitchen Sexual activity: Not on file  Lifestyle  .  Physical activity    Days per week: Not on file    Minutes per session: Not on file  . Stress: Not on file  Relationships  . Social Herbalist on phone: Not on file    Gets together: Not on file    Attends religious service: Not on file    Active member of club or organization: Not on file    Attends meetings of clubs or organizations: Not on file    Relationship status: Not on file  . Intimate partner violence    Fear of current or ex partner: Not on file    Emotionally abused: Not on file    Physically abused: Not on file    Forced sexual activity: Not on file  Other Topics Concern  . Not on file  Social History Narrative   Lives with mother, [name]   Caffeine use: sometimes   Right handed    Outpatient Medications Prior to Visit  Medication Sig Dispense Refill  . amLODipine (NORVASC) 10 MG tablet Take 1 tablet (10 mg total) by mouth daily. 90 tablet 3  . gabapentin (NEURONTIN) 300 MG capsule Take 1 capsule (300 mg total) by mouth 3 (three) times daily. 90 capsule 2  .  hydrochlorothiazide (HYDRODIURIL) 25 MG tablet Take 1 tablet (25 mg total) by mouth daily. 30 tablet 3  . metoprolol tartrate (LOPRESSOR) 25 MG tablet Take 1 tablet (25 mg total) by mouth 2 (two) times daily. 60 tablet 3  . naproxen (NAPROSYN) 500 MG tablet Take 1 tablet (500 mg total) by mouth 2 (two) times daily. 30 tablet 0  . vitamin B-12 (CYANOCOBALAMIN) 100 MCG tablet Take 100 mcg by mouth daily.     No facility-administered medications prior to visit.     Allergies  Allergen Reactions  . No Known Allergies     ROS Review of Systems  All other systems reviewed and are negative.     Objective:    Physical Exam  Constitutional: He is oriented to person, place, and time. He appears well-developed and well-nourished.  Thin frame  Neck: Normal range of motion.  Cardiovascular: Normal rate and regular rhythm.  Pulmonary/Chest: Effort normal and breath sounds normal.  Abdominal: Soft.  Bowel sounds are normal.  Musculoskeletal: Normal range of motion.  Neurological: He is oriented to person, place, and time.  Skin: Skin is warm and dry.    BP 131/84 (BP Location: Left Arm, Patient Position: Sitting, Cuff Size: Normal)   Pulse (!) 53   Temp 97.7 F (36.5 C) (Tympanic)   Ht 6' (1.829 m)   Wt 152 lb 12.8 oz (69.3 kg)   SpO2 100%   BMI 20.72 kg/m  Wt Readings from Last 3 Encounters:  11/18/18 152 lb 12.8 oz (69.3 kg)  10/28/18 154 lb (69.9 kg)  10/16/18 150 lb (68 kg)     Health Maintenance Due  Topic Date Due  . COLONOSCOPY  07/28/2014  . INFLUENZA VACCINE  10/18/2018    There are no preventive care reminders to display for this patient.  No results found for: TSH Lab Results  Component Value Date   WBC 5.1 10/16/2018   HGB 13.4 10/16/2018   HCT 43.1 10/16/2018   MCV 92.9 10/16/2018   PLT 312 10/16/2018   Lab Results  Component Value Date   NA 138 10/16/2018   K 3.4 (L) 10/16/2018   CHLORIDE 99 10/02/2016   CO2 23 10/16/2018   GLUCOSE 141 (H) 10/16/2018   BUN 10 10/16/2018   CREATININE 1.13 10/16/2018   BILITOT 0.40 10/02/2016   ALKPHOS 52 10/02/2016   AST 23 10/02/2016   ALT 13 10/02/2016   PROT 8.4 10/02/2016   ALBUMIN 4.6 10/02/2016   CALCIUM 9.3 10/16/2018   ANIONGAP 12 10/16/2018   EGFR 84 (L) 10/02/2016   No results found for: CHOL No results found for: HDL No results found for: LDLCALC No results found for: TRIG No results found for: CHOLHDL No results found for: HGBA1C    Assessment & Plan:   Diagnoses and all orders for this visit:  HYPERTENSION, BENIGN Improving Previous 169/101 & 153/94 For patients younger than 60: Goal BP  130/80 For patients 60 and older: Goal BP < 150/90. For patients with diabetes: Goal BP < 140/90. Your most recent BP:131/84  Take your medications faithfully as instructed. Maintain a healthy weight. Get at least 150 minutes of aerobic exercise per week. Minimize salt intake. Minimize  alcohol intake  -     amLODipine (NORVASC) 10 MG tablet; Take 1 tablet (10 mg total) by mouth daily. -     metoprolol tartrate (LOPRESSOR) 25 MG tablet; Take 1 tablet (25 mg total) by mouth 2 (two) times daily. -     hydrochlorothiazide (  HYDRODIURIL) 25 MG tablet; Take 1 tablet (25 mg total) by mouth daily.  Tobacco dependence Nicotine affect every organ in the body second leading cause of death.  Increased risk for lung cancer and other respiratory diseases recommend cessation.  This will be reminded at each clinical visit. Patient states he is weaning his self off down to no more than 2-3 cigarettes daily  Neuropathy Peripheral neuropathy develops slowly over time leading to a loss of sensation. Burning, stabbing, or aching pain in the legs or feet are common symptoms.This can lead to calluses or sores on areas of constant pressure, reduced ability to feel temperature changes. Diabetic foot exam should be done at each visit.  -     gabapentin (NEURONTIN) 300 MG capsule; Take 1 capsule (300 mg total) by mouth 3 (three) times daily.  Acute bilateral low back pain without sciatica BACK PAIN  Location: lower back bilateral  Quality: 5/10 Onset:  Recent  Worse with: bending     Better with: rest. Radiation: noneTrauma:  Red Flags Fecal/urinary incontinence: no Numbness/Weakness: neuropathy Fever/chills/sweats:no Night pain: yes depending how he lays in the bed No relief with bedrest: YES Recommended ibuprofen '400mg'$  BID prn    Meds ordered this encounter  Medications  . amLODipine (NORVASC) 10 MG tablet    Sig: Take 1 tablet (10 mg total) by mouth daily.    Dispense:  90 tablet    Refill:  0  . metoprolol tartrate (LOPRESSOR) 25 MG tablet    Sig: Take 1 tablet (25 mg total) by mouth 2 (two) times daily.    Dispense:  60 tablet    Refill:  3  . hydrochlorothiazide (HYDRODIURIL) 25 MG tablet    Sig: Take 1 tablet (25 mg total) by mouth daily.    Dispense:  30 tablet    Refill:  3  .  gabapentin (NEURONTIN) 300 MG capsule    Sig: Take 1 capsule (300 mg total) by mouth 3 (three) times daily.    Dispense:  90 capsule    Refill:  2    Follow-up: Return in about 3 months (around 02/17/2019) for HTN.    Kerin Perna, NP

## 2018-11-18 NOTE — Patient Instructions (Signed)
Acute Back Pain, Adult Acute back pain is sudden and usually short-lived. It is often caused by an injury to the muscles and tissues in the back. The injury may result from:  A muscle or ligament getting overstretched or torn (strained). Ligaments are tissues that connect bones to each other. Lifting something improperly can cause a back strain.  Wear and tear (degeneration) of the spinal disks. Spinal disks are circular tissue that provides cushioning between the bones of the spine (vertebrae).  Twisting motions, such as while playing sports or doing yard work.  A hit to the back.  Arthritis. You may have a physical exam, lab tests, and imaging tests to find the cause of your pain. Acute back pain usually goes away with rest and home care. Follow these instructions at home: Managing pain, stiffness, and swelling  Take over-the-counter and prescription medicines only as told by your health care provider.  Your health care provider may recommend applying ice during the first 24-48 hours after your pain starts. To do this: ? Put ice in a plastic bag. ? Place a towel between your skin and the bag. ? Leave the ice on for 20 minutes, 2-3 times a day.  If directed, apply heat to the affected area as often as told by your health care provider. Use the heat source that your health care provider recommends, such as a moist heat pack or a heating pad. ? Place a towel between your skin and the heat source. ? Leave the heat on for 20-30 minutes. ? Remove the heat if your skin turns bright red. This is especially important if you are unable to feel pain, heat, or cold. You have a greater risk of getting burned. Activity   Do not stay in bed. Staying in bed for more than 1-2 days can delay your recovery.  Sit up and stand up straight. Avoid leaning forward when you sit, or hunching over when you stand. ? If you work at a desk, sit close to it so you do not need to lean over. Keep your chin tucked  in. Keep your neck drawn back, and keep your elbows bent at a right angle. Your arms should look like the letter "L." ? Sit high and close to the steering wheel when you drive. Add lower back (lumbar) support to your car seat, if needed.  Take short walks on even surfaces as soon as you are able. Try to increase the length of time you walk each day.  Do not sit, drive, or stand in one place for more than 30 minutes at a time. Sitting or standing for long periods of time can put stress on your back.  Do not drive or use heavy machinery while taking prescription pain medicine.  Use proper lifting techniques. When you bend and lift, use positions that put less stress on your back: ? Bend your knees. ? Keep the load close to your body. ? Avoid twisting.  Exercise regularly as told by your health care provider. Exercising helps your back heal faster and helps prevent back injuries by keeping muscles strong and flexible.  Work with a physical therapist to make a safe exercise program, as recommended by your health care provider. Do any exercises as told by your physical therapist. Lifestyle  Maintain a healthy weight. Extra weight puts stress on your back and makes it difficult to have good posture.  Avoid activities or situations that make you feel anxious or stressed. Stress and anxiety increase muscle   tension and can make back pain worse. Learn ways to manage anxiety and stress, such as through exercise. General instructions  Sleep on a firm mattress in a comfortable position. Try lying on your side with your knees slightly bent. If you lie on your back, put a pillow under your knees.  Follow your treatment plan as told by your health care provider. This may include: ? Cognitive or behavioral therapy. ? Acupuncture or massage therapy. ? Meditation or yoga. Contact a health care provider if:  You have pain that is not relieved with rest or medicine.  You have increasing pain going down  into your legs or buttocks.  Your pain does not improve after 2 weeks.  You have pain at night.  You lose weight without trying.  You have a fever or chills. Get help right away if:  You develop new bowel or bladder control problems.  You have unusual weakness or numbness in your arms or legs.  You develop nausea or vomiting.  You develop abdominal pain.  You feel faint. Summary  Acute back pain is sudden and usually short-lived.  Use proper lifting techniques. When you bend and lift, use positions that put less stress on your back.  Take over-the-counter and prescription medicines and apply heat or ice as directed by your health care provider. This information is not intended to replace advice given to you by your health care provider. Make sure you discuss any questions you have with your health care provider. Document Released: 03/05/2005 Document Revised: 06/24/2018 Document Reviewed: 10/17/2016 Elsevier Patient Education  2020 [employer]  

## 2018-11-19 ENCOUNTER — Telehealth (INDEPENDENT_AMBULATORY_CARE_PROVIDER_SITE_OTHER): Payer: Self-pay

## 2018-11-19 LAB — CMP14+EGFR
ALT: 9 IU/L (ref 0–44)
AST: 20 IU/L (ref 0–40)
Albumin/Globulin Ratio: 1.4 (ref 1.2–2.2)
Albumin: 4.8 g/dL (ref 3.8–4.9)
Alkaline Phosphatase: 53 IU/L (ref 39–117)
BUN/Creatinine Ratio: 13 (ref 9–20)
BUN: 15 mg/dL (ref 6–24)
Bilirubin Total: 0.5 mg/dL (ref 0.0–1.2)
CO2: 25 mmol/L (ref 20–29)
Calcium: 10.1 mg/dL (ref 8.7–10.2)
Chloride: 96 mmol/L (ref 96–106)
Creatinine, Ser: 1.19 mg/dL (ref 0.76–1.27)
GFR calc Af Amer: 80 mL/min/{1.73_m2} (ref >59)
GFR calc non Af Amer: 69 mL/min/{1.73_m2} (ref >59)
Globulin, Total: 3.4 g/dL (ref 1.5–4.5)
Glucose: 104 mg/dL — ABNORMAL HIGH (ref 65–99)
Potassium: 4.8 mmol/L (ref 3.5–5.2)
Sodium: 137 mmol/L (ref 134–144)
Total Protein: 8.2 g/dL (ref 6.0–8.5)

## 2018-11-19 LAB — LIPID PANEL
Chol/HDL Ratio: 3.7 ratio (ref 0.0–5.0)
Cholesterol, Total: 205 mg/dL — ABNORMAL HIGH (ref 100–199)
HDL: 56 mg/dL (ref >39)
LDL Chol Calc (NIH): 119 mg/dL — ABNORMAL HIGH (ref 0–99)
Triglycerides: 173 mg/dL — ABNORMAL HIGH (ref 0–149)
VLDL Cholesterol Cal: 30 mg/dL (ref 5–40)

## 2018-11-19 LAB — CBC WITH DIFFERENTIAL/PLATELET
Basophils Absolute: 0.1 10*3/uL (ref 0.0–0.2)
Basos: 1 %
EOS (ABSOLUTE): 0.2 10*3/uL (ref 0.0–0.4)
Eos: 3 %
Hematocrit: 43.6 % (ref 37.5–51.0)
Hemoglobin: 14.5 g/dL (ref 13.0–17.7)
Immature Grans (Abs): 0 10*3/uL (ref 0.0–0.1)
Immature Granulocytes: 0 %
Lymphocytes Absolute: 2.1 10*3/uL (ref 0.7–3.1)
Lymphs: 38 %
MCH: 29.5 pg (ref 26.6–33.0)
MCHC: 33.3 g/dL (ref 31.5–35.7)
MCV: 89 fL (ref 79–97)
Monocytes Absolute: 0.6 10*3/uL (ref 0.1–0.9)
Monocytes: 11 %
Neutrophils Absolute: 2.6 10*3/uL (ref 1.4–7.0)
Neutrophils: 47 %
Platelets: 304 10*3/uL (ref 150–450)
RBC: 4.92 x10E6/uL (ref 4.14–5.80)
RDW: 12.8 % (ref 11.6–15.4)
WBC: 5.6 10*3/uL (ref 3.4–10.8)

## 2018-11-19 LAB — TSH+FREE T4
Free T4: 1.08 ng/dL (ref 0.82–1.77)
TSH: 6.98 u[IU]/mL — ABNORMAL HIGH (ref 0.450–4.500)

## 2018-11-19 NOTE — Telephone Encounter (Signed)
-----   Message from Kerin Perna, NP sent at 11/19/2018 10:48 AM EDT ----- Labs are essentially normal. Will continue to monitor.Your LDL is elevated.  LDL is the bad cholesterol that can lead to heart attack and stroke. To lower your number you can Work on eating a low fat, heart healthy diet and participate in regular aerobic exercise program to control as well. Exercise at least  30 minutes per day-5 days per week. Avoid red meat. No fried foods. No junk foods, sodas, sugary foods or drinks, unhealthy snacking, alcohol or smoking. TSH will continue to monitor your thyroid

## 2018-11-19 NOTE — Telephone Encounter (Signed)
Patient verified date of birth he is aware that Labs are essentially normal. Will continue to monitor.Your LDL is elevated. LDL is the bad cholesterol that can lead to heart attack and stroke. To lower your number you can Work on eating a low fat, heart healthy diet and participate in regular aerobic exercise program to control as well. Exercise at least 30 minutes per day-5 days per week. Avoid red meat. No fried foods. No junk foods, sodas, sugary foods or drinks, unhealthy snacking, alcohol or smoking. TSH will continue to monitor your thyroid. Nat Christen, CMA

## 2018-11-26 MED FILL — GABAPENTIN 300 MG CAPSULE: 300 | 30 days supply | Qty: 90 | Fill #1

## 2018-11-26 MED FILL — HYDROCHLOROTHIAZIDE 25 MG T: 25 | 30 days supply | Qty: 30 | Fill #1

## 2018-11-26 MED FILL — METOPROLOL TARTRATE 25 MG T: 25 | 30 days supply | Qty: 60 | Fill #1

## 2018-11-26 MED FILL — AMLODIPINE BESYLATE 10 MG T: 10 | 30 days supply | Qty: 30 | Fill #1

## 2018-12-25 MED FILL — AMLODIPINE BESYLATE 10 MG T: 10 | 30 days supply | Qty: 30 | Fill #2

## 2018-12-25 MED FILL — METOPROLOL TARTRATE 25 MG T: 25 | 30 days supply | Qty: 60 | Fill #2

## 2018-12-25 MED FILL — GABAPENTIN 300 MG CAPSULE: 300 | 30 days supply | Qty: 90 | Fill #2

## 2018-12-25 MED FILL — HYDROCHLOROTHIAZIDE 25 MG T: 25 | 30 days supply | Qty: 30 | Fill #2

## 2019-01-27 MED FILL — AMLODIPINE BESYLATE 10 MG T: 10 | 30 days supply | Qty: 30 | Fill #3

## 2019-01-28 MED FILL — HYDROCHLOROTHIAZIDE 25 MG T: 25 | 30 days supply | Qty: 30 | Fill #3

## 2019-02-17 ENCOUNTER — Encounter (INDEPENDENT_AMBULATORY_CARE_PROVIDER_SITE_OTHER): Payer: Self-pay | Admitting: Primary Care

## 2019-02-17 ENCOUNTER — Other Ambulatory Visit: Payer: Self-pay

## 2019-02-17 ENCOUNTER — Ambulatory Visit (INDEPENDENT_AMBULATORY_CARE_PROVIDER_SITE_OTHER): Payer: Self-pay | Admitting: Primary Care

## 2019-02-17 DIAGNOSIS — Z13220 Encounter for screening for lipoid disorders: Secondary | ICD-10-CM

## 2019-02-17 DIAGNOSIS — Z125 Encounter for screening for malignant neoplasm of prostate: Secondary | ICD-10-CM

## 2019-02-17 DIAGNOSIS — Z716 Tobacco abuse counseling: Secondary | ICD-10-CM

## 2019-02-17 DIAGNOSIS — I10 Essential (primary) hypertension: Secondary | ICD-10-CM

## 2019-02-17 DIAGNOSIS — F17200 Nicotine dependence, unspecified, uncomplicated: Secondary | ICD-10-CM

## 2019-02-17 DIAGNOSIS — G629 Polyneuropathy, unspecified: Secondary | ICD-10-CM

## 2019-02-17 MED ORDER — GABAPENTIN 300 MG PO CAPS: 300.0000 mg | ORAL_CAPSULE | Freq: Three times a day (TID) | ORAL | 5 refills | Status: DC

## 2019-02-17 MED ORDER — METOPROLOL TARTRATE 25 MG PO TABS: 25.0000 mg | ORAL_TABLET | Freq: Two times a day (BID) | ORAL | 5 refills | Status: DC

## 2019-02-17 MED ORDER — AMLODIPINE BESYLATE 10 MG PO TABS: 10.0000 mg | ORAL_TABLET | Freq: Every day | ORAL | 0 refills | Status: DC

## 2019-02-17 MED ORDER — HYDROCHLOROTHIAZIDE 25 MG PO TABS: 25.0000 mg | ORAL_TABLET | Freq: Every day | ORAL | 3 refills | Status: DC

## 2019-02-17 MED FILL — GABAPENTIN 300 MG CAPSULE: 300 | 30 days supply | Qty: 90 | Fill #0

## 2019-02-17 MED FILL — METOPROLOL TARTRATE 25 MG T: 25 | 30 days supply | Qty: 60 | Fill #0

## 2019-02-17 NOTE — Patient Instructions (Signed)

## 2019-02-17 NOTE — Progress Notes (Signed)
Virtual Visit via Telephone Note  I connected with Robert Carney on 02/17/19 at  8:30 AM EST by telephone and verified that I am speaking with the correct person using two identifiers.   I discussed the limitations, risks, security and privacy concerns of performing an evaluation and management service by telephone and the availability of in person appointments. I also discussed with the patient that there may be a patient responsible charge related to this service. The patient expressed understanding and agreed to proceed.   History of Present Illness: [name] is having a tele visit to monitor Bp, he is able to check blood pressure at home- today was 135/86 and HR 59 He denies shortness of breath, headaches, chest pain or lower extremity edema  Past Medical History:  Diagnosis Date  . Cervical myelopathy ([place]) 11/01/2016  . Gait abnormality 11/01/2016  . Hypertension    no longer taking meds    Observations/Objective: Review of Systems  All other systems reviewed and are negative.   Assessment and Plan: Robert Carney was seen today for hypertension.  Diagnoses and all orders for this visit:  Tobacco dependence Discussed adverse respiratory affects each visit discuss cessation -     Comprehensive metabolic panel; Future  HYPERTENSION, BENIGN Counseled on blood pressure goal of less than 130/80, low-sodium, DASH diet, medication compliance, 150 minutes of moderate intensity exercise per week. Discussed medication compliance, adverse effects. -     amLODipine (NORVASC) 10 MG tablet; Take 1 tablet (10 mg total) by mouth daily. -     metoprolol tartrate (LOPRESSOR) 25 MG tablet; Take 1 tablet (25 mg total) by mouth 2 (two) times daily. -     hydrochlorothiazide (HYDRODIURIL) 25 MG tablet; Take 1 tablet (25 mg total) by mouth daily.  Neuropathy -     gabapentin (NEURONTIN) 300 MG capsule; Take 1 capsule (300 mg total) by mouth 3 (three) times daily. -     Comprehensive  metabolic panel; Future  Lipid screening -     Lipid panel; Future   Essential hypertension -     amLODipine (NORVASC) 10 MG tablet; Take 1 tablet (10 mg total) by mouth daily. -     metoprolol tartrate (LOPRESSOR) 25 MG tablet; Take 1 tablet (25 mg total) by mouth 2 (two) times daily. -     hydrochlorothiazide (HYDRODIURIL) 25 MG tablet; Take 1 tablet (25 mg total) by mouth daily.  Prostate cancer screening -     PSA; Future    Follow Up Instructions:    I discussed the assessment and treatment plan with the patient. The patient was provided an opportunity to ask questions and all were answered. The patient agreed with the plan and demonstrated an understanding of the instructions.   The patient was advised to call back or seek an in-person evaluation if the symptoms worsen or if the condition fails to improve as anticipated.  I provided 17 minutes of non-face-to-face time during this encounter.   Kerin Perna, NP

## 2019-02-17 NOTE — Progress Notes (Signed)
Pt has taken antihypertensives this morning. BP reading at 8:335 135/86 left arm HR 59

## 2019-03-02 MED FILL — AMLODIPINE BESYLATE 10 MG T: 10 | 30 days supply | Qty: 30 | Fill #4

## 2019-03-02 MED FILL — HYDROCHLOROTHIAZIDE 25 MG T: 25 | 30 days supply | Qty: 30 | Fill #0

## 2019-03-23 MED FILL — GABAPENTIN 300 MG CAPSULE: 300 | 30 days supply | Qty: 90 | Fill #1

## 2019-03-23 MED FILL — METOPROLOL TARTRATE 25 MG T: 25 | 30 days supply | Qty: 60 | Fill #1

## 2019-04-02 MED FILL — ?AMLODIPINE BESYLATE 10 MG: 10 | 30 days supply | Qty: 30 | Fill #5

## 2019-04-02 MED FILL — ?HYDROCHLOROTHIAZIDE 25MG T: 25 | 30 days supply | Qty: 30 | Fill #1

## 2019-04-27 MED FILL — GABAPENTIN 300 MG CAPSULE: 300 | 30 days supply | Qty: 90 | Fill #2

## 2019-04-27 MED FILL — METOPROLOL TARTRATE 25 MG T: 25 | 30 days supply | Qty: 60 | Fill #2

## 2019-05-04 MED FILL — AMLODIPINE BESYLATE 10 MG T: 10 | 30 days supply | Qty: 30 | Fill #6

## 2019-05-04 MED FILL — ?HYDROCHLOROTHIAZIDE 25MG T: 25 | 30 days supply | Qty: 30 | Fill #2

## 2019-05-29 MED FILL — AMLODIPINE BESYLATE 10 MG T: 10 | 30 days supply | Qty: 30 | Fill #7

## 2019-05-29 MED FILL — GABAPENTIN 300 MG CAPSULE: 300 | 30 days supply | Qty: 90 | Fill #3

## 2019-05-29 MED FILL — METOPROLOL TARTRATE 25 MG T: 25 | 30 days supply | Qty: 60 | Fill #3

## 2019-05-29 MED FILL — ?HYDROCHLOROTHIAZIDE 25MG T: 25 | 30 days supply | Qty: 30 | Fill #3

## 2019-06-29 MED FILL — METOPROLOL TARTRATE 25 MG T: 25 | 30 days supply | Qty: 60 | Fill #4

## 2019-06-29 MED FILL — AMLODIPINE BESYLATE 10 MG T: 10 | 30 days supply | Qty: 30 | Fill #8

## 2019-06-29 MED FILL — GABAPENTIN 300 MG CAPSULE: 300 | 30 days supply | Qty: 90 | Fill #4

## 2019-06-29 MED FILL — HYDROCHLOROTHIAZIDE 25 MG T: 25 | 30 days supply | Qty: 30 | Fill #0

## 2019-08-31 MED FILL — AMLODIPINE BESYLATE 10 MG T: 10 | 30 days supply | Qty: 30 | Fill #10

## 2019-08-31 MED FILL — GABAPENTIN 300 MG CAPSULE: 300 | 30 days supply | Qty: 90 | Fill #0

## 2019-08-31 MED FILL — HYDROCHLOROTHIAZIDE 25 MG T: 25 | 30 days supply | Qty: 30 | Fill #2

## 2019-08-31 MED FILL — METOPROLOL TARTRATE 25 MG T: 25 | 30 days supply | Qty: 60 | Fill #0

## 2019-09-08 MED FILL — HYDROCHLOROTHIAZIDE 25 MG T: 25 | 30 days supply | Qty: 30 | Fill #2

## 2019-09-08 MED FILL — METOPROLOL TARTRATE 25 MG T: 25 | 30 days supply | Qty: 60 | Fill #0

## 2019-09-08 MED FILL — GABAPENTIN 300 MG CAPSULE: 300 | 30 days supply | Qty: 90 | Fill #0

## 2019-09-08 MED FILL — AMLODIPINE BESYLATE 10 MG T: 10 | 30 days supply | Qty: 30 | Fill #10

## 2019-09-26 IMAGING — CR CHEST - 2 VIEW
2 series · 2 of 2 positions shown · non-contrast
Comparison: 10/12/2016 bone survey

CLINICAL DATA: Mid chest pain and shortness of breath for 1 week.

EXAM:
CHEST - 2 VIEW

[chest pa]
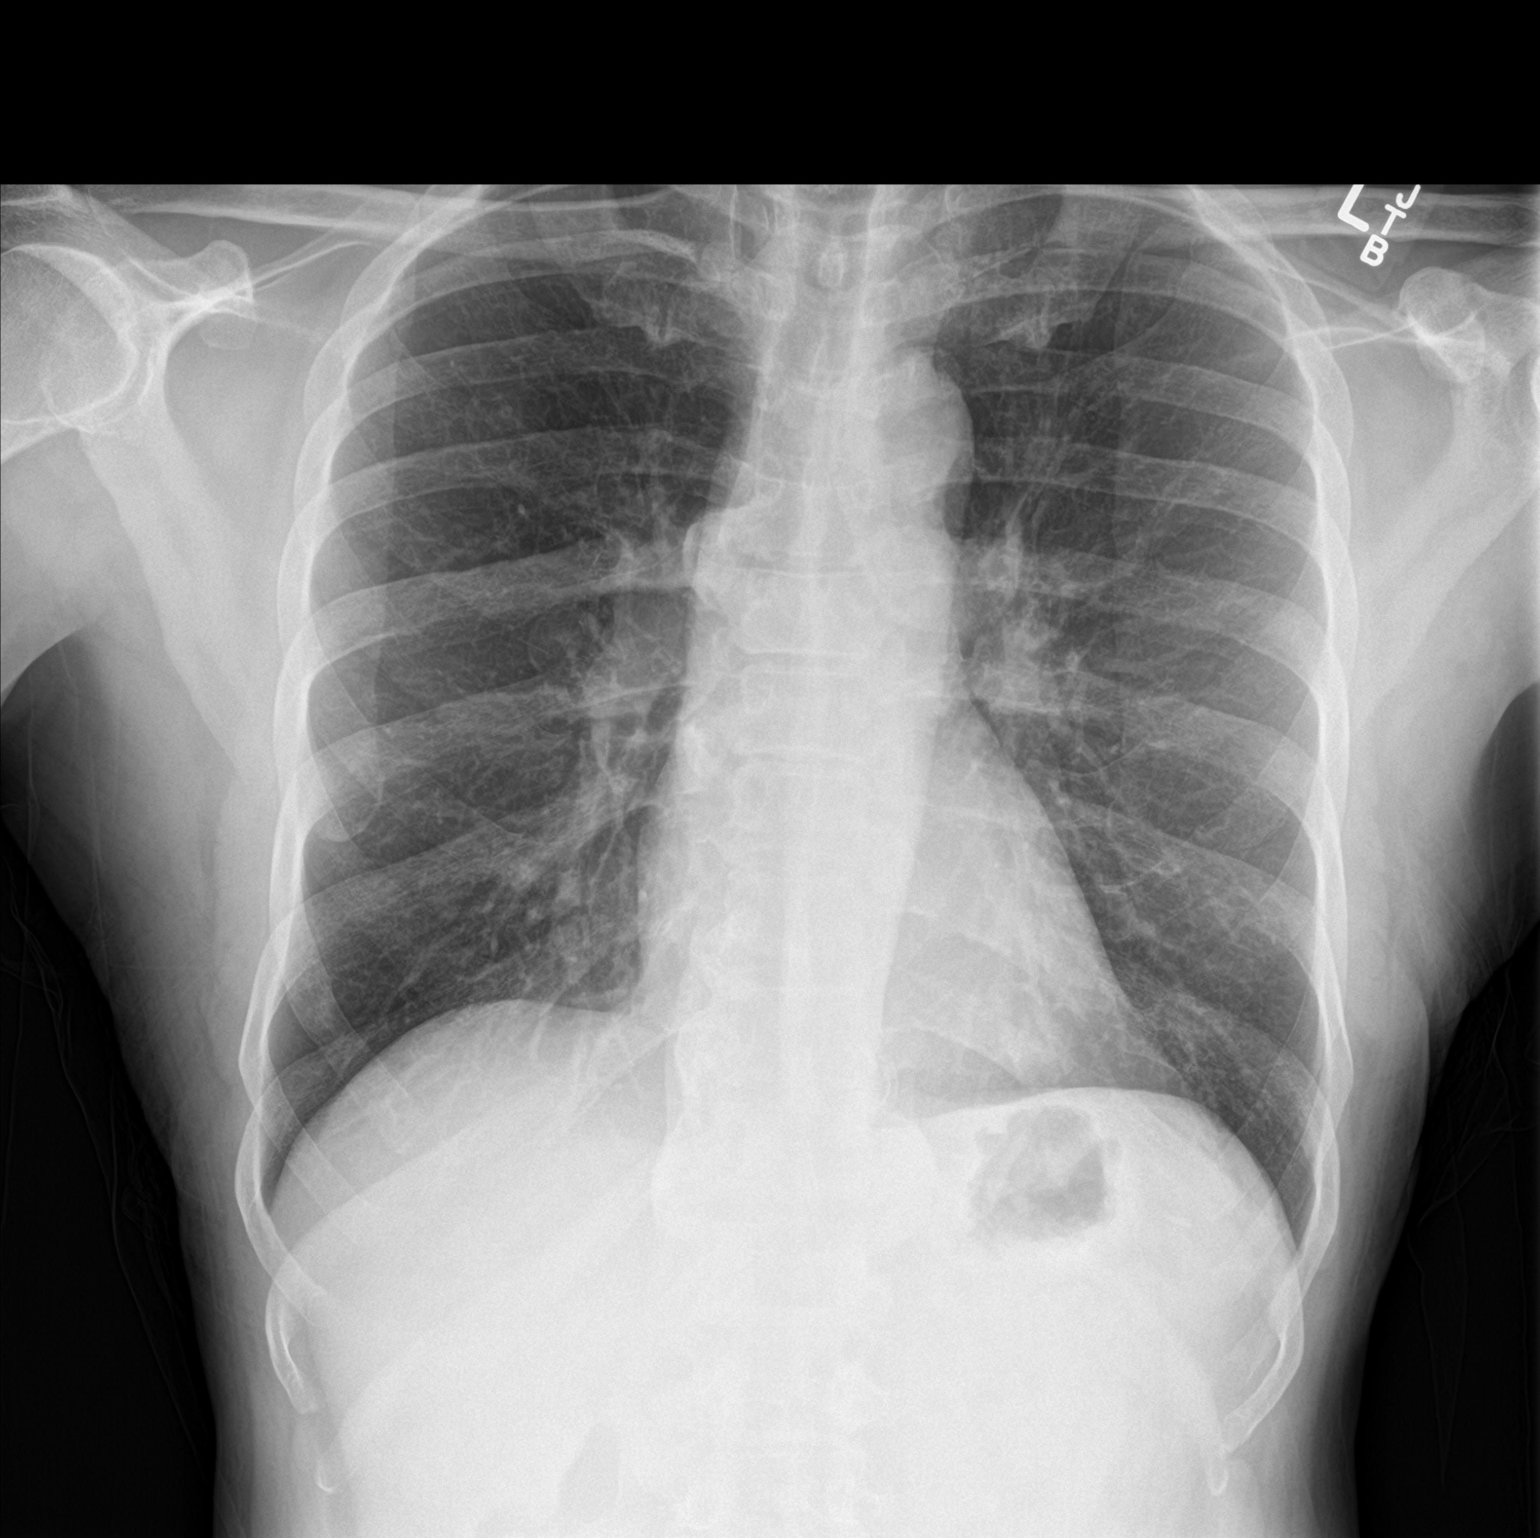

[chest lat]
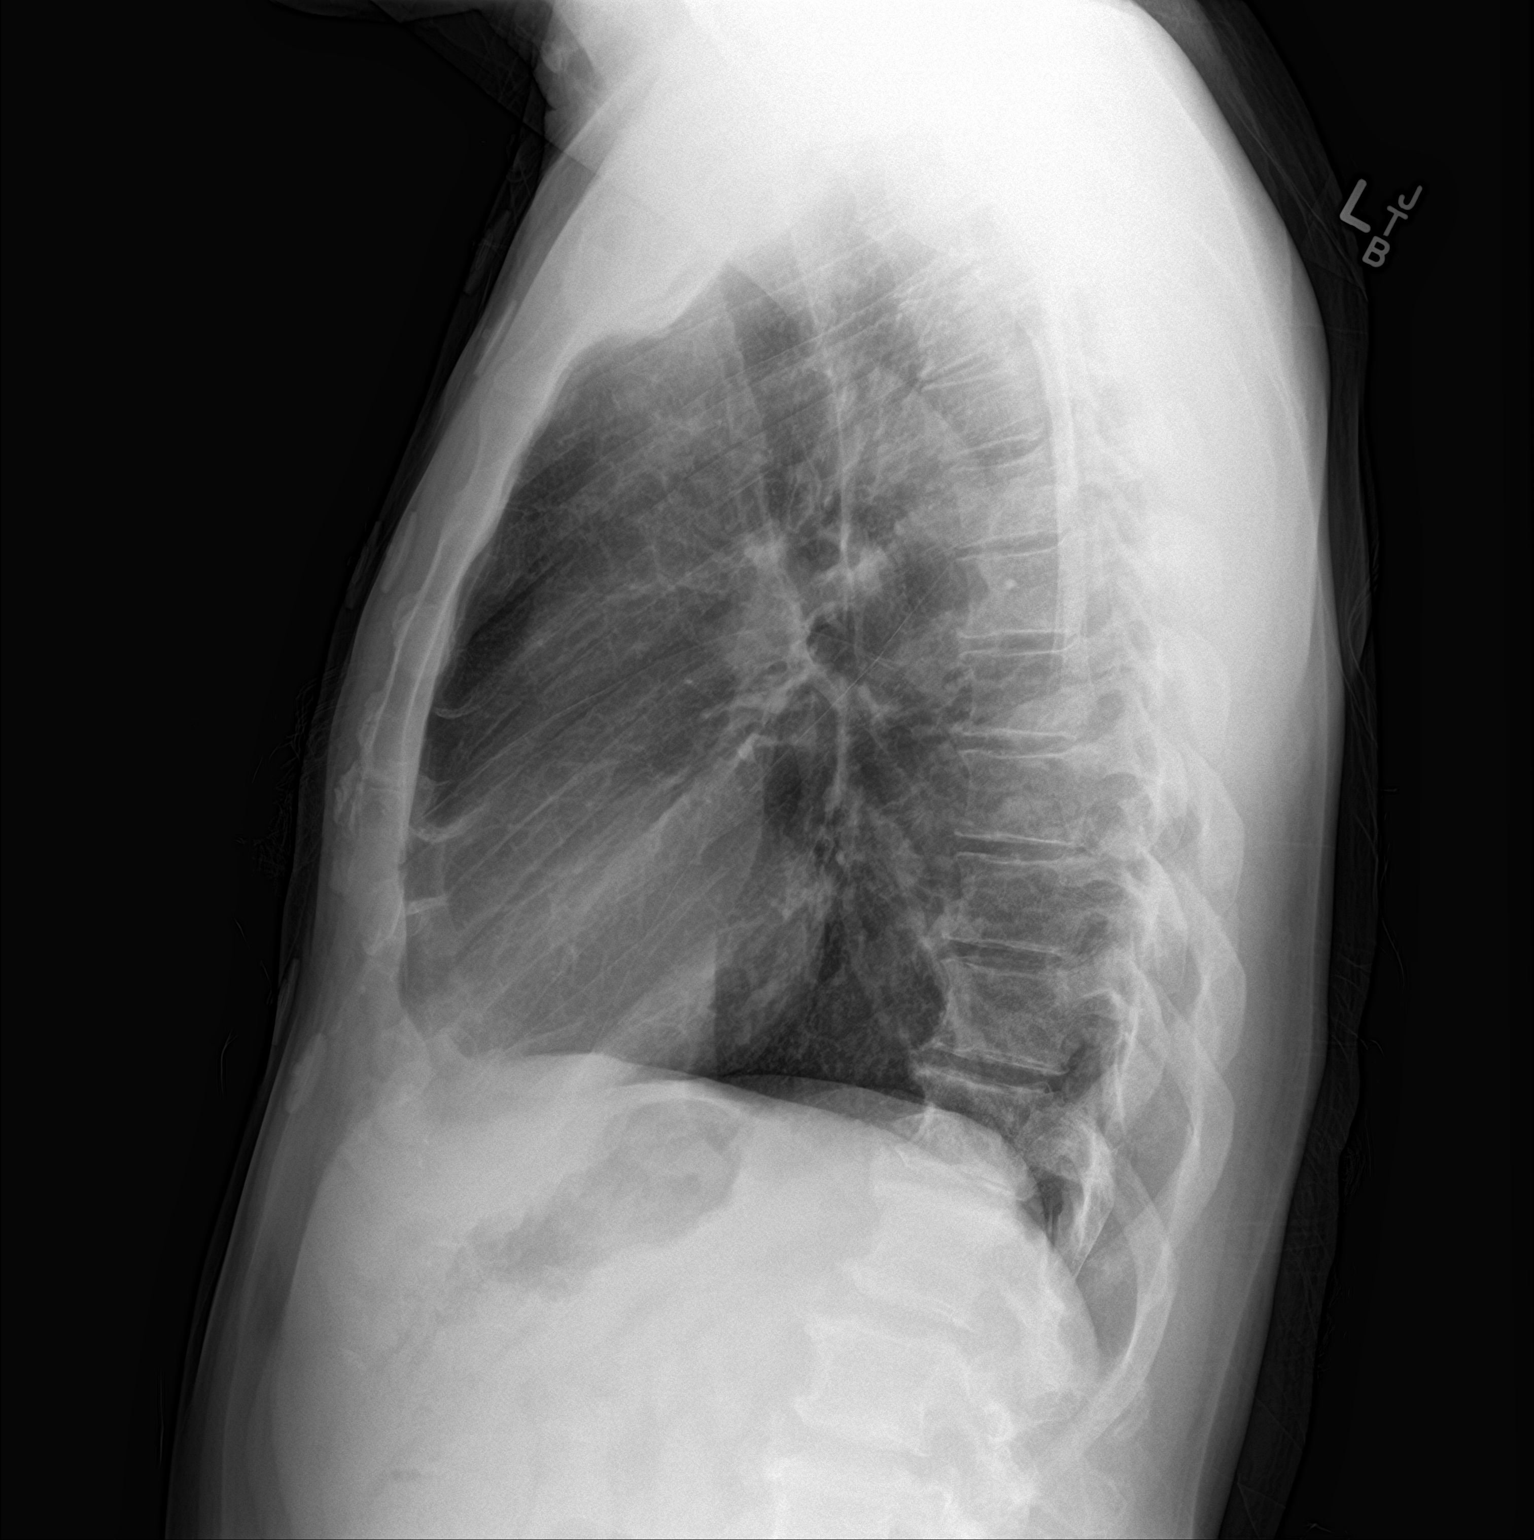

[2 of 2 positions shown; findings below may reference images not displayed]

FINDINGS: The cardiomediastinal silhouette is unchanged with normal heart
size. No airspace consolidation, edema, pleural effusion,
pneumothorax is identified. Prior cervical spine fusion is noted.
IMPRESSION: No active cardiopulmonary disease.

## 2019-10-09 MED FILL — HYDROCHLOROTHIAZIDE 25 MG T: 25 | 30 days supply | Qty: 30 | Fill #3

## 2019-10-09 MED FILL — GABAPENTIN 300 MG CAPSULE: 300 | 30 days supply | Qty: 90 | Fill #1

## 2019-10-09 MED FILL — METOPROLOL TARTRATE 25 MG T: 25 | 30 days supply | Qty: 60 | Fill #1

## 2019-10-09 MED FILL — AMLODIPINE BESYLATE 10 MG T: 10 | 30 days supply | Qty: 30 | Fill #11

## 2019-11-18 ENCOUNTER — Other Ambulatory Visit: Payer: Self-pay

## 2019-11-18 ENCOUNTER — Ambulatory Visit (INDEPENDENT_AMBULATORY_CARE_PROVIDER_SITE_OTHER): Payer: Medicaid Other | Admitting: Primary Care

## 2019-11-18 ENCOUNTER — Other Ambulatory Visit (INDEPENDENT_AMBULATORY_CARE_PROVIDER_SITE_OTHER): Payer: Self-pay | Admitting: Primary Care

## 2019-11-18 ENCOUNTER — Encounter (INDEPENDENT_AMBULATORY_CARE_PROVIDER_SITE_OTHER): Payer: Self-pay | Admitting: Primary Care

## 2019-11-18 VITALS — BP 128/86 | HR 70 | Temp 98.6°F | Ht 72.0 in | Wt 149.2 lb

## 2019-11-18 DIAGNOSIS — M1049 Other secondary gout, multiple sites: Secondary | ICD-10-CM | POA: Diagnosis not present

## 2019-11-18 DIAGNOSIS — I10 Essential (primary) hypertension: Secondary | ICD-10-CM | POA: Diagnosis not present

## 2019-11-18 DIAGNOSIS — G629 Polyneuropathy, unspecified: Secondary | ICD-10-CM

## 2019-11-18 DIAGNOSIS — R269 Unspecified abnormalities of gait and mobility: Secondary | ICD-10-CM

## 2019-11-18 DIAGNOSIS — Z760 Encounter for issue of repeat prescription: Secondary | ICD-10-CM

## 2019-11-18 MED ORDER — AMLODIPINE BESYLATE 10 MG PO TABS: 10.0000 mg | ORAL_TABLET | Freq: Every day | ORAL | 1 refills | Status: DC

## 2019-11-18 MED ORDER — GABAPENTIN 300 MG PO CAPS: 300.0000 mg | ORAL_CAPSULE | Freq: Three times a day (TID) | ORAL | 5 refills | Status: DC

## 2019-11-18 MED ORDER — HYDROCHLOROTHIAZIDE 25 MG PO TABS: 25.0000 mg | ORAL_TABLET | Freq: Every day | ORAL | 1 refills | Status: DC

## 2019-11-18 MED ORDER — METOPROLOL TARTRATE 25 MG PO TABS: 25.0000 mg | ORAL_TABLET | Freq: Two times a day (BID) | ORAL | 1 refills | Status: DC

## 2019-11-18 MED FILL — METOPROLOL TARTRATE 25 MG T: 25 | 90 days supply | Qty: 180 | Fill #0

## 2019-11-18 MED FILL — AMLODIPINE BESYLATE 10 MG T: 10 | 90 days supply | Qty: 90 | Fill #0

## 2019-11-18 MED FILL — HYDROCHLOROTHIAZIDE 25 MG T: 25 | 90 days supply | Qty: 90 | Fill #0

## 2019-11-18 MED FILL — GABAPENTIN 300 MG CAPSULE: 300 | 90 days supply | Qty: 270 | Fill #0

## 2019-11-18 NOTE — Patient Instructions (Signed)
 Gout  Gout is painful swelling of your joints. Gout is a type of arthritis. It is caused by having too much uric acid in your body. Uric acid is a chemical that is made when your body breaks down substances called purines. If your body has too much uric acid, sharp crystals can form and build up in your joints. This causes pain and swelling. Gout attacks can happen quickly and be very painful (acute gout). Over time, the attacks can affect more joints and happen more often (chronic gout). What are the causes?  Too much uric acid in your blood. This can happen because: ? Your kidneys do not remove enough uric acid from your blood. ? Your body makes too much uric acid. ? You eat too many foods that are high in purines. These foods include organ meats, some seafood, and beer.  Trauma or stress. What increases the risk?  Having a family history of gout.  Being male and middle-aged.  Being male and having gone through menopause.  Being very overweight (obese).  Drinking alcohol, especially beer.  Not having enough water in the body (being dehydrated).  Losing weight too quickly.  Having an organ transplant.  Having lead poisoning.  Taking certain medicines.  Having kidney disease.  Having a skin condition called psoriasis. What are the signs or symptoms? An attack of acute gout usually happens in just one joint. The most common place is the big toe. Attacks often start at night. Other joints that may be affected include joints of the feet, ankle, knee, fingers, wrist, or elbow. Symptoms of an attack may include:  Very bad pain.  Warmth.  Swelling.  Stiffness.  Shiny, red, or purple skin.  Tenderness. The affected joint may be very painful to touch.  Chills and fever. Chronic gout may cause symptoms more often. More joints may be involved. You may also have white or yellow lumps (tophi) on your hands or feet or in other areas near your joints. How is this  treated?  Treatment for this condition has two phases: treating an acute attack and preventing future attacks.  Acute gout treatment may include: ? NSAIDs. ? Steroids. These are taken by mouth or injected into a joint. ? Colchicine. This medicine relieves pain and swelling. It can be given by mouth or through an IV tube.  Preventive treatment may include: ? Taking small doses of NSAIDs or colchicine daily. ? Using a medicine that reduces uric acid levels in your blood. ? Making changes to your diet. You may need to see a food expert (dietitian) about what to eat and drink to prevent gout. Follow these instructions at home: During a gout attack   If told, put ice on the painful area: ? Put ice in a plastic bag. ? Place a towel between your skin and the bag. ? Leave the ice on for 20 minutes, 2-3 times a day.  Raise (elevate) the painful joint above the level of your heart as often as you can.  Rest the joint as much as possible. If the joint is in your leg, you may be given crutches.  Follow instructions from your doctor about what you cannot eat or drink. Avoiding future gout attacks  Eat a low-purine diet. Avoid foods and drinks such as: ? Liver. ? Kidney. ? Anchovies. ? Asparagus. ? Herring. ? Mushrooms. ? Mussels. ? Beer.  Stay at a healthy weight. If you want to lose weight, talk with your doctor. Do not lose   weight too fast.  Start or continue an exercise plan as told by your doctor. Eating and drinking  Drink enough fluids to keep your pee (urine) pale yellow.  If you drink alcohol: ? Limit how much you use to:  0-1 drink a day for women.  0-2 drinks a day for men. ? Be aware of how much alcohol is in your drink. In the [place] one drink equals one 12 oz bottle of beer (355 mL), one 5 oz glass of wine (148 mL), or one 1 oz glass of hard liquor (44 mL). General instructions  Take over-the-counter and prescription medicines only as told by your doctor.  Do  not drive or use heavy machinery while taking prescription pain medicine.  Return to your normal activities as told by your doctor. Ask your doctor what activities are safe for you.  Keep all follow-up visits as told by your doctor. This is important. Contact a doctor if:  You have another gout attack.  You still have symptoms of a gout attack after 10 days of treatment.  You have problems (side effects) because of your medicines.  You have chills or a fever.  You have burning pain when you pee (urinate).  You have pain in your lower back or belly. Get help right away if:  You have very bad pain.  Your pain cannot be controlled.  You cannot pee. Summary  Gout is painful swelling of the joints.  The most common site of pain is the big toe, but it can affect other joints.  Medicines and avoiding some foods can help to prevent and treat gout attacks. This information is not intended to replace advice given to you by your health care provider. Make sure you discuss any questions you have with your health care provider. Document Revised: 09/25/2017 Document Reviewed: 09/25/2017 Elsevier Patient Education  2020 [employer] Inc[employer]  

## 2019-11-18 NOTE — Progress Notes (Signed)
Established Patient Office Visit  Subjective:  Patient ID: Robert Carney, male    DOB: 1965/10/08  Age: 54 y.o. MRN: [id number]  CC:  Chief Complaint  Patient presents with  . Hypertension    HPI Robert Carney is a 54 year male old who presents for  hypertension. Denies shortness of breath, headaches, chest pain or lower extremity edema management and requesting medication for the gout. Note he ate a hot dog before appointment. Requesting medication refills  Past Medical History:  Diagnosis Date  . Cervical myelopathy ([place]) 11/01/2016  . Gait abnormality 11/01/2016  . Hypertension    no longer taking meds    Past Surgical History:  Procedure Laterality Date  . ANTERIOR CERVICAL CORPECTOMY N/A 03/26/2016   Procedure: Cervical  Seven Anterior cervical corpectomy;  Surgeon: Kevan Ny Ditty, MD;  Location: [facility];  Service: Neurosurgery;  Laterality: N/A;  Cervical  Seven Anterior cervical corpectomy  . tooth pulled      Family History  Problem Relation Age of Onset  . Diabetes Mother   . Lung cancer Mother   . Hypertension Mother   . Prostate cancer Father   . Hypertension Father   . Breast cancer Sister   . Breast cancer Maternal Aunt     Social History   Socioeconomic History  . Marital status: Single    Spouse name: Not on file  . Number of children: 4  . Years of education: 14  . Highest education level: Not on file  Occupational History  . Occupation: [employer]   Tobacco Use  . Smoking status: Current Some Day Smoker    Packs/day: 0.20    Types: Cigarettes  . Smokeless tobacco: Never Used  Vaping Use  . Vaping Use: Never used  Substance and Sexual Activity  . Alcohol use: Yes    Alcohol/week: 42.0 standard drinks    Types: 42 Cans of beer per week    Comment: Daily beer drinker  . Drug use: Yes    Types: Marijuana    Comment: No history of IV drug use.  Marland Kitchen Sexual activity: Not on file  Other Topics Concern  . Not on file   Social History Narrative   Lives with mother, [name]   Caffeine use: sometimes   Right handed   Social Determinants of Health   Financial Resource Strain:   . Difficulty of Paying Living Expenses: Not on file  Food Insecurity:   . Worried About Charity fundraiser in the Last Year: Not on file  . Ran Out of Food in the Last Year: Not on file  Transportation Needs:   . Lack of Transportation (Medical): Not on file  . Lack of Transportation (Non-Medical): Not on file  Physical Activity:   . Days of Exercise per Week: Not on file  . Minutes of Exercise per Session: Not on file  Stress:   . Feeling of Stress : Not on file  Social Connections:   . Frequency of Communication with Friends and Family: Not on file  . Frequency of Social Gatherings with Friends and Family: Not on file  . Attends Religious Services: Not on file  . Active Member of Clubs or Organizations: Not on file  . Attends Archivist Meetings: Not on file  . Marital Status: Not on file  Intimate Partner Violence:   . Fear of Current or Ex-Partner: Not on file  . Emotionally Abused: Not on file  . Physically Abused:  Not on file  . Sexually Abused: Not on file    Outpatient Medications Prior to Visit  Medication Sig Dispense Refill  . amLODipine (NORVASC) 10 MG tablet Take 1 tablet (10 mg total) by mouth daily. 90 tablet 0  . gabapentin (NEURONTIN) 300 MG capsule Take 1 capsule (300 mg total) by mouth 3 (three) times daily. 90 capsule 5  . hydrochlorothiazide (HYDRODIURIL) 25 MG tablet Take 1 tablet (25 mg total) by mouth daily. 30 tablet 3  . metoprolol tartrate (LOPRESSOR) 25 MG tablet Take 1 tablet (25 mg total) by mouth 2 (two) times daily. 60 tablet 5  . vitamin B-12 (CYANOCOBALAMIN) 100 MCG tablet Take 100 mcg by mouth daily.     No facility-administered medications prior to visit.    Allergies  Allergen Reactions  . No Known Allergies     ROS Review of Systems  Musculoskeletal:       Left  knee and foot swollen- unable to walk using a cane for stability   All other systems reviewed and are negative.     Objective:    Physical Exam Vitals reviewed.  Constitutional:      Appearance: He is normal weight.  HENT:     Head: Normocephalic.     Right Ear: There is impacted cerumen.     Left Ear: Tympanic membrane normal.     Nose: Nose normal.  Eyes:     Extraocular Movements: Extraocular movements intact.     Pupils: Pupils are equal, round, and reactive to light.  Cardiovascular:     Rate and Rhythm: Normal rate and regular rhythm.     Pulses: Normal pulses.  Pulmonary:     Effort: Pulmonary effort is normal.     Breath sounds: Normal breath sounds.  Abdominal:     General: Abdomen is flat. Bowel sounds are normal.  Musculoskeletal:        General: Normal range of motion.     Cervical back: Normal range of motion.     Left lower leg: Edema present.  Neurological:     Mental Status: He is alert and oriented to person, place, and time.  Psychiatric:        Mood and Affect: Mood normal.        Behavior: Behavior normal.        Thought Content: Thought content normal.        Judgment: Judgment normal.     BP 128/86 (BP Location: Right Arm, Patient Position: Sitting, Cuff Size: Normal)   Pulse 70   Temp 98.6 F (37 C) (Oral)   Ht 6' (1.829 m)   Wt 149 lb 3.2 oz (67.7 kg)   SpO2 100%   BMI 20.24 kg/m  Wt Readings from Last 3 Encounters:  11/18/19 149 lb 3.2 oz (67.7 kg)  11/18/18 152 lb 12.8 oz (69.3 kg)  10/28/18 154 lb (69.9 kg)     Health Maintenance Due  Topic Date Due  . COVID-19 Vaccine (1) Never done  . COLONOSCOPY  Never done  . COLON CANCER SCREENING ANNUAL FOBT  10/29/2019    There are no preventive care reminders to display for this patient.  Lab Results  Component Value Date   TSH 6.980 (H) 11/18/2018   Lab Results  Component Value Date   WBC 5.6 11/18/2018   HGB 14.5 11/18/2018   HCT 43.6 11/18/2018   MCV 89 11/18/2018   PLT  304 11/18/2018   Lab Results  Component Value Date  NA 137 11/18/2018   K 4.8 11/18/2018   CHLORIDE 99 10/02/2016   CO2 25 11/18/2018   GLUCOSE 104 (H) 11/18/2018   BUN 15 11/18/2018   CREATININE 1.19 11/18/2018   BILITOT 0.5 11/18/2018   ALKPHOS 53 11/18/2018   AST 20 11/18/2018   ALT 9 11/18/2018   PROT 8.2 11/18/2018   ALBUMIN 4.8 11/18/2018   CALCIUM 10.1 11/18/2018   ANIONGAP 12 10/16/2018   EGFR 84 (L) 10/02/2016   Lab Results  Component Value Date   CHOL 205 (H) 11/18/2018   Lab Results  Component Value Date   HDL 56 11/18/2018   Lab Results  Component Value Date   LDLCALC 119 (H) 11/18/2018   Lab Results  Component Value Date   TRIG 173 (H) 11/18/2018   Lab Results  Component Value Date   CHOLHDL 3.7 11/18/2018   No results found for: HGBA1C    Assessment & Plan:  Robert Carney was seen today for hypertension.  Diagnoses and all orders for this visit:  Essential hypertension Blood pressure is at  goal of less than 130/80, low-sodium, DASH diet, medication compliance, 150 minutes of moderate intensity exercise per week. Discussed medication compliance,   Gait abnormality Uses cane for stability with painful foot   Other secondary gout, multiple sites, unspecified chronicity Patient enjoys red meats-he is a.risk factors greater than 40, male gender, increase in purines red meats and seafoods, alcohol smoking, and obesity greater than 30 -     Uric Acid  Neuropathy  Burning, stabbing, or aching pain in the legs or feet are common symptoms.This can lead to calluses or sores on areas of constant pressure, reduced ability to feel temperature changes.    Medication refill -     amLODipine (NORVASC) 10 MG tablet; Take 1 tablet (10 mg total) by mouth daily. -     metoprolol tartrate (LOPRESSOR) 25 MG tablet; Take 1 tablet (25 mg total) by mouth 2 (two) times daily. -     hydrochlorothiazide (HYDRODIURIL) 25 MG tablet; Take 1 tablet (25 mg total) by mouth  daily. gabapentin (NEURONTIN) 300 MG capsule; Take 1 capsule (300 mg total) by mouth 3 (three) times daily.   Meds ordered this encounter  Medications  . amLODipine (NORVASC) 10 MG tablet    Sig: Take 1 tablet (10 mg total) by mouth daily.    Dispense:  90 tablet    Refill:  1  . metoprolol tartrate (LOPRESSOR) 25 MG tablet    Sig: Take 1 tablet (25 mg total) by mouth 2 (two) times daily.    Dispense:  180 tablet    Refill:  1  . hydrochlorothiazide (HYDRODIURIL) 25 MG tablet    Sig: Take 1 tablet (25 mg total) by mouth daily.    Dispense:  90 tablet    Refill:  1  . gabapentin (NEURONTIN) 300 MG capsule    Sig: Take 1 capsule (300 mg total) by mouth 3 (three) times daily.    Dispense:  270 capsule    Refill:  5    Follow-up: Return in about 6 months (around 05/17/2020) for fasting labs (ASAP)  with right ear irragation /HTN/Gout , neuropathy .    Kerin Perna, NP

## 2019-11-24 ENCOUNTER — Other Ambulatory Visit (INDEPENDENT_AMBULATORY_CARE_PROVIDER_SITE_OTHER): Payer: Medicaid Other

## 2019-11-24 ENCOUNTER — Other Ambulatory Visit: Payer: Self-pay

## 2019-11-24 DIAGNOSIS — G629 Polyneuropathy, unspecified: Secondary | ICD-10-CM

## 2019-11-24 DIAGNOSIS — F17200 Nicotine dependence, unspecified, uncomplicated: Secondary | ICD-10-CM

## 2019-11-24 DIAGNOSIS — Z125 Encounter for screening for malignant neoplasm of prostate: Secondary | ICD-10-CM

## 2019-11-24 DIAGNOSIS — Z13220 Encounter for screening for lipoid disorders: Secondary | ICD-10-CM

## 2019-11-24 DIAGNOSIS — M1049 Other secondary gout, multiple sites: Secondary | ICD-10-CM

## 2019-11-25 LAB — LIPID PANEL
Chol/HDL Ratio: 3.5 ratio (ref 0.0–5.0)
Cholesterol, Total: 166 mg/dL (ref 100–199)
HDL: 48 mg/dL (ref >39)
LDL Chol Calc (NIH): 101 mg/dL — ABNORMAL HIGH (ref 0–99)
Triglycerides: 94 mg/dL (ref 0–149)
VLDL Cholesterol Cal: 17 mg/dL (ref 5–40)

## 2019-11-25 LAB — COMPREHENSIVE METABOLIC PANEL
ALT: 8 IU/L (ref 0–44)
AST: 17 IU/L (ref 0–40)
Albumin/Globulin Ratio: 1.1 — ABNORMAL LOW (ref 1.2–2.2)
Albumin: 3.9 g/dL (ref 3.8–4.9)
Alkaline Phosphatase: 81 IU/L (ref 48–121)
BUN/Creatinine Ratio: 6 — ABNORMAL LOW (ref 9–20)
BUN: 6 mg/dL (ref 6–24)
Bilirubin Total: 0.2 mg/dL (ref 0.0–1.2)
CO2: 25 mmol/L (ref 20–29)
Calcium: 9.8 mg/dL (ref 8.7–10.2)
Chloride: 95 mmol/L — ABNORMAL LOW (ref 96–106)
Creatinine, Ser: 1.04 mg/dL (ref 0.76–1.27)
GFR calc Af Amer: 93 mL/min/{1.73_m2} (ref >59)
GFR calc non Af Amer: 80 mL/min/{1.73_m2} (ref >59)
Globulin, Total: 3.6 g/dL (ref 1.5–4.5)
Glucose: 160 mg/dL — ABNORMAL HIGH (ref 65–99)
Potassium: 4.6 mmol/L (ref 3.5–5.2)
Sodium: 135 mmol/L (ref 134–144)
Total Protein: 7.5 g/dL (ref 6.0–8.5)

## 2019-11-25 LAB — PSA: Prostate Specific Ag, Serum: 0.2 ng/mL (ref 0.0–4.0)

## 2019-11-25 LAB — URIC ACID: Uric Acid: 9.6 mg/dL — ABNORMAL HIGH (ref 3.8–8.4)

## 2019-11-26 ENCOUNTER — Telehealth (INDEPENDENT_AMBULATORY_CARE_PROVIDER_SITE_OTHER): Payer: Self-pay

## 2019-11-26 NOTE — Telephone Encounter (Signed)
Spoke with patient. After verifying date of birth he was informed that his swollen foot was due to gout flare up. Per PCP she and patient spoke about the foods that he likes to consume are what increase his uric acid levels thus causing gout flare ups. Advised patient to view do not eat list to decrease flare ups. All other labs normal. He verbalized understanding. Maryjean Morn, CMA

## 2019-11-26 NOTE — Telephone Encounter (Signed)
-----   Message from [name], NP sent at 11/25/2019  9:24 PM EDT ----- Swollen foot was from gout attack discussed all the food he loves to eat has increase in uric acid refer to the do not eat list to decrease attacks. Other labs normal

## 2020-02-04 ENCOUNTER — Ambulatory Visit (INDEPENDENT_AMBULATORY_CARE_PROVIDER_SITE_OTHER): Payer: Self-pay | Admitting: *Deleted

## 2020-02-04 NOTE — Telephone Encounter (Signed)
Patient was raking and tripped- fell on side on the blower. Patient reports he is having pain in his R ribs- hard to get comfortable at night. Call to office- no appointment open- advised UC for evaluation.  Reason for Disposition . [1] After 72 hours AND [2] chest pain not improving  Answer Assessment - Initial Assessment Questions 1. MECHANISM: "How did the injury happen?"     Fell in yard 2. ONSET: "When did the injury happen?" (e.g., minutes, hours, days ago)     2 days ago 3. LOCATION: "Where on the chest is the injury located?" "Where does it hurt?"     R ribs- left arm sore 4. CHEST OR RIB PAIN SEVERITY: "How bad is the pain?"  (e.g., Scale 1-10; mild, moderate, or severe)    - MILD (1-3): doesn't interfere with normal activities     - MODERATE (4-7): interferes with normal activities or awakens from sleep    - SEVERE (8-10): excruciating pain, unable to do any normal activities       moderate 5. BREATHING DIFFICULTY: "Are you having any difficulty breathing?" If Yes, ask: "How bad is it?"  (e.g., none, mild, moderate, severe)  No breathing problems- cough causes pain 6: OTHER SYMPTOMS (e.g., cough, fever, rash)      no 7. PREGNANCY: "Is there any chance you are pregnant?" "When was your last menstrual period?"     n/a  Protocols used: CHEST INJURY-A-AH, CHEST INJURY - BENDING, LIFTING, OR TWISTING-A-AH

## 2020-02-05 ENCOUNTER — Ambulatory Visit (HOSPITAL_COMMUNITY)
Admission: EM | Admit: 2020-02-05 | Discharge: 2020-02-05 | Disposition: A | Discharge status: Home / Self Care | Payer: Medicaid Other | Attending: Family Medicine | Admitting: Family Medicine

## 2020-02-05 ENCOUNTER — Other Ambulatory Visit (HOSPITAL_COMMUNITY): Payer: Self-pay | Admitting: Family Medicine

## 2020-02-05 ENCOUNTER — Encounter (HOSPITAL_COMMUNITY): Payer: Self-pay

## 2020-02-05 ENCOUNTER — Other Ambulatory Visit: Payer: Self-pay

## 2020-02-05 DIAGNOSIS — S20211A Contusion of right front wall of thorax, initial encounter: Secondary | ICD-10-CM

## 2020-02-05 MED ORDER — CYCLOBENZAPRINE HCL 10 MG PO TABS: 10.0000 mg | ORAL_TABLET | Freq: Three times a day (TID) | ORAL | 0 refills | Status: DC | PRN

## 2020-02-05 MED ORDER — LIDOCAINE 5 % EX PTCH: 1.0000 | MEDICATED_PATCH | CUTANEOUS | 0 refills | Status: DC

## 2020-02-05 MED FILL — CYCLOBENZAPRINE 10 MG TAB: 10 | 30 days supply | Qty: 30 | Fill #0

## 2020-02-05 NOTE — ED Triage Notes (Signed)
Pt presents with right-sided back pain and left ram pain x 2 days. States he over the leaves blower 2 days ago. Ibuprofen gives some relief.

## 2020-02-06 NOTE — ED Provider Notes (Signed)
[facility]    CSN: [id number] Arrival date & time: 02/05/20  0854      History   Chief Complaint Chief Complaint  Patient presents with   Fall   Back Pain    HPI Robert Carney is a 54 y.o. male.   Here today with right sided lateral rib pain after falling on top of his leaf blower 2 days ago. Denies difficulty breathing, wheezing, bruising, swelling. So far taking OTC pain relievers without much relief. No other apparent injuries from the incident.      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 Authorizing Provider  amLODipine (NORVASC) 10 MG tablet Take 1 tablet (10 mg total) by mouth daily. 11/18/19   [name], NP  cyclobenzaprine (FLEXERIL) 10 MG tablet Take 1 tablet (10 mg total) by mouth 3 (three) times daily as needed for muscle spasms. DO NOT DRINK ALCOHOL OR DRIVE WHILE  TAKING THIS MEDICATION 02/05/20   Particia Nearing, PA-C  gabapentin (NEURONTIN) 300 MG capsule Take 1 capsule (300 mg total) by mouth 3 (three) times daily. 11/18/19   [name], NP  hydrochlorothiazide (HYDRODIURIL) 25 MG tablet Take 1 tablet (25 mg total) by mouth daily. 11/18/19   [name], NP  lidocaine (LIDODERM) 5 % Place 1 patch onto the skin daily. Remove & Discard patch within 12 hours or as directed by MD 02/05/20   Particia Nearing, PA-C  metoprolol tartrate (LOPRESSOR) 25 MG tablet Take 1 tablet (25 mg total) by mouth 2 (two) times daily. 11/18/19   [name], NP    Family History Family History  Problem Relation Age of Onset   Diabetes Mother    Lung cancer Mother    Hypertension Mother    Prostate cancer Father    Hypertension Father    Breast cancer Sister    Breast cancer Maternal Aunt     Social History Social History   Tobacco Use   Smoking status: Current Some Day Smoker    Packs/day: 0.20    Types: Cigarettes   Smokeless tobacco: Never Used  Vaping Use   Vaping Use: Never used  Substance Use Topics   Alcohol use: Yes    Alcohol/week: 42.0 standard drinks    Types: 42 Cans of beer per week    Comment: Daily beer drinker   Drug use: Yes    Types: Marijuana  Comment: No history of IV drug use.     Allergies   No known allergies   Review of Systems Review of Systems PER HPI   Physical Exam Triage Vital Signs ED Triage Vitals  Enc Vitals Group     BP 02/05/20 1015 (!) 144/89     Pulse Rate 02/05/20 1015 65     Resp 02/05/20 1015 19     Temp 02/05/20 1015 98 F (36.7 C)     Temp Source 02/05/20 1015 Oral     SpO2 02/05/20 1015 100 %     Weight --      Height --      Head Circumference --      Peak Flow --      Pain Score 02/05/20 1014 7     Pain Loc --      Pain Edu? --      Excl. in GC? --    No data found.  Updated Vital Signs BP (!) 144/89 (BP Location: Right Arm)    Pulse 65     Temp 98 F (36.7 C) (Oral)    Resp 19    SpO2 100%   Visual Acuity Right Eye Distance:   Left Eye Distance:   Bilateral Distance:    Right Eye Near:   Left Eye Near:    Bilateral Near:     Physical Exam Vitals and nursing note reviewed.  Constitutional:      Appearance: Normal appearance.  HENT:     Head: Atraumatic.  Eyes:     Extraocular Movements: Extraocular movements intact.     Conjunctiva/sclera: Conjunctivae normal.  Cardiovascular:     Rate and Rhythm: Normal rate and regular rhythm.  Pulmonary:     Effort: Pulmonary effort is normal.     Breath sounds: Normal breath sounds. No wheezing or rales.  Abdominal:     General: Bowel sounds are normal. There is no distension.     Palpations: Abdomen is soft.     Tenderness: There is no abdominal tenderness. There is no guarding.  Musculoskeletal:        General: Tenderness (generalized ttp right lateral ribs diffusely across site of impact during fall) present. No swelling or deformity. Normal range of motion.     Cervical back: Normal range of motion and neck supple.  Skin:    General: Skin is warm and dry.  Neurological:     General: No focal deficit present.     Mental Status: He is oriented to person, place, and time.     Motor: No weakness.     Gait: Gait normal.  Psychiatric:        Mood and Affect: Mood normal.        Thought Content: Thought content normal.        Judgment: Judgment normal.     UC Treatments / Results  Labs (all labs ordered are listed, but only abnormal results are displayed) Labs Reviewed - No data to display  EKG   Radiology No results found.  Procedures Procedures (including critical care time)  Medications Ordered in UC Medications - No data to display  Initial Impression / Assessment and Plan / UC Course  I have reviewed the triage vital signs and the nursing notes.  Pertinent labs & imaging results that were available during my care of the patient were reviewed by  me and considered in my medical decision making (see chart for details).     Vitals stable, lungs  CTAB with symmetric chest rise and fall, no deformities on exam or point tenderness. He declines rib x-ray today, agreeable to flexeril, lidocaine patches, and continued OTC pain relievers. Discussed expectations for healing timeframe, rest, heat. Return if sxs worsening or not resolving.   Final Clinical Impressions(s) / UC Diagnoses   Final diagnoses:  Contusion of rib on right side, initial encounter   Discharge Instructions   None    ED Prescriptions    Medication Sig Dispense Auth. Provider   cyclobenzaprine (FLEXERIL) 10 MG tablet Take 1 tablet (10 mg total) by mouth 3 (three) times daily as needed for muscle spasms. DO NOT DRINK ALCOHOL OR DRIVE WHILE TAKING THIS MEDICATION 30 tablet Particia Nearing, PA-C   lidocaine (LIDODERM) 5 % Place 1 patch onto the skin daily. Remove & Discard patch within 12 hours or as directed by MD 30 patch Particia Nearing, PA-C     PDMP not reviewed this encounter.   Particia Nearing, [place] 02/06/20 1027

## 2020-02-19 MED FILL — AMLODIPINE BESYLATE 10 MG T: 10 | 90 days supply | Qty: 90 | Fill #1

## 2020-02-19 MED FILL — HYDROCHLOROTHIAZIDE 25 MG T: 25 | 90 days supply | Qty: 90 | Fill #1

## 2020-02-19 MED FILL — GABAPENTIN 300 MG CAPSULE: 300 | 90 days supply | Qty: 270 | Fill #1

## 2020-02-19 MED FILL — METOPROLOL TARTRATE 25 MG T: 25 | 90 days supply | Qty: 180 | Fill #1

## 2020-05-17 ENCOUNTER — Encounter (INDEPENDENT_AMBULATORY_CARE_PROVIDER_SITE_OTHER): Payer: Self-pay | Admitting: Primary Care

## 2020-05-17 ENCOUNTER — Other Ambulatory Visit (INDEPENDENT_AMBULATORY_CARE_PROVIDER_SITE_OTHER): Payer: Self-pay | Admitting: Primary Care

## 2020-05-17 ENCOUNTER — Other Ambulatory Visit: Payer: Self-pay

## 2020-05-17 ENCOUNTER — Ambulatory Visit (INDEPENDENT_AMBULATORY_CARE_PROVIDER_SITE_OTHER): Payer: Medicaid Other | Admitting: Primary Care

## 2020-05-17 VITALS — BP 129/85 | HR 59 | Temp 97.9°F | Ht 72.0 in | Wt 152.0 lb

## 2020-05-17 DIAGNOSIS — I10 Essential (primary) hypertension: Secondary | ICD-10-CM

## 2020-05-17 DIAGNOSIS — R7309 Other abnormal glucose: Secondary | ICD-10-CM

## 2020-05-17 DIAGNOSIS — H6123 Impacted cerumen, bilateral: Secondary | ICD-10-CM | POA: Diagnosis not present

## 2020-05-17 DIAGNOSIS — Z760 Encounter for issue of repeat prescription: Secondary | ICD-10-CM | POA: Diagnosis not present

## 2020-05-17 DIAGNOSIS — H6121 Impacted cerumen, right ear: Secondary | ICD-10-CM

## 2020-05-17 DIAGNOSIS — Z1211 Encounter for screening for malignant neoplasm of colon: Secondary | ICD-10-CM | POA: Diagnosis not present

## 2020-05-17 DIAGNOSIS — Z23 Encounter for immunization: Secondary | ICD-10-CM

## 2020-05-17 LAB — POCT GLYCOSYLATED HEMOGLOBIN (HGB A1C): Hemoglobin A1C: 5.2 % (ref 4.0–5.6)

## 2020-05-17 MED ORDER — HYDROCHLOROTHIAZIDE 25 MG PO TABS: 25.0000 mg | ORAL_TABLET | Freq: Every day | ORAL | 1 refills | Status: DC

## 2020-05-17 MED ORDER — AMLODIPINE BESYLATE 10 MG PO TABS: 10.0000 mg | ORAL_TABLET | Freq: Every day | ORAL | 1 refills | Status: DC

## 2020-05-17 MED ORDER — METOPROLOL TARTRATE 25 MG PO TABS: 25.0000 mg | ORAL_TABLET | Freq: Two times a day (BID) | ORAL | 1 refills | Status: DC

## 2020-05-17 MED FILL — AMLODIPINE BESYLATE 10 MG T: 10 | 90 days supply | Qty: 90 | Fill #0

## 2020-05-17 MED FILL — METOPROLOL TARTRATE 25 MG T: 25 | 90 days supply | Qty: 180 | Fill #0

## 2020-05-17 MED FILL — HYDROCHLOROTHIAZIDE 25 MG T: 25 | 90 days supply | Qty: 90 | Fill #0

## 2020-05-17 NOTE — Progress Notes (Signed)
Established Patient Office Visit  Subjective:  Patient ID: Robert Carney, male    DOB: August 26, 1965  Age: 55 y.o. MRN: [id number]  CC:  Chief Complaint  Patient presents with  . Hypertension  . Cerumen Impaction         HPI Robert Carney  Is a 55 year old male who presents for Bp follow and removal of cerumen.  HTN- Denies shortness of breath, headaches, chest pain or lower extremity edema  Past Medical History:  Diagnosis Date  . Cervical myelopathy ([place]) 11/01/2016  . Gait abnormality 11/01/2016  . Hypertension    no longer taking meds    Past Surgical History:  Procedure Laterality Date  . ANTERIOR CERVICAL CORPECTOMY N/A 03/26/2016   Procedure: Cervical  Seven Anterior cervical corpectomy;  Surgeon: Kevan Ny Ditty, MD;  Location: [place];  Service: Neurosurgery;  Laterality: N/A;  Cervical  Seven Anterior cervical corpectomy  . tooth pulled      Family History  Problem Relation Age of Onset  . Diabetes Mother   . Lung cancer Mother   . Hypertension Mother   . Prostate cancer Father   . Hypertension Father   . Breast cancer Sister   . Breast cancer Maternal Aunt     Social History   Socioeconomic History  . Marital status: Single    Spouse name: Not on file  . Number of children: 4  . Years of education: 28  . Highest education level: Not on file  Occupational History  . Occupation: [employer]   Tobacco Use  . Smoking status: Current Some Day Smoker    Packs/day: 0.20    Types: Cigarettes  . Smokeless tobacco: Never Used  Vaping Use  . Vaping Use: Never used  Substance and Sexual Activity  . Alcohol use: Yes    Alcohol/week: 42.0 standard drinks    Types: 42 Cans of beer per week    Comment: Daily beer drinker  . Drug use: Yes    Types: Marijuana    Comment: No history of IV drug use.  Marland Kitchen Sexual activity: Not on file  Other Topics Concern  . Not on file  Social History Narrative   Lives with mother, [name]   Caffeine  use: sometimes   Right handed   Social Determinants of Health   Financial Resource Strain: Not on file  Food Insecurity: Not on file  Transportation Needs: Not on file  Physical Activity: Not on file  Stress: Not on file  Social Connections: Not on file  Intimate Partner Violence: Not on file    Outpatient Medications Prior to Visit  Medication Sig Dispense Refill  . gabapentin (NEURONTIN) 300 MG capsule Take 1 capsule (300 mg total) by mouth 3 (three) times daily. 270 capsule 5  . lidocaine (LIDODERM) 5 % Place 1 patch onto the skin daily. Remove & Discard patch within 12 hours or as directed by MD 30 patch 0  . amLODipine (NORVASC) 10 MG tablet Take 1 tablet (10 mg total) by mouth daily. 90 tablet 1  . cyclobenzaprine (FLEXERIL) 10 MG tablet Take 1 tablet (10 mg total) by mouth 3 (three) times daily as needed for muscle spasms. DO NOT DRINK ALCOHOL OR DRIVE WHILE TAKING THIS MEDICATION 30 tablet 0  . hydrochlorothiazide (HYDRODIURIL) 25 MG tablet Take 1 tablet (25 mg total) by mouth daily. 90 tablet 1  . metoprolol tartrate (LOPRESSOR) 25 MG tablet Take 1 tablet (25 mg total) by mouth 2 (  two) times daily. 180 tablet 1   No facility-administered medications prior to visit.    Allergies  Allergen Reactions  . No Known Allergies     ROS Review of Systems Pertinent positive and negative noted in HPI    Objective:    Physical Exam Vitals reviewed.  Constitutional:      Appearance: Normal appearance.  HENT:     Right Ear: Tympanic membrane normal.     Left Ear: Tympanic membrane normal.     Ears:     Comments: After irrigation right ear    Nose: Nose normal.  Cardiovascular:     Rate and Rhythm: Normal rate and regular rhythm.  Pulmonary:     Effort: Pulmonary effort is normal.     Breath sounds: Normal breath sounds.  Abdominal:     General: Abdomen is flat. Bowel sounds are normal.     Palpations: Abdomen is soft.  Musculoskeletal:        General: Normal range of  motion.     Cervical back: Normal range of motion.  Skin:    General: Skin is warm and dry.  Neurological:     Mental Status: He is alert and oriented to person, place, and time.  Psychiatric:        Mood and Affect: Mood normal.        Behavior: Behavior normal.        Thought Content: Thought content normal.        Judgment: Judgment normal.     BP 129/85 (BP Location: Right Arm, Patient Position: Sitting, Cuff Size: Normal)   Pulse (!) 59   Temp 97.9 F (36.6 C) (Temporal)   Ht 6' (1.829 m)   Wt 152 lb (68.9 kg)   SpO2 100%   BMI 20.61 kg/m  Wt Readings from Last 3 Encounters:  05/17/20 152 lb (68.9 kg)  11/18/19 149 lb 3.2 oz (67.7 kg)  11/18/18 152 lb 12.8 oz (69.3 kg)     Health Maintenance Due  Topic Date Due  . COVID-19 Vaccine (1) Never done  . COLONOSCOPY (Pts 45-19yrs Insurance coverage will need to be confirmed)  Never done  . COLON CANCER SCREENING ANNUAL FOBT  10/29/2019    There are no preventive care reminders to display for this patient.  Lab Results  Component Value Date   TSH 6.980 (H) 11/18/2018   Lab Results  Component Value Date   WBC 5.6 11/18/2018   HGB 14.5 11/18/2018   HCT 43.6 11/18/2018   MCV 89 11/18/2018   PLT 304 11/18/2018   Lab Results  Component Value Date   NA 135 11/24/2019   K 4.6 11/24/2019   CHLORIDE 99 10/02/2016   CO2 25 11/24/2019   GLUCOSE 160 (H) 11/24/2019   BUN 6 11/24/2019   CREATININE 1.04 11/24/2019   BILITOT 0.2 11/24/2019   ALKPHOS 81 11/24/2019   AST 17 11/24/2019   ALT 8 11/24/2019   PROT 7.5 11/24/2019   ALBUMIN 3.9 11/24/2019   CALCIUM 9.8 11/24/2019   ANIONGAP 12 10/16/2018   EGFR 84 (L) 10/02/2016   Lab Results  Component Value Date   CHOL 166 11/24/2019   Lab Results  Component Value Date   HDL 48 11/24/2019   Lab Results  Component Value Date   LDLCALC 101 (H) 11/24/2019   Lab Results  Component Value Date   TRIG 94 11/24/2019   Lab Results  Component Value Date   CHOLHDL  3.5 11/24/2019   Lab  Results  Component Value Date   HGBA1C 5.2 05/17/2020      Assessment & Plan:  Taiga was seen today for hypertension and cerumen impaction.  Diagnoses and all orders for this visit:  Bilateral impacted cerumen  The patient had a large amount of cerumen in the external auditory canal(s): bilateral  Ear wax softener was used prior to the lavage.  Ear lavage was performed on bilateral ear(s) by Kerin Perna  Curettage by provider was  performed in addition.  To lavage   There were no complications and following the disimpaction the tympanic membrane was visible.  Charges to be entered in Charge Capture section.  Essential hypertension  -     hydrochlorothiazide (HYDRODIURIL) 25 MG tablet; Take 1 tablet (25 mg total) by mouth daily. -     metoprolol tartrate (LOPRESSOR) 25 MG tablet; Take 1 tablet (25 mg total) by mouth 2 (two) times daily. -     amLODipine (NORVASC) 10 MG tablet; Take 1 tablet (10 mg total) by mouth daily.  Medication refill -     hydrochlorothiazide (HYDRODIURIL) 25 MG tablet; Take 1 tablet (25 mg total) by mouth daily. -     metoprolol tartrate (LOPRESSOR) 25 MG tablet; Take 1 tablet (25 mg total) by mouth 2 (two) times daily. -     amLODipine (NORVASC) 10 MG tablet; Take 1 tablet (10 mg total) by mouth daily.  Colon cancer screening Normal colon cancer screening.  CDC recommends colorectal screening from ages 100-75.  -     Ambulatory referral to Gastroenterology  Elevated glucose -     HgB A1c  Need for immunization against influenza -     Flu Vaccine QUAD 36+ mos IM    Follow-up: Return in about 6 months (around 11/17/2020) for IN PERSON- HTN.    Kerin Perna, NP

## 2020-05-24 MED FILL — METOPROLOL TARTRATE 25 MG T: 25 | 90 days supply | Qty: 180 | Fill #0

## 2020-05-24 MED FILL — GABAPENTIN 300 MG CAPSULE: 300 | 90 days supply | Qty: 270 | Fill #2

## 2020-05-24 MED FILL — HYDROCHLOROTHIAZIDE 25 MG T: 25 | 90 days supply | Qty: 90 | Fill #0

## 2020-05-25 MED FILL — AMLODIPINE BESYLATE 10 MG T: 10 | 90 days supply | Qty: 90 | Fill #0

## 2020-08-30 ENCOUNTER — Other Ambulatory Visit: Payer: Self-pay

## 2020-08-30 MED FILL — Amlodipine Besylate Tab 10 MG (Base Equivalent): ORAL | 90 days supply | Qty: 90 | Fill #0 | Status: AC

## 2020-08-30 MED FILL — Hydrochlorothiazide Tab 25 MG: ORAL | 90 days supply | Qty: 90 | Fill #0 | Status: AC

## 2020-08-30 MED FILL — Gabapentin Cap 300 MG: ORAL | 90 days supply | Qty: 270 | Fill #0 | Status: AC

## 2020-08-30 MED FILL — Metoprolol Tartrate Tab 25 MG: ORAL | 90 days supply | Qty: 180 | Fill #0 | Status: AC

## 2020-11-17 ENCOUNTER — Ambulatory Visit (INDEPENDENT_AMBULATORY_CARE_PROVIDER_SITE_OTHER): Payer: Medicaid Other | Admitting: Primary Care

## 2020-12-06 ENCOUNTER — Other Ambulatory Visit: Payer: Self-pay

## 2020-12-06 ENCOUNTER — Encounter (INDEPENDENT_AMBULATORY_CARE_PROVIDER_SITE_OTHER): Payer: Self-pay | Admitting: Primary Care

## 2020-12-06 ENCOUNTER — Ambulatory Visit (INDEPENDENT_AMBULATORY_CARE_PROVIDER_SITE_OTHER): Payer: Medicaid Other | Admitting: Primary Care

## 2020-12-06 VITALS — BP 122/83 | HR 64 | Temp 97.7°F | Ht 72.0 in | Wt 136.8 lb

## 2020-12-06 DIAGNOSIS — Z23 Encounter for immunization: Secondary | ICD-10-CM | POA: Diagnosis not present

## 2020-12-06 DIAGNOSIS — Z760 Encounter for issue of repeat prescription: Secondary | ICD-10-CM

## 2020-12-06 DIAGNOSIS — E785 Hyperlipidemia, unspecified: Secondary | ICD-10-CM | POA: Diagnosis not present

## 2020-12-06 DIAGNOSIS — I10 Essential (primary) hypertension: Secondary | ICD-10-CM

## 2020-12-06 DIAGNOSIS — G629 Polyneuropathy, unspecified: Secondary | ICD-10-CM | POA: Diagnosis not present

## 2020-12-06 DIAGNOSIS — Z1211 Encounter for screening for malignant neoplasm of colon: Secondary | ICD-10-CM

## 2020-12-06 MED ORDER — GABAPENTIN 300 MG PO CAPS
ORAL_CAPSULE | Freq: Three times a day (TID) | ORAL | 5 refills | Status: DC
  Filled 2020-12-06: qty 270, 90d supply, fill #0
  Filled 2021-03-24: qty 270, 90d supply, fill #1
  Filled 2021-03-24: qty 270, 90d supply, fill #0
  Filled 2021-07-20: qty 270, 90d supply, fill #1
  Filled 2021-11-10: qty 270, 90d supply, fill #2

## 2020-12-06 MED ORDER — HYDROCHLOROTHIAZIDE 25 MG PO TABS
ORAL_TABLET | Freq: Every day | ORAL | 1 refills | Status: DC
  Filled 2020-12-06: qty 90, 90d supply, fill #0
  Filled 2021-03-24: qty 90, 90d supply, fill #1
  Filled 2021-03-24: qty 90, 90d supply, fill #0

## 2020-12-06 MED ORDER — AMLODIPINE BESYLATE 10 MG PO TABS
ORAL_TABLET | Freq: Every day | ORAL | 1 refills | Status: DC
  Filled 2020-12-06: qty 90, 90d supply, fill #0
  Filled 2021-03-24: qty 30, 30d supply, fill #0
  Filled 2021-03-24: qty 90, 90d supply, fill #1

## 2020-12-06 MED ORDER — METOPROLOL TARTRATE 25 MG PO TABS
ORAL_TABLET | Freq: Two times a day (BID) | ORAL | 1 refills | Status: DC
  Filled 2020-12-06 – 2021-03-24 (×2): qty 180, 90d supply, fill #0
  Filled 2021-03-24: qty 180, 90d supply, fill #1

## 2020-12-06 NOTE — Progress Notes (Signed)
Would like letter to excuse from jury duty

## 2020-12-06 NOTE — Patient Instructions (Signed)
High-Protein and High-Calorie Diet Eating high-protein and high-calorie foods can help you to gain weight, heal after an injury, and recover after an illness or surgery. The specific amount of daily protein and calories you need depends on: Your body weight. The reason this diet is recommended for you. Generally, a high-protein, high-calorie diet involves: Eating 250-500 extra calories each day. Making sure that you get enough of your daily calories from protein. Ask your health care provider how many of your calories should come from protein. Talk with a health care provider or a dietitian about how much protein and how many calories you need each day. Follow the diet as directed by your health care provider. What are tips for following this plan? Reading food labels Check the nutrition facts label for calories, grams of fat and protein. Items with more than 4 grams of protein are high-protein foods. Preparing meals Add whole milk, half-and-half, or heavy cream to cereal, pudding, soup, or hot cocoa. Add whole milk to instant breakfast drinks. Add peanut butter to oatmeal or smoothies. Add powdered milk to baked goods, smoothies, or milkshakes. Add powdered milk, cream, or butter to mashed potatoes. Add cheese to cooked vegetables. Make whole-milk yogurt parfaits. Top them with granola, fruit, or nuts. Add cottage cheese to fruit. Add avocado, cheese, or both to sandwiches or salads. Add avocado to smoothies. Add meat, poultry, or seafood to rice, pasta, casseroles, salads, and soups. Use mayonnaise when making egg salad, chicken salad, or tuna salad. Use peanut butter as a dip for fruits and vegetables or as a topping for pretzels, celery, or crackers. Add beans to casseroles, dips, and spreads. Add pureed beans to sauces and soups. Replace calorie-free drinks with calorie-containing drinks, such as milk and fruit juice. Replace water with milk or heavy cream when making foods such as  oatmeal, pudding, or cocoa. Add oil or butter to cooked vegetables and grains. Add cream cheese to sandwiches or as a topping on crackers and bread. Make cream-based pastas and soups. General information Ask your health care provider if you should take a nutritional supplement. Try to eat six small meals each day instead of three large meals. A general goal is to eat every 2 to 3 hours. Eat a balanced diet. In each meal, include one food that is high in protein and one food with fat in it. Keep nutritious snacks available, such as nuts, trail mixes, dried fruit, and yogurt. If you have kidney disease or diabetes, talk with your health care provider about how much protein is safe for you. Too much protein may put extra stress on your kidneys. Drink your calories. Choose high-calorie drinks and have them after your meals. Consider setting a timer to remind you to eat. You will want to eat even if you do not feel very hungry. What high-protein foods should I eat? Vegetables Soybeans. Peas. Grains Quinoa. Bulgur wheat. Buckwheat. Meats and other proteins Beef, pork, and poultry. Fish and seafood. Eggs. Tofu. Textured vegetable protein (TVP). Peanut butter. Nuts and seeds. Dried beans. Protein powders. Hummus. Dairy Whole milk. Whole-milk yogurt. Powdered milk. Cheese. Cottage Cheese. Eggnog. Beverages High-protein supplement drinks. Soy milk. Other foods Protein bars. The items listed above may not be a complete list of foods and beverages you can eat and drink. Contact a dietitian for more information. What high-calorie foods should I eat? Fruits Dried fruit. Fruit leather. Canned fruit in syrup. Fruit juice. Avocado. Vegetables Vegetables cooked in oil or butter. Fried potatoes. Grains Pasta. Quick   breads. Muffins. Pancakes. Ready-to-eat cereal. Meats and other proteins Peanut butter. Nuts and seeds. Dairy Heavy cream. Whipped cream. Cream cheese. Sour cream. Ice cream. Custard.  Pudding. Whole milk dairy products. Beverages Meal-replacement beverages. Nutrition shakes. Fruit juice. Seasonings and condiments Salad dressing. Mayonnaise. Alfredo sauce. Fruit preserves or jelly. Honey. Syrup. Sweets and desserts Cake. Cookies. Pie. Pastries. Candy bars. Chocolate. Fats and oils Butter or margarine. Oil. Gravy. Other foods Meal-replacement bars. The items listed above may not be a complete list of foods and beverages you can eat and drink. Contact a dietitian for more information. Summary A high-protein, high-calorie diet can help you gain weight or heal faster after an injury, illness, or surgery. To increase your protein and calories, add ingredients such as whole milk, peanut butter, cheese, beans, meat, or seafood to meal items. To get enough extra calories each day, include high-calorie foods and beverages at each meal. Adding a high-calorie drink or shake can be an easy way to help you get enough calories each day. Talk with your healthcare provider or dietitian about the best options for you. This information is not intended to replace advice given to you by your health care provider. Make sure you discuss any questions you have with your health care provider. Document Revised: 02/07/2020 Document Reviewed: 02/07/2020 Elsevier Patient Education  2022 [employer]  

## 2020-12-07 LAB — COMPREHENSIVE METABOLIC PANEL
ALT: 17 IU/L (ref 0–44)
AST: 36 IU/L (ref 0–40)
Albumin/Globulin Ratio: 1.1 — ABNORMAL LOW (ref 1.2–2.2)
Albumin: 4.2 g/dL (ref 3.8–4.9)
Alkaline Phosphatase: 102 IU/L (ref 44–121)
BUN/Creatinine Ratio: 4 — ABNORMAL LOW (ref 9–20)
BUN: 4 mg/dL — ABNORMAL LOW (ref 6–24)
Bilirubin Total: 0.3 mg/dL (ref 0.0–1.2)
CO2: 24 mmol/L (ref 20–29)
Calcium: 9.8 mg/dL (ref 8.7–10.2)
Chloride: 93 mmol/L — ABNORMAL LOW (ref 96–106)
Creatinine, Ser: 0.92 mg/dL (ref 0.76–1.27)
Globulin, Total: 3.9 g/dL (ref 1.5–4.5)
Glucose: 120 mg/dL — ABNORMAL HIGH (ref 65–99)
Potassium: 4.1 mmol/L (ref 3.5–5.2)
Sodium: 136 mmol/L (ref 134–144)
Total Protein: 8.1 g/dL (ref 6.0–8.5)
eGFR: 98 mL/min/{1.73_m2} (ref >59)

## 2020-12-07 LAB — CBC WITH DIFFERENTIAL/PLATELET
Basophils Absolute: 0.1 10*3/uL (ref 0.0–0.2)
Basos: 1 %
EOS (ABSOLUTE): 0.1 10*3/uL (ref 0.0–0.4)
Eos: 3 %
Hematocrit: 40.5 % (ref 37.5–51.0)
Hemoglobin: 14 g/dL (ref 13.0–17.7)
Immature Grans (Abs): 0 10*3/uL (ref 0.0–0.1)
Immature Granulocytes: 0 %
Lymphocytes Absolute: 1.4 10*3/uL (ref 0.7–3.1)
Lymphs: 32 %
MCH: 30.1 pg (ref 26.6–33.0)
MCHC: 34.6 g/dL (ref 31.5–35.7)
MCV: 87 fL (ref 79–97)
Monocytes Absolute: 0.5 10*3/uL (ref 0.1–0.9)
Monocytes: 11 %
Neutrophils Absolute: 2.3 10*3/uL (ref 1.4–7.0)
Neutrophils: 53 %
Platelets: 230 10*3/uL (ref 150–450)
RBC: 4.65 x10E6/uL (ref 4.14–5.80)
RDW: 12.9 % (ref 11.6–15.4)
WBC: 4.3 10*3/uL (ref 3.4–10.8)

## 2020-12-07 LAB — LIPID PANEL
Chol/HDL Ratio: 2 ratio (ref 0.0–5.0)
Cholesterol, Total: 179 mg/dL (ref 100–199)
HDL: 90 mg/dL (ref >39)
LDL Chol Calc (NIH): 78 mg/dL (ref 0–99)
Triglycerides: 59 mg/dL (ref 0–149)
VLDL Cholesterol Cal: 11 mg/dL (ref 5–40)

## 2020-12-10 NOTE — Progress Notes (Signed)
Established Patient Office Visit  Subjective:  Patient ID: Robert Carney, male    DOB: 08-09-1965  Age: 55 y.o. MRN: [id number]  CC:  Chief Complaint  Patient presents with   Hypertension   Medication Refill    HPI Robert Carney is 55 year old male presents for management of HTN. Denies shortness of breath, headaches, chest pain or lower extremity edema.  Past Medical History:  Diagnosis Date   Cervical myelopathy ([place]) 11/01/2016   Gait abnormality 11/01/2016   Hypertension    no longer taking meds    Past Surgical History:  Procedure Laterality Date   ANTERIOR CERVICAL CORPECTOMY N/A 03/26/2016   Procedure: Cervical  Seven Anterior cervical corpectomy;  Surgeon: Kevan Ny Ditty, MD;  Location: [facility];  Service: Neurosurgery;  Laterality: N/A;  Cervical  Seven Anterior cervical corpectomy   tooth pulled      Family History  Problem Relation Age of Onset   Diabetes Mother    Lung cancer Mother    Hypertension Mother    Prostate cancer Father    Hypertension Father    Breast cancer Sister    Breast cancer Maternal Aunt     Social History   Socioeconomic History   Marital status: Single    Spouse name: Not on file   Number of children: 4   Years of education: 12   Highest education level: Not on file  Occupational History   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   Sexual activity: Not on file  Other Topics Concern   Not on file  Social History Narrative   Lives with mother, [name]   Caffeine use: sometimes   Right handed   Social Determinants of Health   Financial  Resource Strain: Not on file  Food Insecurity: Not on file  Transportation Needs: Not on file  Physical Activity: Not on file  Stress: Not on file  Social Connections: Not on file  Intimate Partner Violence: Not on file    Outpatient Medications Prior to Visit  Medication Sig Dispense Refill   lidocaine (LIDODERM) 5 % Place 1 patch onto the skin daily. Remove & Discard patch within 12 hours or as directed by MD 30 patch 0   amLODipine (NORVASC) 10 MG tablet TAKE 1 TABLET (10 MG TOTAL) BY MOUTH DAILY. 90 tablet 1   hydrochlorothiazide (HYDRODIURIL) 25 MG tablet TAKE 1 TABLET (25 MG TOTAL) BY MOUTH DAILY. 90 tablet 1   metoprolol tartrate (LOPRESSOR) 25 MG tablet TAKE 1 TABLET (25 MG TOTAL) BY MOUTH 2 (TWO) TIMES DAILY. 180 tablet 1   gabapentin (NEURONTIN) 300 MG capsule TAKE 1 CAPSULE (300 MG TOTAL) BY MOUTH 3 (THREE) TIMES DAILY. 270 capsule 5   No facility-administered medications prior to visit.    Allergies  Allergen Reactions   No Known Allergies     ROS Review of Systems  All other systems reviewed and are negative.    Objective:    Physical Exam Vitals reviewed.  Constitutional:      Appearance: Normal appearance.  HENT:     Head: Normocephalic.     Right Ear: Tympanic membrane and external ear normal.     Left Ear: Tympanic membrane and external ear normal.     Nose: Nose normal.  Eyes:     Extraocular Movements: Extraocular movements intact.     Pupils: Pupils are equal, round, and reactive to light.  Cardiovascular:     Rate and Rhythm: Normal rate and regular rhythm.  Pulmonary:     Effort: Pulmonary effort is normal.     Breath sounds: Normal breath sounds.  Abdominal:     General: Abdomen is flat. Bowel sounds are normal.     Palpations: Abdomen is soft.  Musculoskeletal:        General: Normal range of motion.     Cervical back: Normal range of motion.  Skin:    General: Skin is warm and dry.  Neurological:     Mental Status: He is alert and  oriented to person, place, and time.  Psychiatric:        Mood and Affect: Mood normal.        Behavior: Behavior normal.   BP 122/83 (BP Location: Right Arm, Patient Position: Sitting, Cuff Size: Normal)   Pulse 64   Temp 97.7 F (36.5 C) (Temporal)   Ht 6' (1.829 m)   Wt 136 lb 12.8 oz (62.1 kg)   SpO2 99%   BMI 18.55 kg/m  Wt Readings from Last 3 Encounters:  12/06/20 136 lb 12.8 oz (62.1 kg)  05/17/20 152 lb (68.9 kg)  11/18/19 149 lb 3.2 oz (67.7 kg)     Health Maintenance Due  Topic Date Due   COVID-19 Vaccine (1) Never done   COLONOSCOPY (Pts 45-70yr Insurance coverage will need to be confirmed)  Never done   Zoster Vaccines- Shingrix (1 of 2) Never done   COLON CANCER SCREENING ANNUAL FOBT  10/29/2019    There are no preventive care reminders to display for this patient.  Lab Results  Component Value Date   TSH 6.980 (H) 11/18/2018   Lab Results  Component Value Date   WBC 4.3 12/06/2020   HGB 14.0 12/06/2020   HCT 40.5 12/06/2020   MCV 87 12/06/2020   PLT 230 12/06/2020   Lab Results  Component Value Date   NA 136 12/06/2020   K 4.1 12/06/2020   CHLORIDE 99 10/02/2016   CO2 24 12/06/2020   GLUCOSE 120 (H) 12/06/2020   BUN 4 (L) 12/06/2020   CREATININE 0.92 12/06/2020   BILITOT 0.3 12/06/2020   ALKPHOS 102 12/06/2020   AST 36 12/06/2020   ALT 17 12/06/2020   PROT 8.1 12/06/2020   ALBUMIN 4.2 12/06/2020   CALCIUM 9.8 12/06/2020   ANIONGAP 12 10/16/2018   EGFR 98 12/06/2020   Lab Results  Component Value Date   CHOL 179 12/06/2020   Lab Results  Component Value Date   HDL 90 12/06/2020   Lab Results  Component Value Date   LDLCALC 78 12/06/2020   Lab Results  Component Value Date   TRIG 59 12/06/2020   Lab Results  Component Value Date   CHOLHDL 2.0 12/06/2020   Lab Results  Component Value Date   HGBA1C 5.2 05/17/2020      Assessment & Plan:   EAlexawas seen today for hypertension and medication refill.  Diagnoses  and all orders for this visit:  Need for immunization against influenza -  Flu Vaccine QUAD 29moIM (Fluarix, Fluzone & Alfiuria Quad PF)  Essential hypertension Well controlled BP. Continue to monitor  low-sodium, DASH diet, medication compliance, 150 minutes of moderate intensity exercise per week. Discussed medication compliance, adverse effects.  -     amLODipine (NORVASC) 10 MG tablet; TAKE 1 TABLET (10 MG TOTAL) BY MOUTH DAILY. -     hydrochlorothiazide (HYDRODIURIL) 25 MG tablet; TAKE 1 TABLET (25 MG TOTAL) BY MOUTH DAILY. -     metoprolol tartrate (LOPRESSOR) 25 MG tablet; TAKE 1 TABLET (25 MG TOTAL) BY MOUTH 2 (TWO) TIMES DAILY. -     CBC with Differential/Platelet -     Comprehensive metabolic panel  Medication refill -     amLODipine (NORVASC) 10 MG tablet; TAKE 1 TABLET (10 MG TOTAL) BY MOUTH DAILY. -     hydrochlorothiazide (HYDRODIURIL) 25 MG tablet; TAKE 1 TABLET (25 MG TOTAL) BY MOUTH DAILY. -     metoprolol tartrate (LOPRESSOR) 25 MG tablet; TAKE 1 TABLET (25 MG TOTAL) BY MOUTH 2 (TWO) TIMES DAILY.  Neuropathy -     gabapentin (NEURONTIN) 300 MG capsule; TAKE 1 CAPSULE (300 MG TOTAL) BY MOUTH 3 (THREE) TIMES DAILY.  Colon cancer screening -     Cologuard  Hyperlipidemia, unspecified hyperlipidemia type  Healthy lifestyle diet of fruits vegetables fish nuts whole grains and low saturated fat . Foods high in cholesterol or liver, fatty meats,cheese, butter avocados, nuts and seeds, chocolate and fried foods.    -     Lipid panel   Meds ordered this encounter  Medications   amLODipine (NORVASC) 10 MG tablet    Sig: TAKE 1 TABLET (10 MG TOTAL) BY MOUTH DAILY.    Dispense:  90 tablet    Refill:  1   gabapentin (NEURONTIN) 300 MG capsule    Sig: TAKE 1 CAPSULE (300 MG TOTAL) BY MOUTH 3 (THREE) TIMES DAILY.    Dispense:  270 capsule    Refill:  5   hydrochlorothiazide (HYDRODIURIL) 25 MG tablet    Sig: TAKE 1 TABLET (25 MG TOTAL) BY MOUTH DAILY.    Dispense:  90  tablet    Refill:  1   metoprolol tartrate (LOPRESSOR) 25 MG tablet    Sig: TAKE 1 TABLET (25 MG TOTAL) BY MOUTH 2 (TWO) TIMES DAILY.    Dispense:  180 tablet    Refill:  1    Follow-up: Return for derumen impaction left> rt.    MKerin Perna NP

## 2020-12-14 ENCOUNTER — Encounter (INDEPENDENT_AMBULATORY_CARE_PROVIDER_SITE_OTHER): Payer: Self-pay

## 2020-12-20 ENCOUNTER — Encounter (INDEPENDENT_AMBULATORY_CARE_PROVIDER_SITE_OTHER): Payer: Self-pay | Admitting: Primary Care

## 2020-12-20 ENCOUNTER — Ambulatory Visit (INDEPENDENT_AMBULATORY_CARE_PROVIDER_SITE_OTHER): Payer: Medicaid Other | Admitting: Primary Care

## 2020-12-20 ENCOUNTER — Other Ambulatory Visit: Payer: Self-pay

## 2020-12-20 VITALS — BP 117/77 | HR 55 | Temp 97.7°F | Ht 72.0 in | Wt 140.6 lb

## 2020-12-20 DIAGNOSIS — L84 Corns and callosities: Secondary | ICD-10-CM | POA: Diagnosis not present

## 2020-12-20 DIAGNOSIS — I10 Essential (primary) hypertension: Secondary | ICD-10-CM | POA: Diagnosis not present

## 2020-12-20 DIAGNOSIS — H6123 Impacted cerumen, bilateral: Secondary | ICD-10-CM | POA: Diagnosis not present

## 2020-12-20 DIAGNOSIS — G629 Polyneuropathy, unspecified: Secondary | ICD-10-CM

## 2020-12-20 NOTE — Patient Instructions (Addendum)
How do you treat calluses on your hands? Soaking corns and calluses in warm, soapy water softens them. This can make it easier to remove the thickened skin. Thin thickened skin. Once you've softened the affected skin, rub the corn or callus with a pumice stone, nail file, emery board or washcloth.Recombinant Zoster (Shingles) Vaccine: What You Need to Know 1. Why get vaccinated? Recombinant zoster (shingles) vaccine can prevent shingles. Shingles (also called herpes zoster, or just zoster) is a painful skin rash, usually with blisters. In addition to the rash, shingles can cause fever, headache, chills, or upset stomach. Rarely, shingles can lead to complications such as pneumonia, hearing problems, blindness, brain inflammation (encephalitis), or death. The risk of shingles increases with age. The most common complication of shingles is long-term nerve pain called postherpetic neuralgia (PHN). PHN occurs in the areas where the shingles rash was and can last for months or years after the rash goes away. The pain from PHN can be severe and debilitating. The risk of PHN increases with age. An older adult with shingles is more likely to develop PHN and have longer lasting and more severe pain than a younger person. People with weakened immune systems also have a higher risk of getting shingles and complications from the disease. Shingles is caused by varicella-zoster virus, the same virus that causes chickenpox. After you have chickenpox, the virus stays in your body and can cause shingles later in life. Shingles cannot be passed from one person to another, but the virus that causes shingles can spread and cause chickenpox in someone who has never had chickenpox or has never received chickenpox vaccine. 2. Recombinant shingles vaccine Recombinant shingles vaccine provides strong protection against shingles. By preventing shingles, recombinant shingles vaccine also protects against PHN and other  complications. Recombinant shingles vaccine is recommended for: Adults 50 years and older Adults 19 years and older who have a weakened immune system because of disease or treatments Shingles vaccine is given as a two-dose series. For most people, the second dose should be given 2 to 6 months after the first dose. Some people who have or will have a weakened immune system can get the second dose 1 to 2 months after the first dose. Ask your health care provider for guidance. People who have had shingles in the past and people who have received varicella (chickenpox) vaccine are recommended to get recombinant shingles vaccine. The vaccine is also recommended for people who have already gotten another type of shingles vaccine, the live shingles vaccine. There is no live virus in recombinant shingles vaccine. Shingles vaccine may be given at the same time as other vaccines. 3. Talk with your health care provider Tell your vaccination provider if the person getting the vaccine: Has had an allergic reaction after a previous dose of recombinant shingles vaccine, or has any severe, life-threatening allergies Is currently experiencing an episode of shingles Is pregnant In some cases, your health care provider may decide to postpone shingles vaccination until a future visit. People with minor illnesses, such as a cold, may be vaccinated. People who are moderately or severely ill should usually wait until they recover before getting recombinant shingles vaccine. Your health care provider can give you more information. 4. Risks of a vaccine reaction A sore arm with mild or moderate pain is very common after recombinant shingles vaccine. Redness and swelling can also happen at the site of the injection. Tiredness, muscle pain, headache, shivering, fever, stomach pain, and nausea are common after recombinant  shingles vaccine. These side effects may temporarily prevent a vaccinated person from doing regular  activities. Symptoms usually go away on their own in 2 to 3 days. You should still get the second dose of recombinant shingles vaccine even if you had one of these reactions after the first dose. Guillain-Barr syndrome (GBS), a serious nervous system disorder, has been reported very rarely after recombinant zoster vaccine. People sometimes faint after medical procedures, including vaccination. Tell your provider if you feel dizzy or have vision changes or ringing in the ears. As with any medicine, there is a very remote chance of a vaccine causing a severe allergic reaction, other serious injury, or death. 5. What if there is a serious problem? An allergic reaction could occur after the vaccinated person leaves the clinic. If you see signs of a severe allergic reaction (hives, swelling of the face and throat, difficulty breathing, a fast heartbeat, dizziness, or weakness), call 9-1-1 and get the person to the nearest hospital. For other signs that concern you, call your health care provider. Adverse reactions should be reported to the Vaccine Adverse Event Reporting System (VAERS). Your health care provider will usually file this report, or you can do it yourself. Visit the VAERS website at www.vaers.LAgents.no or call 952-363-9032. VAERS is only for reporting reactions, and VAERS staff members do not give medical advice. 6. How can I learn more? Ask your health care provider. Call your local or state health department. Visit the website of the Food and Drug Administration (FDA) for vaccine package inserts and additional information at GoldCloset.com.ee. Contact the Centers for Disease Control and Prevention (CDC): Call (319) 690-0467 (1-800-CDC-INFO) or Visit CDC's website at PicCapture.uy. Vaccine Information Statement Recombinant Zoster Vaccine (04/22/2020) This information is not intended to replace advice given to you by your health care provider. Make sure you  discuss any questions you have with your health care provider. Document Revised: 05/06/2020 Document Reviewed: 05/06/2020 Elsevier Patient Education  2022 Elsevier Inc. Corns and Calluses Corns are small areas of thickened skin that form on the top, sides, or tip of a toe. Corns have a cone-shaped core with a point that can press on a nerve below. This causes pain. Calluses are areas of thickened skin that can form anywhere on the body, including the hands, fingers, palms, soles of the feet, and heels. Calluses are usually larger than corns. What are the causes? Corns and calluses are caused by rubbing (friction) or pressure, such as from shoes that are too tight or do not fit properly. What increases the risk? Corns are more likely to develop in people who have misshapen toes (toe deformities), such as hammer toes. Calluses can form with friction to any area of the skin. They are more likely to develop in people who: Work with their hands. Wear shoes that fit poorly, are too tight, or are high-heeled. Have toe deformities. What are the signs or symptoms? Symptoms of a corn or callus include: A hard growth on the skin. Pain or tenderness under the skin. Redness and swelling. Increased discomfort while wearing tight-fitting shoes, if your feet are affected. If a corn or callus becomes infected, symptoms may include: Redness and swelling that gets worse. Pain. Fluid, blood, or pus draining from the corn or callus. How is this diagnosed? Corns and calluses may be diagnosed based on your symptoms, your medical history, and a physical exam. How is this treated? Treatment for corns and calluses may include: Removing the cause of the friction or pressure. This  may involve: Changing your shoes. Wearing shoe inserts (orthotics) or other protective layers in your shoes, such as a corn pad. Wearing gloves. Applying medicine to the skin (topical medicine) to help soften skin in the hardened,  thickened areas. Removing layers of dead skin with a file to reduce the size of the corn or callus. Removing the corn or callus with a scalpel or laser. Taking antibiotic medicines, if your corn or callus is infected. Having surgery, if a toe deformity is the cause. Follow these instructions at home:  Take over-the-counter and prescription medicines only as told by your health care provider. If you were prescribed an antibiotic medicine, take it as told by your health care provider. Do not stop taking it even if your condition improves. Wear shoes that fit well. Avoid wearing high-heeled shoes and shoes that are too tight or too loose. Wear any padding, protective layers, gloves, or orthotics as told by your health care provider. Soak your hands or feet. Then use a file or pumice stone to soften your corn or callus. Do this as told by your health care provider. Check your corn or callus every day for signs of infection. Contact a health care provider if: Your symptoms do not improve with treatment. You have redness or swelling that gets worse. Your corn or callus becomes painful. You have fluid, blood, or pus coming from your corn or callus. You have new symptoms. Get help right away if: You develop severe pain with redness. Summary Corns are small areas of thickened skin that form on the top, sides, or tip of a toe. These can be painful. Calluses are areas of thickened skin that can form anywhere on the body, including the hands, fingers, palms, and soles of the feet. Calluses are usually larger than corns. Corns and calluses are caused by rubbing (friction) or pressure, such as from shoes that are too tight or do not fit properly. Treatment may include wearing padding, protective layers, gloves, or orthotics as told by your health care provider. This information is not intended to replace advice given to you by your health care provider. Make sure you discuss any questions you have with  your health care provider. Document Revised: 07/02/2019 Document Reviewed: 07/02/2019 Elsevier Patient Education  2022 [employer].

## 2020-12-20 NOTE — Progress Notes (Signed)
[facility]  [facility]  Subjective:    Robert Carney is a 55 y.o. male whom presented today for ear lavage. . There is not a prior history of cerumen impaction. The patient has not been using ear drops to loosen wax immediately prior to this visit. The patient denies ear pain.  The patient's history has been marked as reviewed and updated as appropriate. allergies, current medications, past family history, past medical history, past social history, and past surgical history Past Medical History:  Diagnosis Date   Cervical myelopathy ([facility]) 11/01/2016   Gait abnormality 11/01/2016   Hypertension    no longer taking meds   Past Surgical History:  Procedure Laterality Date   ANTERIOR CERVICAL CORPECTOMY N/A 03/26/2016   Procedure: Cervical  Seven Anterior cervical corpectomy;  Surgeon: [name], MD;  Location: [facility];  Service: Neurosurgery;  Laterality: N/A;  Cervical  Seven Anterior cervical corpectomy   tooth pulled     Family History  Problem Relation Age of Onset   Diabetes Mother    Lung cancer Mother    Hypertension Mother    Prostate cancer Father    Hypertension Father    Breast cancer Sister    Breast cancer Maternal Aunt    Social History   Socioeconomic History   Marital status: Single    Spouse name: Not on file   Number of children: 4   Years of education: 12   Highest education level: Not on file  Occupational History   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   Sexual activity: Not on file  Other Topics Concern   Not on file   Social History Narrative   Lives with mother, Kathie Rhodes   Caffeine use: sometimes   Right handed   Social Determinants of Health   Financial Resource Strain: Not on file  Food Insecurity: Not on file  Transportation Needs: Not on file  Physical Activity: Not on file  Stress: Not on file  Social Connections: Not on file   Allergies: No known allergies  Review of Systems Constitutional: negative Eyes: negative Ears, nose, mouth, throat, and face: negative Respiratory: negative Cardiovascular: negative Gastrointestinal: negative Musculoskeletal:negative Behavioral/Psych: negative   Skin callous on left hand  Objective:  BP 117/77 (BP Location: Right Arm, Patient Position: Sitting, Cuff Size: Normal)   Pulse (!) 55   Temp 97.7 F (36.5 C) (Temporal)   Ht 6' (1.829 m)   Wt 140 lb 9.6 oz (63.8 kg)   SpO2 100%   BMI 19.07 kg/m    Ndrew was seen today for cerumen impaction.  Diagnoses and all orders for this visit:  Bilateral impacted cerumen  Auditory canal(s) of both ears are completely obstructed with cerumen.  Cerumen was removed using gentle irrigation. Tympanic membranes are intact following the procedure.  Auditory canals are normal.   Cerumen Impaction without otitis externa.    Callus Superior left near fourth digit callus tender and painful when rub on explained  what they were in the palms of his hands and AVS for home tx   Neuropathy Managed with gabapentin 300 mg 3 times daily  Essential hypertension Well controlled followed by cardiology . She follows DASH diet, medication compliance, 150 minutes of moderate intensity exercise per week. Discussed medication compliance, adverse effects.   This note has been created with Education officer, environmental. Any transcriptional errors are unintentional.  [name] [name]

## 2021-02-06 LAB — COLOGUARD: Cologuard: NEGATIVE

## 2021-02-13 ENCOUNTER — Telehealth (INDEPENDENT_AMBULATORY_CARE_PROVIDER_SITE_OTHER): Payer: Self-pay

## 2021-02-13 NOTE — Telephone Encounter (Signed)
-----   Message from [name], NP sent at 02/11/2021 12:20 PM EST ----- COLOGUARD Negative repeat in 3 years

## 2021-02-13 NOTE — Telephone Encounter (Signed)
Patient is aware of negative colon cancer screening. Repeat in 3 years. He verbalized understanding. Maryjean Morn, CMA

## 2021-03-14 ENCOUNTER — Other Ambulatory Visit: Payer: Self-pay

## 2021-03-14 ENCOUNTER — Ambulatory Visit (HOSPITAL_COMMUNITY)
Admission: EM | Admit: 2021-03-14 | Discharge: 2021-03-14 | Disposition: A | Discharge status: Home / Self Care | Payer: Medicaid Other | Attending: Emergency Medicine | Admitting: Emergency Medicine

## 2021-03-14 ENCOUNTER — Encounter (HOSPITAL_COMMUNITY): Payer: Self-pay | Admitting: Emergency Medicine

## 2021-03-14 DIAGNOSIS — K651 Peritoneal abscess: Secondary | ICD-10-CM

## 2021-03-14 MED ORDER — DOXYCYCLINE HYCLATE 100 MG PO TABS
100.0000 mg | ORAL_TABLET | Freq: Two times a day (BID) | ORAL | 0 refills | Status: DC
  Filled 2021-03-14: qty 14, 7d supply, fill #0

## 2021-03-14 NOTE — ED Triage Notes (Signed)
Pt is present today with an abscess located near his groin. Pt states he noticed it began to cause pain this week.

## 2021-03-14 NOTE — Discharge Instructions (Signed)
Take doxycyline twice a day for 7 days.   Hold warm-hot compresses to affected area at least 4 times a day, this helps to facilitate draining, the more the better  Please return for evaluation for increased swelling, increased tenderness or pain, non healing site, non draining site, you begin to have fever or chills   We reviewed the etiology of recurrent abscesses of skin.  Skin abscesses are collections of pus within the dermis and deeper skin tissues. Skin abscesses manifest as painful, tender, fluctuant, and erythematous nodules, frequently surmounted by a pustule and surrounded by a rim of erythematous swelling.  Spontaneous drainage of purulent material may occur.  Fever can occur on occasion.    -Skin abscesses can develop in healthy individuals with no predisposing conditions other than skin or nasal carriage of Staphylococcus aureus.  Individuals in close contact with others who have active infection with skin abscesses are at increased risk which is likely to explain why twin brother has similar episodes.   In addition, any process leading to a breach in the skin barrier can also predispose to the development of a skin abscesses, such as atopic dermatitis.    

## 2021-03-14 NOTE — ED Provider Notes (Signed)
[facility]    CSN: [id number] Arrival date & time: 03/14/21  0847      History   Chief Complaint Chief Complaint  Patient presents with   Abscess    HPI Robert Carney is a 55 y.o. male.   Patient presents with groin abscess present for 1 week.  Endorses that it has become more painful and increased in size.  Denies drainage, fever, chills.  Has not attempted treatment.  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 Authorizing Provider  doxycycline (VIBRAMYCIN) 100 MG capsule Take 1 capsule (100 mg total) by mouth 2 (two) times daily. 03/14/21  Yes Billy Rocco R, NP  amLODipine (NORVASC) 10 MG tablet TAKE 1 TABLET (10 MG TOTAL) BY MOUTH DAILY. 12/06/20 12/06/21  [name], NP  gabapentin (NEURONTIN) 300 MG capsule TAKE 1 CAPSULE (300 MG TOTAL) BY MOUTH 3 (THREE) TIMES DAILY. 12/06/20 12/06/21  [name], NP  hydrochlorothiazide (HYDRODIURIL) 25 MG tablet TAKE 1 TABLET (25 MG TOTAL) BY MOUTH DAILY. 12/06/20 12/06/21  [name], NP  lidocaine (LIDODERM) 5 % Place 1 patch onto the skin daily. Remove & Discard patch within 12 hours or as directed by MD 02/05/20   Particia Nearing, PA-C  metoprolol tartrate (LOPRESSOR) 25 MG tablet TAKE 1 TABLET (25 MG TOTAL) BY MOUTH 2 (TWO) TIMES DAILY. 12/06/20 12/06/21  [name], NP    Family History Family History  Problem Relation Age of Onset   Diabetes Mother    Lung cancer Mother    Hypertension Mother    Prostate cancer Father    Hypertension Father    Breast cancer Sister    Breast cancer Maternal Aunt     Social History Social History   Tobacco Use   Smoking status: Some Days    Packs/day: 0.20    Types: Cigarettes   Smokeless tobacco: Never  Vaping Use   Vaping Use: Never used  Substance Use Topics   Alcohol use: Yes    Alcohol/week: 42.0 standard drinks    Types: 42 Cans of beer per week    Comment: Daily beer drinker   Drug use: Yes    Types: Marijuana    Comment: No history of IV drug use.     Allergies   No known allergies   Review of Systems Review of Systems  Constitutional: Negative.   Respiratory: Negative.  Cardiovascular: Negative.   Genitourinary: Negative.   Neurological: Negative.     Physical Exam Triage Vital Signs ED Triage Vitals  Enc Vitals Group     BP 03/14/21 0924 119/78     Pulse Rate 03/14/21 0924 63     Resp 03/14/21 0924 17     Temp 03/14/21 0924 98.2 F (36.8 C)     Temp Source 03/14/21 0924 Oral     SpO2 03/14/21 0924 94 %     Weight --      Height --      Head Circumference --      Peak Flow --      Pain Score 03/14/21 0923 9     Pain Loc --      Pain Edu? --      Excl. in GC? --    No data found.  Updated Vital Signs BP 119/78    Pulse 63    Temp 98.2 F (36.8 C) (Oral)    Resp 17    SpO2 94%   Visual Acuity Right Eye Distance:    Left Eye Distance:   Bilateral Distance:    Right Eye Near:   Left Eye Near:    Bilateral Near:     Physical Exam Constitutional:      Appearance: Normal appearance. He is normal weight.  HENT:     Head: Normocephalic.  Eyes:     Extraocular Movements: Extraocular movements intact.  Abdominal:       Comments: 2 x 2 centimeter immature abscess present in the center pelvis treated, tender to palpation, nondraining  Neurological:     Mental Status: He is alert and oriented to person, place, and time. Mental status is at baseline.  Psychiatric:        Mood and Affect: Mood normal.        Behavior: Behavior normal.     UC Treatments / Results  Labs (all labs ordered are listed, but only abnormal results are displayed) Labs Reviewed - No data to display  EKG   Radiology No results found.  Procedures Procedures (including critical care time)  Medications Ordered in UC Medications - No data to display  Initial Impression / Assessment and Plan / UC Course  I have reviewed the triage vital signs and the nursing notes.  Pertinent labs & imaging results that were available during my care of the patient were reviewed by me and considered in my medical decision making (see chart for details).  Abscess of male pelvis  For incision and drainage this time, discussed with patient as abscess is not mature, will manage conservatively, Doxycycline 7-day course prescribed, over-the-counter medications for pain management, advised warm compresses to the affected area at least 4 times a day, recommended daily exfoliation moving forward, urgent care follow-up for persistent or nondraining, nonhealing site Final Clinical Impressions(s) / UC Diagnoses   Final diagnoses:  Abscess of male pelvis [facility])     Discharge Instructions      Take doxycyline twice a day  for 7 days   Hold warm-hot compresses to affected area at least 4 times a day, this helps to facilitate draining, the  more the better  Please return for evaluation for increased swelling, increased tenderness or pain, non healing site, non draining site, you begin to have fever or chills   We reviewed the etiology of recurrent abscesses of skin.  Skin abscesses are collections of pus within the dermis and deeper skin tissues. Skin abscesses manifest  as painful, tender, fluctuant, and erythematous nodules, frequently surmounted by a pustule and surrounded by a rim of erythematous swelling.  Spontaneous drainage of purulent material may occur.  Fever can occur on occasion.    -Skin abscesses can develop in healthy individuals with no predisposing conditions other than skin or nasal carriage of Staphylococcus aureus.  Individuals in close contact with others who have active infection with skin abscesses are at increased risk which is likely to explain why twin brother has similar episodes.   In addition, any process leading to a breach in the skin barrier can also predispose to the development of a skin abscesses, such as atopic dermatitis.      ED Prescriptions     Medication Sig Dispense Auth. Provider   doxycycline (VIBRAMYCIN) 100 MG capsule Take 1 capsule (100 mg total) by mouth 2 (two) times daily. 14 capsule Willis Holquin, Elita Boone, NP      PDMP not reviewed this encounter.   Valinda Hoar, [place] 03/14/21 [phone]

## 2021-03-23 ENCOUNTER — Ambulatory Visit (INDEPENDENT_AMBULATORY_CARE_PROVIDER_SITE_OTHER): Payer: Medicaid Other | Admitting: Primary Care

## 2021-03-24 ENCOUNTER — Other Ambulatory Visit (HOSPITAL_COMMUNITY): Payer: Self-pay

## 2021-03-24 ENCOUNTER — Other Ambulatory Visit: Payer: Self-pay

## 2021-03-27 ENCOUNTER — Other Ambulatory Visit: Payer: Self-pay

## 2021-04-04 ENCOUNTER — Encounter (INDEPENDENT_AMBULATORY_CARE_PROVIDER_SITE_OTHER): Payer: Self-pay | Admitting: Primary Care

## 2021-04-04 ENCOUNTER — Ambulatory Visit (INDEPENDENT_AMBULATORY_CARE_PROVIDER_SITE_OTHER): Payer: Medicaid Other | Admitting: Primary Care

## 2021-04-04 ENCOUNTER — Other Ambulatory Visit: Payer: Self-pay

## 2021-04-04 VITALS — BP 126/83 | HR 71 | Temp 97.5°F | Ht 72.0 in | Wt 140.6 lb

## 2021-04-04 DIAGNOSIS — I10 Essential (primary) hypertension: Secondary | ICD-10-CM

## 2021-04-04 DIAGNOSIS — Z760 Encounter for issue of repeat prescription: Secondary | ICD-10-CM

## 2021-04-04 DIAGNOSIS — Z1211 Encounter for screening for malignant neoplasm of colon: Secondary | ICD-10-CM | POA: Diagnosis not present

## 2021-04-04 MED ORDER — METOPROLOL TARTRATE 25 MG PO TABS
ORAL_TABLET | Freq: Two times a day (BID) | ORAL | 1 refills | Status: DC
  Filled 2021-04-04 – 2021-07-20 (×2): qty 180, 90d supply, fill #0
  Filled 2021-11-10: qty 180, 90d supply, fill #1

## 2021-04-04 MED ORDER — HYDROCHLOROTHIAZIDE 25 MG PO TABS
ORAL_TABLET | Freq: Every day | ORAL | 1 refills | Status: DC
  Filled 2021-04-04: qty 90, fill #0
  Filled 2021-07-20: qty 90, 90d supply, fill #0
  Filled 2021-11-10: qty 90, 90d supply, fill #1

## 2021-04-04 MED ORDER — AMLODIPINE BESYLATE 10 MG PO TABS
ORAL_TABLET | Freq: Every day | ORAL | 1 refills | Status: DC
  Filled 2021-04-04: qty 90, fill #0
  Filled 2021-04-27: qty 90, 90d supply, fill #0
  Filled 2021-07-20: qty 90, 90d supply, fill #1

## 2021-04-04 NOTE — Progress Notes (Signed)
Pt is not fasting  May need refills

## 2021-04-07 ENCOUNTER — Encounter (INDEPENDENT_AMBULATORY_CARE_PROVIDER_SITE_OTHER): Payer: Self-pay | Admitting: Primary Care

## 2021-04-14 NOTE — Progress Notes (Signed)
[facility]  Robert Carney, is a 56 y.o. male  TKZ:[id number]  TDD:[id number]  DOB - 11/14/1965  Chief Complaint  Patient presents with   Blood Pressure Check       Subjective:   Robert Carney is a 56 y.o. male here today for a follow up visit. Patient has No headache, No chest pain, No abdominal pain - No Nausea, No new weakness tingling or numbness, No Cough - SOB.  No problems updated.  ALLERGIES: Allergies  Allergen Reactions   No Known Allergies     PAST MEDICAL HISTORY: Past Medical History:  Diagnosis Date   Cervical myelopathy (HCC) 11/01/2016   Gait abnormality 11/01/2016   Hypertension    no longer taking meds    MEDICATIONS AT HOME: Prior to Admission medications   Medication Sig Start Date End Date Taking? Authorizing Provider  gabapentin (NEURONTIN) 300 MG capsule TAKE 1 CAPSULE (300 MG TOTAL) BY MOUTH 3 (THREE) TIMES DAILY. 12/06/20 12/06/21 Yes [name], NP  amLODipine (NORVASC) 10 MG tablet TAKE 1 TABLET (10 MG TOTAL) BY MOUTH DAILY. 04/04/21 04/04/22  [name], NP  hydrochlorothiazide (HYDRODIURIL) 25 MG tablet TAKE 1 TABLET (25 MG TOTAL) BY MOUTH DAILY. 04/04/21 04/04/22  [name], NP  metoprolol tartrate (LOPRESSOR) 25 MG tablet TAKE 1 TABLET (25 MG TOTAL) BY MOUTH 2 (TWO) TIMES DAILY. 04/04/21 04/04/22  [name], NP    Objective:   Vitals:   04/04/21 1500  BP: 126/83  Pulse: 71  Temp: (!) 97.5 F (36.4 C)  TempSrc: Temporal  SpO2: 100%  Weight: 140 lb 9.6 oz (63.8 kg)  Height: 6' (1.829 m)   Exam General appearance : Awake, alert, not in any distress. Speech Clear. Not toxic looking HEENT: Atraumatic and Normocephalic, pupils equally reactive to light and accomodation Neck: Supple, no JVD. No cervical lymphadenopathy.  Chest: Good air entry bilaterally, no added sounds  CVS: S1 S2 regular, no murmurs.  Abdomen: Bowel sounds present, Non tender and not distended with no gaurding,  rigidity or rebound. Extremities: B/L Lower Ext shows no edema, both legs are warm to touch Neurology: Awake alert, and oriented X 3, CN II-XII intact, Non focal Skin: No Rash  Data Review Lab Results  Component Value Date   HGBA1C 5.2 05/17/2020    Assessment & Plan   1. Essential hypertension Well controlled at blood pressure goal of less than 130/80, low-sodium, DASH diet, medication compliance, 150 minutes of moderate intensity exercise per week. Discussed medication compliance, adverse effects.  - amLODipine (NORVASC) 10 MG tablet; TAKE 1 TABLET (10 MG TOTAL) BY MOUTH DAILY.  Dispense: 90 tablet; Refill: 1 - hydrochlorothiazide (HYDRODIURIL) 25 MG tablet; TAKE 1 TABLET (25 MG TOTAL) BY MOUTH DAILY.  Dispense: 90 tablet; Refill: 1 - metoprolol tartrate (LOPRESSOR) 25 MG tablet; TAKE 1 TABLET (25 MG TOTAL) BY MOUTH 2 (TWO) TIMES DAILY.  Dispense: 180 tablet; Refill: 1  2. Medication refill - amLODipine (NORVASC) 10 MG tablet; TAKE 1 TABLET (10 MG TOTAL) BY MOUTH DAILY.  Dispense: 90 tablet; Refill: 1 - hydrochlorothiazide (HYDRODIURIL) 25 MG tablet; TAKE 1 TABLET (25 MG TOTAL) BY MOUTH DAILY.  Dispense: 90 tablet; Refill: 1 - metoprolol tartrate (LOPRESSOR) 25 MG tablet; TAKE 1 TABLET (25 MG TOTAL) BY MOUTH 2 (TWO) TIMES DAILY.  Dispense: 180 tablet; Refill: 1  3. Colon cancer screening Referred to GI canceled order will re-eval on f/u importance of colon cancer screening    Patient have been counseled  extensively about nutrition and exercise. Other issues discussed during this visit include: low cholesterol diet, weight control and daily exercise, foot care, annual eye examinations at Ophthalmology, importance of adherence with medications and regular follow-up. We also discussed long term complications of uncontrolled diabetes and hypertension.   Return in about 6 months (around 10/02/2021) for Bp fasting labs.  The patient was given clear instructions to go to ER or return to  medical center if symptoms don't improve, worsen or new problems develop. The patient verbalized understanding. The patient was told to call to get lab results if they haven't heard anything in the next week.   This note has been created with Education officer, environmental. Any transcriptional errors are unintentional.   [name], NP 04/14/2021, 12:47 PM

## 2021-04-27 ENCOUNTER — Other Ambulatory Visit: Payer: Self-pay

## 2021-07-20 ENCOUNTER — Other Ambulatory Visit: Payer: Self-pay

## 2021-07-21 ENCOUNTER — Other Ambulatory Visit: Payer: Self-pay

## 2021-10-02 ENCOUNTER — Ambulatory Visit (INDEPENDENT_AMBULATORY_CARE_PROVIDER_SITE_OTHER): Payer: Medicaid Other | Admitting: Primary Care

## 2021-10-02 ENCOUNTER — Encounter (INDEPENDENT_AMBULATORY_CARE_PROVIDER_SITE_OTHER): Payer: Self-pay | Admitting: Primary Care

## 2021-10-02 VITALS — BP 121/80 | HR 59 | Temp 98.1°F | Ht 72.0 in | Wt 133.2 lb

## 2021-10-02 DIAGNOSIS — I10 Essential (primary) hypertension: Secondary | ICD-10-CM | POA: Diagnosis not present

## 2021-10-02 DIAGNOSIS — Z23 Encounter for immunization: Secondary | ICD-10-CM

## 2021-10-02 NOTE — Progress Notes (Unsigned)
  [facility]  Taryn Nave, is a 56 y.o. male  UVO:[id number]  VQQ:[id number]  DOB - Dec 24, 1965  Chief Complaint  Patient presents with   Blood Pressure Check       Subjective:   Robert Carney is a 56 y.o. male here today for a follow up visit. Patient has No headache, No chest pain, No abdominal pain - No Nausea, No new weakness tingling or numbness, No Cough - shortness of breath  No problems updated.  Allergies  Allergen Reactions   No Known Allergies     Past Medical History:  Diagnosis Date   Cervical myelopathy (HCC) 11/01/2016   Gait abnormality 11/01/2016   Hypertension    no longer taking meds    Current Outpatient Medications on File Prior to Visit  Medication Sig Dispense Refill   amLODipine (NORVASC) 10 MG tablet TAKE 1 TABLET (10 MG TOTAL) BY MOUTH DAILY. 90 tablet 1   gabapentin (NEURONTIN) 300 MG capsule TAKE 1 CAPSULE (300 MG TOTAL) BY MOUTH 3 (THREE) TIMES DAILY. 270 capsule 5   hydrochlorothiazide (HYDRODIURIL) 25 MG tablet TAKE 1 TABLET (25 MG TOTAL) BY MOUTH DAILY. 90 tablet 1   metoprolol tartrate (LOPRESSOR) 25 MG tablet TAKE 1 TABLET (25 MG TOTAL) BY MOUTH 2 (TWO) TIMES DAILY. 180 tablet 1   No current facility-administered medications on file prior to visit.    Objective:   Vitals:   10/02/21 0840  BP: 121/80  Pulse: (!) 59  Temp: 98.1 F (36.7 C)  TempSrc: Oral  SpO2: 100%  Weight: 133 lb 3.2 oz (60.4 kg)  Height: 6' (1.829 m)    Exam General appearance : Awake, alert, not in any distress. Speech Clear. Not toxic looking HEENT: Atraumatic and Normocephalic, pupils equally reactive to light and accomodation Neck: Supple, no JVD. No cervical lymphadenopathy.  Chest: Good air entry bilaterally, no added sounds  CVS: S1 S2 regular, no murmurs.  Abdomen: Bowel sounds present, Non tender and not distended with no gaurding, rigidity or rebound. Extremities: B/L Lower Ext shows no edema, both legs are warm to  touch Neurology: Awake alert, and oriented X 3, CN II-XII intact, Non focal Skin: No Rash  Data Review Lab Results  Component Value Date   HGBA1C 5.2 05/17/2020    Assessment & Plan   1. Need for shingles vaccine *** - Varicella-zoster vaccine IM (Shingrix)  2. Essential hypertension ***    Patient have been counseled extensively about nutrition and exercise. Other issues discussed during this visit include: low cholesterol diet, weight control and daily exercise, foot care, annual eye examinations at Ophthalmology, importance of adherence with medications and regular follow-up. We also discussed long term complications of uncontrolled diabetes and hypertension.   Return in about 6 months (around 04/04/2022) for htn/fasting lab.  The patient was given clear instructions to go to ER or return to medical center if symptoms don't improve, worsen or new problems develop. The patient verbalized understanding. The patient was told to call to get lab results if they haven't heard anything in the next week.   This note has been created with Education officer, environmental. Any transcriptional errors are unintentional.   [name], NP 10/02/2021, 9:01 AM

## 2021-11-10 ENCOUNTER — Other Ambulatory Visit (INDEPENDENT_AMBULATORY_CARE_PROVIDER_SITE_OTHER): Payer: Self-pay | Admitting: Primary Care

## 2021-11-10 ENCOUNTER — Other Ambulatory Visit: Payer: Self-pay

## 2021-11-10 ENCOUNTER — Other Ambulatory Visit (HOSPITAL_COMMUNITY): Payer: Self-pay

## 2021-11-10 DIAGNOSIS — Z760 Encounter for issue of repeat prescription: Secondary | ICD-10-CM

## 2021-11-10 DIAGNOSIS — I10 Essential (primary) hypertension: Secondary | ICD-10-CM

## 2021-11-10 MED ORDER — AMLODIPINE BESYLATE 10 MG PO TABS
ORAL_TABLET | Freq: Every day | ORAL | 1 refills | Status: DC
  Filled 2021-11-10: qty 90, 90d supply, fill #0
  Filled 2022-03-16 (×2): qty 90, 90d supply, fill #1

## 2021-11-13 ENCOUNTER — Other Ambulatory Visit: Payer: Self-pay

## 2021-11-14 ENCOUNTER — Other Ambulatory Visit: Payer: Self-pay

## 2022-03-16 ENCOUNTER — Other Ambulatory Visit (INDEPENDENT_AMBULATORY_CARE_PROVIDER_SITE_OTHER): Payer: Self-pay | Admitting: Primary Care

## 2022-03-16 ENCOUNTER — Other Ambulatory Visit: Payer: Self-pay

## 2022-03-16 DIAGNOSIS — Z760 Encounter for issue of repeat prescription: Secondary | ICD-10-CM

## 2022-03-16 DIAGNOSIS — I10 Essential (primary) hypertension: Secondary | ICD-10-CM

## 2022-03-16 DIAGNOSIS — G629 Polyneuropathy, unspecified: Secondary | ICD-10-CM

## 2022-03-17 ENCOUNTER — Other Ambulatory Visit: Payer: Self-pay

## 2022-03-17 MED ORDER — METOPROLOL TARTRATE 25 MG PO TABS
25.0000 mg | ORAL_TABLET | Freq: Two times a day (BID) | ORAL | 0 refills | Status: DC
  Filled 2022-03-17: qty 180, 90d supply, fill #0

## 2022-03-17 NOTE — Telephone Encounter (Signed)
Requested Prescriptions  Pending Prescriptions Disp Refills   gabapentin (NEURONTIN) 300 MG capsule 270 capsule 5    Sig: TAKE 1 CAPSULE (300 MG TOTAL) BY MOUTH 3 (THREE) TIMES DAILY.     Neurology: Anticonvulsants - gabapentin Failed - 03/16/2022  2:25 PM      Failed - Cr in normal range and within 360 days    Creatinine  Date Value Ref Range Status  10/02/2016 1.2 0.7 - 1.3 mg/dL Final   Creatinine, Ser  Date Value Ref Range Status  12/06/2020 0.92 0.76 - 1.27 mg/dL Final         Passed - Completed PHQ-2 or PHQ-9 in the last 360 days      Passed - Valid encounter within last 12 months    Recent Outpatient Visits           5 months ago Need for shingles vaccine   [facility] [name], NP   11 months ago Colon cancer screening   [facility] [name], NP   1 year ago Bilateral impacted cerumen   [facility] [name], NP   1 year ago Need for immunization against influenza   [facility] [name], NP   1 year ago Essential hypertension   [facility] [name], NP       Future Appointments             In 2 weeks Randa Evens, Kinnie Scales, NP [facility]             hydrochlorothiazide (HYDRODIURIL) 25 MG tablet 90 tablet 1    Sig: TAKE 1 TABLET (25 MG TOTAL) BY MOUTH DAILY.     Cardiovascular: Diuretics - Thiazide Failed - 03/16/2022  2:25 PM      Failed - Cr in normal range and within 180 days    Creatinine  Date Value Ref Range Status  10/02/2016 1.2 0.7 - 1.3 mg/dL Final   Creatinine, Ser  Date Value Ref Range Status  12/06/2020 0.92 0.76 - 1.27 mg/dL Final         Failed - K in normal range and within 180 days    Potassium  Date Value Ref Range Status  12/06/2020 4.1 3.5 - 5.2 mmol/L Final  10/02/2016 3.7 3.5 - 5.1 mEq/L Final         Failed - Na in normal range and  within 180 days    Sodium  Date Value Ref Range Status  12/06/2020 136 134 - 144 mmol/L Final  10/02/2016 140 136 - 145 mEq/L Final         Passed - Last BP in normal range    BP Readings from Last 1 Encounters:  10/02/21 121/80         Passed - Valid encounter within last 6 months    Recent Outpatient Visits           5 months ago Need for shingles vaccine   [facility] [name], NP   11 months ago Colon cancer screening   [facility] [name], NP   1 year ago Bilateral impacted cerumen   [facility] [name], NP   1 year ago Need for immunization against influenza   [facility] [name], NP   1 year  ago Essential hypertension   [facility] [name], NP       Future Appointments             In 2 weeks Randa Evens, Kinnie Scales, NP [facility]             metoprolol tartrate (LOPRESSOR) 25 MG tablet 180 tablet 0    Sig: TAKE 1 TABLET (25 MG TOTAL) BY MOUTH 2 (TWO) TIMES DAILY.     Cardiovascular:  Beta Blockers Passed - 03/16/2022  2:25 PM      Passed - Last BP in normal range    BP Readings from Last 1 Encounters:  10/02/21 121/80         Passed - Last Heart Rate in normal range    Pulse Readings from Last 1 Encounters:  10/02/21 (!) 59         Passed - Valid encounter within last 6 months    Recent Outpatient Visits           5 months ago Need for shingles vaccine   [facility] [name], NP   11 months ago Colon cancer screening   [facility] [name], NP   1 year ago Bilateral impacted cerumen   [facility] [name], NP   1 year ago Need for immunization against influenza   [facility] [name], NP   1 year ago Essential  hypertension   [facility] [name], NP       Future Appointments             In 2 weeks Randa Evens, Kinnie Scales, NP [facility]

## 2022-03-17 NOTE — Telephone Encounter (Signed)
Requested medication (s) are due for refill today: Yes  Requested medication (s) are on the active medication list: Yes  Last refill:  12/06/20 Gabapentin; 04/04/21 Hydrochlorothiazide  Future visit scheduled: Yes  Notes to clinic:  Unable to refill per protocol due to failed labs, no updated results.      Requested Prescriptions  Pending Prescriptions Disp Refills   gabapentin (NEURONTIN) 300 MG capsule 270 capsule 5    Sig: TAKE 1 CAPSULE (300 MG TOTAL) BY MOUTH 3 (THREE) TIMES DAILY.     Neurology: Anticonvulsants - gabapentin Failed - 03/16/2022  2:25 PM      Failed - Cr in normal range and within 360 days    Creatinine  Date Value Ref Range Status  10/02/2016 1.2 0.7 - 1.3 mg/dL Final   Creatinine, Ser  Date Value Ref Range Status  12/06/2020 0.92 0.76 - 1.27 mg/dL Final         Passed - Completed PHQ-2 or PHQ-9 in the last 360 days      Passed - Valid encounter within last 12 months    Recent Outpatient Visits           5 months ago Need for shingles vaccine   [facility] Kerin Perna, NP   11 months ago Colon cancer screening   [place] Kerin Perna, NP   1 year ago Bilateral impacted cerumen   [place] Kerin Perna, NP   1 year ago Need for immunization against influenza   [place], [place], NP   1 year ago Essential hypertension   [place] Kerin Perna, NP       Future Appointments             In 2 weeks Oletta Lamas, [place] Cage, NP [facility]             hydrochlorothiazide (HYDRODIURIL) 25 MG tablet 90 tablet 1    Sig: TAKE 1 TABLET (25 MG TOTAL) BY MOUTH DAILY.     Cardiovascular: Diuretics - Thiazide Failed - 03/16/2022  2:25 PM      Failed - Cr in normal range and within 180 days    Creatinine  Date Value Ref Range Status  10/02/2016 1.2 0.7 - 1.3 mg/dL  Final   Creatinine, Ser  Date Value Ref Range Status  12/06/2020 0.92 0.76 - 1.27 mg/dL Final         Failed - K in normal range and within 180 days    Potassium  Date Value Ref Range Status  12/06/2020 4.1 3.5 - 5.2 mmol/L Final  10/02/2016 3.7 3.5 - 5.1 mEq/L Final         Failed - Na in normal range and within 180 days    Sodium  Date Value Ref Range Status  12/06/2020 136 134 - 144 mmol/L Final  10/02/2016 140 136 - 145 mEq/L Final         Passed - Last BP in normal range    BP Readings from Last 1 Encounters:  10/02/21 121/80         Passed - Valid encounter within last 6 months    Recent Outpatient Visits           5 months ago Need for shingles vaccine   [facility] Kerin Perna, NP   11 months ago Colon cancer screening  [facility] [name], NP   1 year ago Bilateral impacted cerumen   [facility] [name], NP   1 year ago Need for immunization against influenza   [facility] [name], NP   1 year ago Essential hypertension   [facility] [name], NP       Future Appointments             In 2 weeks Randa Evens, Kinnie Scales, NP [facility]            Signed Prescriptions Disp Refills   metoprolol tartrate (LOPRESSOR) 25 MG tablet 180 tablet 0    Sig: TAKE 1 TABLET (25 MG TOTAL) BY MOUTH 2 (TWO) TIMES DAILY.     Cardiovascular:  Beta Blockers Passed - 03/16/2022  2:25 PM      Passed - Last BP in normal range    BP Readings from Last 1 Encounters:  10/02/21 121/80         Passed - Last Heart Rate in normal range    Pulse Readings from Last 1 Encounters:  10/02/21 (!) 59         Passed - Valid encounter within last 6 months    Recent Outpatient Visits           5 months ago Need for shingles vaccine   [facility] [name],  NP   11 months ago Colon cancer screening   [facility] [name], NP   1 year ago Bilateral impacted cerumen   [facility] [name], NP   1 year ago Need for immunization against influenza   [facility] [name], NP   1 year ago Essential hypertension   [facility] [name], NP       Future Appointments             In 2 weeks Randa Evens, Kinnie Scales, NP [facility]

## 2022-03-20 ENCOUNTER — Other Ambulatory Visit (INDEPENDENT_AMBULATORY_CARE_PROVIDER_SITE_OTHER): Payer: Self-pay | Admitting: *Deleted

## 2022-03-20 ENCOUNTER — Other Ambulatory Visit: Payer: Self-pay

## 2022-03-20 ENCOUNTER — Other Ambulatory Visit (INDEPENDENT_AMBULATORY_CARE_PROVIDER_SITE_OTHER): Payer: Self-pay | Admitting: Primary Care

## 2022-03-20 DIAGNOSIS — I10 Essential (primary) hypertension: Secondary | ICD-10-CM

## 2022-03-20 DIAGNOSIS — G629 Polyneuropathy, unspecified: Secondary | ICD-10-CM

## 2022-03-20 DIAGNOSIS — Z760 Encounter for issue of repeat prescription: Secondary | ICD-10-CM

## 2022-03-20 NOTE — Telephone Encounter (Signed)
Requested medication (s) are due for refill today: Yes  Requested medication (s) are on the active medication list: Yes  Last refill:  Gabapentin 12/06/20, Hydrochlorothiazide 04/04/21  Future visit scheduled: Yes  Notes to clinic:  Unable to refill per protocol due to failed labs, no updated results.      Requested Prescriptions  Pending Prescriptions Disp Refills   gabapentin (NEURONTIN) 300 MG capsule 270 capsule 5    Sig: TAKE 1 CAPSULE (300 MG TOTAL) BY MOUTH 3 (THREE) TIMES DAILY.     Neurology: Anticonvulsants - gabapentin Failed - 03/20/2022  3:29 PM      Failed - Cr in normal range and within 360 days    Creatinine  Date Value Ref Range Status  10/02/2016 1.2 0.7 - 1.3 mg/dL Final   Creatinine, Ser  Date Value Ref Range Status  12/06/2020 0.92 0.76 - 1.27 mg/dL Final         Passed - Completed PHQ-2 or PHQ-9 in the last 360 days      Passed - Valid encounter within last 12 months    Recent Outpatient Visits           5 months ago Need for shingles vaccine   [facility] Kerin Perna, NP   11 months ago Colon cancer screening   [place], Michelle P, NP   1 year ago Bilateral impacted cerumen   [facility] Kerin Perna, NP   1 year ago Need for immunization against influenza   [place], [place], NP   1 year ago Essential hypertension   [facility] Kerin Perna, NP       Future Appointments             In 2 weeks Oletta Lamas, [place] Cage, NP [facility]             hydrochlorothiazide (HYDRODIURIL) 25 MG tablet 90 tablet 1    Sig: TAKE 1 TABLET (25 MG TOTAL) BY MOUTH DAILY.     Cardiovascular: Diuretics - Thiazide Failed - 03/20/2022  3:29 PM      Failed - Cr in normal range and within 180 days    Creatinine  Date Value Ref Range Status  10/02/2016 1.2 0.7 - 1.3 mg/dL Final    Creatinine, Ser  Date Value Ref Range Status  12/06/2020 0.92 0.76 - 1.27 mg/dL Final         Failed - K in normal range and within 180 days    Potassium  Date Value Ref Range Status  12/06/2020 4.1 3.5 - 5.2 mmol/L Final  10/02/2016 3.7 3.5 - 5.1 mEq/L Final         Failed - Na in normal range and within 180 days    Sodium  Date Value Ref Range Status  12/06/2020 136 134 - 144 mmol/L Final  10/02/2016 140 136 - 145 mEq/L Final         Passed - Last BP in normal range    BP Readings from Last 1 Encounters:  10/02/21 121/80         Passed - Valid encounter within last 6 months    Recent Outpatient Visits           5 months ago Need for shingles vaccine   [facility] Kerin Perna, NP   11 months ago Colon cancer screening  [facility] Kerin Perna, NP   1 year ago Bilateral impacted cerumen   [place] Kerin Perna, NP   1 year ago Need for immunization against influenza   [place], [place], NP   1 year ago Essential hypertension   [place], NP       Future Appointments             In 2 weeks Oletta Lamas Milford Cage, NP [place]

## 2022-03-21 NOTE — Telephone Encounter (Signed)
Unable to refill per protocol, Rx request is too soon.  Requested Prescriptions  Pending Prescriptions Disp Refills   amLODipine (NORVASC) 10 MG tablet 90 tablet 1    Sig: TAKE 1 TABLET (10 MG TOTAL) BY MOUTH DAILY.     Cardiovascular: Calcium Channel Blockers 2 Passed - 03/20/2022  3:44 PM      Passed - Last BP in normal range    BP Readings from Last 1 Encounters:  10/02/21 121/80         Passed - Last Heart Rate in normal range    Pulse Readings from Last 1 Encounters:  10/02/21 (!) 59         Passed - Valid encounter within last 6 months    Recent Outpatient Visits           5 months ago Need for shingles vaccine   [facility] Kerin Perna, NP   11 months ago Colon cancer screening   [place] Kerin Perna, NP   1 year ago Bilateral impacted cerumen   [place] Kerin Perna, NP   1 year ago Need for immunization against influenza   [place], [place], NP   1 year ago Essential hypertension   [place] Kerin Perna, NP       Future Appointments             In 2 weeks Oletta Lamas, [place] Cage, NP [facility]             gabapentin (NEURONTIN) 300 MG capsule 270 capsule 5    Sig: TAKE 1 CAPSULE (300 MG TOTAL) BY MOUTH 3 (THREE) TIMES DAILY.     Neurology: Anticonvulsants - gabapentin Failed - 03/20/2022  3:44 PM      Failed - Cr in normal range and within 360 days    Creatinine  Date Value Ref Range Status  10/02/2016 1.2 0.7 - 1.3 mg/dL Final   Creatinine, Ser  Date Value Ref Range Status  12/06/2020 0.92 0.76 - 1.27 mg/dL Final         Passed - Completed PHQ-2 or PHQ-9 in the last 360 days      Passed - Valid encounter within last 12 months    Recent Outpatient Visits           5 months ago Need for shingles vaccine   [facility] Kerin Perna,  NP   11 months ago Colon cancer screening   [place], Michelle P, NP   1 year ago Bilateral impacted cerumen   [facility] Kerin Perna, NP   1 year ago Need for immunization against influenza   [place], Michelle P, NP   1 year ago Essential hypertension   [place] Kerin Perna, NP       Future Appointments             In 2 weeks Oletta Lamas, Mil[place] Cage, NP [facility]             metoprolol tartrate (LOPRESSOR) 25 MG tablet 180 tablet 0    Sig: Take 1 tablet (25 mg total) by mouth 2 (two) times daily.     Cardiovascular:  Beta Blockers Passed - 03/20/2022  3:44 PM  Passed - Last BP in normal range    BP Readings from Last 1 Encounters:  10/02/21 121/80         Passed - Last Heart Rate in normal range    Pulse Readings from Last 1 Encounters:  10/02/21 (!) 59         Passed - Valid encounter within last 6 months    Recent Outpatient Visits           5 months ago Need for shingles vaccine   [facility] Kerin Perna, NP   11 months ago Colon cancer screening   [place] Kerin Perna, NP   1 year ago Bilateral impacted cerumen   [place] Kerin Perna, NP   1 year ago Need for immunization against influenza   [place], [place], NP   1 year ago Essential hypertension   [place] Kerin Perna, NP       Future Appointments             In 2 weeks Oletta Lamas, Mil[place] Cage, NP [facility]             hydrochlorothiazide (HYDRODIURIL) 25 MG tablet 90 tablet 1    Sig: TAKE 1 TABLET (25 MG TOTAL) BY MOUTH DAILY.     Cardiovascular: Diuretics - Thiazide Failed - 03/20/2022  3:44 PM      Failed - Cr in normal range and within 180 days     Creatinine  Date Value Ref Range Status  10/02/2016 1.2 0.7 - 1.3 mg/dL Final   Creatinine, Ser  Date Value Ref Range Status  12/06/2020 0.92 0.76 - 1.27 mg/dL Final         Failed - K in normal range and within 180 days    Potassium  Date Value Ref Range Status  12/06/2020 4.1 3.5 - 5.2 mmol/L Final  10/02/2016 3.7 3.5 - 5.1 mEq/L Final         Failed - Na in normal range and within 180 days    Sodium  Date Value Ref Range Status  12/06/2020 136 134 - 144 mmol/L Final  10/02/2016 140 136 - 145 mEq/L Final         Passed - Last BP in normal range    BP Readings from Last 1 Encounters:  10/02/21 121/80         Passed - Valid encounter within last 6 months    Recent Outpatient Visits           5 months ago Need for shingles vaccine   [facility] Kerin Perna, NP   11 months ago Colon cancer screening   [place], Michelle P, NP   1 year ago Bilateral impacted cerumen   [place] Kerin Perna, NP   1 year ago Need for immunization against influenza   [place], [place], NP   1 year ago Essential hypertension   [place] Kerin Perna, NP       Future Appointments             In 2 weeks Kerin Perna, NP [place]

## 2022-03-22 ENCOUNTER — Other Ambulatory Visit: Payer: Self-pay

## 2022-03-26 ENCOUNTER — Other Ambulatory Visit: Payer: Self-pay

## 2022-04-03 ENCOUNTER — Ambulatory Visit (INDEPENDENT_AMBULATORY_CARE_PROVIDER_SITE_OTHER): Payer: Medicaid Other | Admitting: Primary Care

## 2022-04-04 ENCOUNTER — Ambulatory Visit (INDEPENDENT_AMBULATORY_CARE_PROVIDER_SITE_OTHER): Payer: Medicaid Other | Admitting: Primary Care

## 2022-04-18 ENCOUNTER — Other Ambulatory Visit (INDEPENDENT_AMBULATORY_CARE_PROVIDER_SITE_OTHER): Payer: Self-pay

## 2022-04-18 ENCOUNTER — Other Ambulatory Visit: Payer: Self-pay

## 2022-04-18 ENCOUNTER — Ambulatory Visit (INDEPENDENT_AMBULATORY_CARE_PROVIDER_SITE_OTHER): Payer: Medicare Other | Admitting: Primary Care

## 2022-04-18 ENCOUNTER — Encounter (INDEPENDENT_AMBULATORY_CARE_PROVIDER_SITE_OTHER): Payer: Self-pay | Admitting: Primary Care

## 2022-04-18 VITALS — BP 120/75 | HR 56 | Resp 16 | Ht 71.0 in | Wt 140.4 lb

## 2022-04-18 DIAGNOSIS — Z1211 Encounter for screening for malignant neoplasm of colon: Secondary | ICD-10-CM | POA: Diagnosis not present

## 2022-04-18 DIAGNOSIS — Z760 Encounter for issue of repeat prescription: Secondary | ICD-10-CM

## 2022-04-18 DIAGNOSIS — Z79899 Other long term (current) drug therapy: Secondary | ICD-10-CM

## 2022-04-18 DIAGNOSIS — Z23 Encounter for immunization: Secondary | ICD-10-CM

## 2022-04-18 DIAGNOSIS — R351 Nocturia: Secondary | ICD-10-CM

## 2022-04-18 DIAGNOSIS — I10 Essential (primary) hypertension: Secondary | ICD-10-CM

## 2022-04-18 MED ORDER — ZOSTER VAC RECOMB ADJUVANTED 50 MCG/0.5ML IM SUSR
0.5000 mL | Freq: Once | INTRAMUSCULAR | 1 refills | Status: AC
  Filled 2022-04-18: qty 0.5, 1d supply, fill #0

## 2022-04-18 MED ORDER — METOPROLOL TARTRATE 25 MG PO TABS
12.5000 mg | ORAL_TABLET | Freq: Two times a day (BID) | ORAL | 1 refills | Status: DC
  Filled 2022-04-18: qty 180, 180d supply, fill #0
  Filled 2023-01-29: qty 90, 90d supply, fill #0
  Filled 2023-04-16: qty 90, 90d supply, fill #1

## 2022-04-18 MED ORDER — METOPROLOL TARTRATE 25 MG PO TABS
25.0000 mg | ORAL_TABLET | Freq: Two times a day (BID) | ORAL | 1 refills | Status: DC
  Filled 2022-04-18: qty 180, 90d supply, fill #0

## 2022-04-18 MED ORDER — AMLODIPINE BESYLATE 10 MG PO TABS
ORAL_TABLET | Freq: Every day | ORAL | 1 refills | Status: DC
  Filled 2022-04-18: qty 90, fill #0
  Filled 2022-07-23: qty 90, 90d supply, fill #0

## 2022-04-18 MED ORDER — HYDROCHLOROTHIAZIDE 25 MG PO TABS
ORAL_TABLET | Freq: Every day | ORAL | 1 refills | Status: DC
  Filled 2022-04-18: qty 90, 90d supply, fill #0

## 2022-04-18 NOTE — Progress Notes (Signed)
Schriever, is a 57 y.o. male  FGH:[id number]  CVE:[id number]  DOB - Jul 21, 1965  Chief Complaint  Patient presents with   Hypertension       Subjective:   [name] is a 57 y.o. male here today for a follow up visit for the management of Hypertension. . Patient has No headache, No chest pain, No abdominal pain - No Nausea, No new weakness tingling or numbness, No Cough - shortness of breath. He states had 6 teeth removed approximately 2 weeks ago.  No problems updated.  Allergies  Allergen Reactions   No Known Allergies     Past Medical History:  Diagnosis Date   Cervical myelopathy ([place]) 11/01/2016   Gait abnormality 11/01/2016   Hypertension    no longer taking meds    Current Outpatient Medications on File Prior to Visit  Medication Sig Dispense Refill   amLODipine (NORVASC) 10 MG tablet TAKE 1 TABLET (10 MG TOTAL) BY MOUTH DAILY. 90 tablet 1   metoprolol tartrate (LOPRESSOR) 25 MG tablet Take 1 tablet (25 mg total) by mouth 2 (two) times daily. 180 tablet 0   gabapentin (NEURONTIN) 300 MG capsule TAKE 1 CAPSULE (300 MG TOTAL) BY MOUTH 3 (THREE) TIMES DAILY. 270 capsule 5   hydrochlorothiazide (HYDRODIURIL) 25 MG tablet TAKE 1 TABLET (25 MG TOTAL) BY MOUTH DAILY. 90 tablet 1   No current facility-administered medications on file prior to visit.    Objective:   Vitals:   04/18/22 1530  BP: 120/75  Pulse: (Abnormal) 56  Resp: 16  SpO2: 98%  Weight: 140 lb 6.4 oz (63.7 kg)  Height: 5\' 11"  (1.803 m)    Exam General appearance : Awake, alert, not in any distress. Speech Clear. Not toxic looking HEENT: Atraumatic and Normocephalic, pupils equally reactive to light and accomodation Neck: Supple, no JVD. No cervical lymphadenopathy.  Chest: Good air entry bilaterally, no added sounds  CVS: S1 S2 regular, no murmurs.  Abdomen: Bowel sounds present, Non tender and not distended with no gaurding, rigidity or  rebound. Extremities: B/L Lower Ext shows no edema, both legs are warm to touch Neurology: Awake alert, and oriented X 3, , Non focal Skin: No Rash  Data Review Lab Results  Component Value Date   HGBA1C 5.2 05/17/2020    Assessment & Plan  Robert Carney was seen today for hypertension.  Diagnoses and all orders for this visit:  Essential hypertension Well controlled BP goal - < 130/80 Explained that having normal blood pressure is the goal and medications are helping to get to goal and maintain normal blood pressure. DIET: Limit salt intake, read nutrition labels to check salt content, limit fried and high fatty foods  Avoid using multisymptom OTC cold preparations that generally contain sudafed which can rise BP. Consult with pharmacist on best cold relief products to use for persons with HTN EXERCISE Discussed incorporating exercise such as walking - 30 minutes most days of the week and can do in 10 minute intervals     Need for shingles vaccine -     Zoster Vaccine Adjuvanted [facility]) injection; Inject 0.5 mLs into the muscle once for 1 dose.  Colon cancer screening -     Cologuard   Medication refill -     hydrochlorothiazide (HYDRODIURIL) 25 MG tablet; TAKE 1 TABLET (25 MG TOTAL) BY MOUTH DAILY. -     Discontinue: metoprolol tartrate (LOPRESSOR) 25 MG tablet; Take 1 tablet (25 mg total) by mouth 2 (two)  times daily. -     amLODipine (NORVASC) 10 MG tablet; TAKE 1 TABLET (10 MG TOTAL) BY MOUTH DAILY. -     metoprolol tartrate (LOPRESSOR) 25 MG tablet; Take 0.5 tablets (12.5 mg total) by mouth 2 (two) times daily.  Nocturia -     PSA  Medication management -     CBC with Differential     Patient have been counseled extensively about nutrition and exercise. Other issues discussed during this visit include: low cholesterol diet, weight control and daily exercise, foot care, annual eye examinations at Ophthalmology, importance of adherence with medications and regular  follow-up. We also discussed long term complications of uncontrolled diabetes and hypertension.   Return for Schedule medicare wellness virtual .  The patient was given clear instructions to go to ER or return to medical center if symptoms don't improve, worsen or new problems develop. The patient verbalized understanding. The patient was told to call to get lab results if they haven't heard anything in the next week.   This note has been created with Surveyor, quantity. Any transcriptional errors are unintentional.   Kerin Perna, NP 04/18/2022, 3:58 PM

## 2022-04-19 ENCOUNTER — Telehealth (INDEPENDENT_AMBULATORY_CARE_PROVIDER_SITE_OTHER): Payer: Self-pay

## 2022-04-19 LAB — CBC WITH DIFFERENTIAL/PLATELET
Basophils Absolute: 0.1 10*3/uL (ref 0.0–0.2)
Basos: 1 %
EOS (ABSOLUTE): 0.2 10*3/uL (ref 0.0–0.4)
Eos: 4 %
Hematocrit: 37.4 % — ABNORMAL LOW (ref 37.5–51.0)
Hemoglobin: 12.5 g/dL — ABNORMAL LOW (ref 13.0–17.7)
Immature Grans (Abs): 0 10*3/uL (ref 0.0–0.1)
Immature Granulocytes: 0 %
Lymphocytes Absolute: 2.8 10*3/uL (ref 0.7–3.1)
Lymphs: 50 %
MCH: 30.1 pg (ref 26.6–33.0)
MCHC: 33.4 g/dL (ref 31.5–35.7)
MCV: 90 fL (ref 79–97)
Monocytes Absolute: 0.5 10*3/uL (ref 0.1–0.9)
Monocytes: 10 %
Neutrophils Absolute: 2 10*3/uL (ref 1.4–7.0)
Neutrophils: 35 %
Platelets: 303 10*3/uL (ref 150–450)
RBC: 4.15 x10E6/uL (ref 4.14–5.80)
RDW: 13.7 % (ref 11.6–15.4)
WBC: 5.7 10*3/uL (ref 3.4–10.8)

## 2022-04-19 LAB — CMP14+EGFR
ALT: 20 IU/L (ref 0–44)
AST: 52 IU/L — ABNORMAL HIGH (ref 0–40)
Albumin/Globulin Ratio: 1.2 (ref 1.2–2.2)
Albumin: 4.3 g/dL (ref 3.8–4.9)
Alkaline Phosphatase: 95 IU/L (ref 44–121)
BUN/Creatinine Ratio: 7 — ABNORMAL LOW (ref 9–20)
BUN: 6 mg/dL (ref 6–24)
Bilirubin Total: <0.2 mg/dL (ref 0.0–1.2)
CO2: 22 mmol/L (ref 20–29)
Calcium: 9.7 mg/dL (ref 8.7–10.2)
Chloride: 100 mmol/L (ref 96–106)
Creatinine, Ser: 0.81 mg/dL (ref 0.76–1.27)
Globulin, Total: 3.7 g/dL (ref 1.5–4.5)
Glucose: 90 mg/dL (ref 70–99)
Potassium: 5.1 mmol/L (ref 3.5–5.2)
Sodium: 138 mmol/L (ref 134–144)
Total Protein: 8 g/dL (ref 6.0–8.5)
eGFR: 103 mL/min/{1.73_m2} (ref >59)

## 2022-04-19 LAB — PSA: Prostate Specific Ag, Serum: 0.3 ng/mL (ref 0.0–4.0)

## 2022-04-19 NOTE — Telephone Encounter (Signed)
Contacted pt to go over lab results pt is aware and doesn't have any questions or concerns

## 2022-04-23 ENCOUNTER — Other Ambulatory Visit: Payer: Self-pay

## 2022-04-30 ENCOUNTER — Encounter (INDEPENDENT_AMBULATORY_CARE_PROVIDER_SITE_OTHER): Payer: Self-pay

## 2022-04-30 ENCOUNTER — Encounter (INDEPENDENT_AMBULATORY_CARE_PROVIDER_SITE_OTHER): Payer: Medicare Other | Admitting: Primary Care

## 2022-05-24 ENCOUNTER — Encounter: Payer: Self-pay | Admitting: Gastroenterology

## 2022-05-30 ENCOUNTER — Ambulatory Visit (AMBULATORY_SURGERY_CENTER): Payer: Medicare Other

## 2022-05-30 ENCOUNTER — Other Ambulatory Visit: Payer: Self-pay

## 2022-05-30 VITALS — Ht 71.0 in | Wt 135.0 lb

## 2022-05-30 DIAGNOSIS — Z1211 Encounter for screening for malignant neoplasm of colon: Secondary | ICD-10-CM

## 2022-05-30 MED ORDER — PEG 3350-KCL-NA BICARB-NACL 420 G PO SOLR
4000.0000 mL | Freq: Once | ORAL | 0 refills | Status: AC
  Filled 2022-05-30: qty 4000, 1d supply, fill #0

## 2022-05-30 NOTE — Progress Notes (Signed)
No egg or soy allergy known to patient  No issues known to pt with past sedation with any surgeries or procedures Patient denies ever being told they had issues or difficulty with intubation  No FH of Malignant Hyperthermia Pt is not on diet pills Pt is not on  home 02  Pt is not on blood thinners  Pt with intermit constipation  No A fib or A flutter Have any cardiac testing pending-- no  Pt instructed to use Singlecare.com or GoodRx for a price reduction on prep   Patient's chart reviewed by Osvaldo Angst CNRA prior to previsit and patient appropriate for the [place].  Previsit completed and red dot placed by patient's name on their procedure day (on provider's schedule).

## 2022-06-29 ENCOUNTER — Telehealth: Payer: Self-pay | Admitting: Gastroenterology

## 2022-06-29 NOTE — Telephone Encounter (Signed)
Patient called requesting a call back, he have some question regarding instructions for procedure 4/16

## 2022-06-29 NOTE — Telephone Encounter (Signed)
Pt.had lost prep instructions,decline in coming to office to pick up instructions,went over instructions over phone while pt.wrote down,verbalized that he was clear on how to take prep,does not have my chart.Marland Kitchenall questions answered prior to ending call.

## 2022-07-03 ENCOUNTER — Ambulatory Visit (AMBULATORY_SURGERY_CENTER): Payer: 59 | Admitting: Gastroenterology

## 2022-07-03 ENCOUNTER — Encounter: Payer: Self-pay | Admitting: Gastroenterology

## 2022-07-03 VITALS — BP 127/82 | HR 58 | Temp 97.7°F | Resp 11 | Ht 71.0 in | Wt 135.0 lb

## 2022-07-03 DIAGNOSIS — Z1211 Encounter for screening for malignant neoplasm of colon: Secondary | ICD-10-CM | POA: Diagnosis not present

## 2022-07-03 MED ORDER — SODIUM CHLORIDE 0.9 % IV SOLN
500.0000 mL | Freq: Once | INTRAVENOUS | Status: DC
Start: 2022-07-03 — End: 2022-07-03

## 2022-07-03 NOTE — Op Note (Signed)
[facility] Patient Name: Robert Carney Procedure Date: 07/03/2022 9:52 AM MRN: [id number] Endoscopist: Sherilyn Cooter L. [name] , MD, [id number] Age: 57 Referring MD:  Date of Birth: [birth year] Gender: Male Account #: [id number] Procedure:                Colonoscopy Indications:              Screening for colorectal malignant neoplasm, This                            is the patient's first colonoscopy Medicines:                Monitored Anesthesia Care Procedure:                Pre-Anesthesia Assessment:                           - Prior to the procedure, a History and Physical                            was performed, and patient medications and                            allergies were reviewed. The patient's tolerance of                            previous anesthesia was also reviewed. The risks                            and benefits of the procedure and the sedation                            options and risks were discussed with the patient.                            All questions were answered, and informed consent                            was obtained. Prior Anticoagulants: The patient has                            taken no anticoagulant or antiplatelet agents. ASA                            Grade Assessment: II - A patient with mild systemic                            disease. After reviewing the risks and benefits,                            the patient was deemed in satisfactory condition to                            undergo the procedure.  After obtaining informed consent, the colonoscope                            was passed under direct vision. Throughout the                            procedure, the patient's blood pressure, pulse, and                            oxygen saturations were monitored continuously. The                            Olympus CF-HQ190L [id number]) Colonoscope was                            introduced through the anus  and advanced to the the                            cecum, identified by appendiceal orifice and                            ileocecal valve. The colonoscopy was performed                            without difficulty. The patient tolerated the                            procedure well. The quality of the bowel                            preparation was good. The ileocecal valve,                            appendiceal orifice, and rectum were photographed. Scope In: 10:38:55 AM Scope Out: 10:52:06 AM Scope Withdrawal Time: 0 hours 8 minutes 36 seconds  Total Procedure Duration: 0 hours 13 minutes 11 seconds  Findings:                 The perianal and digital rectal examinations were                            normal.                           Repeat examination of right colon under NBI                            performed.                           A few small-mouthed diverticula were found in the                            left colon.  Internal hemorrhoids were found.                           The exam was otherwise without abnormality on                            direct and retroflexion views. Complications:            No immediate complications. Estimated Blood Loss:     Estimated blood loss: none. Impression:               - Diverticulosis in the left colon.                           - Internal hemorrhoids.                           - The examination was otherwise normal on direct                            and retroflexion views.                           - No specimens collected.                           - The GI Genius (intelligent endoscopy module),                            computer-aided polyp detection system powered by AI                            was utilized to detect colorectal polyps through                            enhanced visualization during colonoscopy. Recommendation:           - Patient has a contact number available for                             emergencies. The signs and symptoms of potential                            delayed complications were discussed with the                            patient. Return to normal activities tomorrow.                            Written discharge instructions were provided to the                            patient.                           - Resume previous diet.                           -  Continue present medications.                           - Repeat colonoscopy in 10 years for screening                            purposes. Kynsley Whitehouse L. [name], MD 07/03/2022 10:56:29 AM This report has been signed electronically.

## 2022-07-03 NOTE — Patient Instructions (Signed)
Handouts provided on diverticulosis and hemorrhoids.   Resume previous diet.  Continue present medications.  Repeat colonoscopy in 10 years for screening purposes.   YOU HAD AN ENDOSCOPIC PROCEDURE TODAY AT THE [facility]:   Refer to the procedure report that was given to you for any specific questions about what was found during the examination.  If the procedure report does not answer your questions, please call your gastroenterologist to clarify.  If you requested that your care partner not be given the details of your procedure findings, then the procedure report has been included in a sealed envelope for you to review at your convenience later.  YOU SHOULD EXPECT: Some feelings of bloating in the abdomen. Passage of more gas than usual.  Walking can help get rid of the air that was put into your GI tract during the procedure and reduce the bloating. If you had a lower endoscopy (such as a colonoscopy or flexible sigmoidoscopy) you may notice spotting of blood in your stool or on the toilet paper. If you underwent a bowel prep for your procedure, you may not have a normal bowel movement for a few days.  Please Note:  You might notice some irritation and congestion in your nose or some drainage.  This is from the oxygen used during your procedure.  There is no need for concern and it should clear up in a day or so.  SYMPTOMS TO REPORT IMMEDIATELY:  Following lower endoscopy (colonoscopy or flexible sigmoidoscopy):  Excessive amounts of blood in the stool  Significant tenderness or worsening of abdominal pains  Swelling of the abdomen that is new, acute  Fever of 100F or higher  For urgent or emergent issues, a gastroenterologist can be reached at any hour by calling [phone]. Do not use MyChart messaging for urgent concerns.    DIET:  We do recommend a small meal at first, but then you may proceed to your regular diet.  Drink plenty of fluids but you should avoid  alcoholic beverages for 24 hours.  ACTIVITY:  You should plan to take it easy for the rest of today and you should NOT DRIVE or use heavy machinery until tomorrow (because of the sedation medicines used during the test).    FOLLOW UP: Our staff will call the number listed on your records the next business day following your procedure.  We will call around 7:15- 8:00 am to check on you and address any questions or concerns that you may have regarding the information given to you following your procedure. If we do not reach you, we will leave a message.     If any biopsies were taken you will be contacted by phone or by letter within the next 1-3 weeks.  Please call us at [phone] if you have not heard about the biopsies in 3 weeks.    SIGNATURES/CONFIDENTIALITY: You and/or your care partner have signed paperwork which will be entered into your electronic medical record.  These signatures attest to the fact that that the information above on your After Visit Summary has been reviewed and is understood.  Full responsibility of the confidentiality of this discharge information lies with you and/or your care-partner.

## 2022-07-03 NOTE — Progress Notes (Signed)
History and Physical:  This patient presents for endoscopic testing for: Encounter Diagnosis  Name Primary?   Special screening for malignant neoplasms, colon Yes    Average risk for colorectal cancer.  First screening exam. Patient denies chronic abdominal pain, rectal bleeding, constipation or diarrhea.   Patient is otherwise without complaints or active issues today.   Past Medical History: Past Medical History:  Diagnosis Date   Cervical myelopathy 11/01/2016   Gait abnormality 11/01/2016   Hypertension    no longer taking meds     Past Surgical History: Past Surgical History:  Procedure Laterality Date   ANTERIOR CERVICAL CORPECTOMY N/A 03/26/2016   Procedure: Cervical  Seven Anterior cervical corpectomy;  Surgeon: [name], MD;  Location: [facility] OR;  Service: Neurosurgery;  Laterality: N/A;  Cervical  Seven Anterior cervical corpectomy   tooth pulled      Allergies: Allergies  Allergen Reactions   No Known Allergies     Outpatient Meds: Current Outpatient Medications  Medication Sig Dispense Refill   amLODipine (NORVASC) 10 MG tablet TAKE 1 TABLET (10 MG TOTAL) BY MOUTH DAILY. 90 tablet 1   Cyanocobalamin (VITAMIN B 12 PO) Take by mouth.     hydrochlorothiazide (HYDRODIURIL) 25 MG tablet TAKE 1 TABLET (25 MG TOTAL) BY MOUTH DAILY. 90 tablet 1   metoprolol tartrate (LOPRESSOR) 25 MG tablet Take 0.5 tablets (12.5 mg total) by mouth 2 (two) times daily. 180 tablet 1   Current Facility-Administered Medications  Medication Dose Route Frequency Provider Last Rate Last Admin   0.9 %  sodium chloride infusion  500 mL Intravenous Once Sherrilyn Rist, MD          ___________________________________________________________________ Objective   Exam:  BP 105/63   Pulse 66   Temp 97.7 F (36.5 C)   Ht  (1.803 m)   Wt 135 lb (61.2 kg)   SpO2 98%   BMI 18.83 kg/m   CV: regular , S1/S2 Resp: clear to auscultation bilaterally, normal RR and  effort noted GI: soft, no tenderness, with active bowel sounds.   Assessment: Encounter Diagnosis  Name Primary?   Special screening for malignant neoplasms, colon Yes     Plan: Colonoscopy  The benefits and risks of the planned procedure were described in detail with the patient or (when appropriate) their health care proxy.  Risks were outlined as including, but not limited to, bleeding, infection, perforation, adverse medication reaction leading to cardiac or pulmonary decompensation, pancreatitis (if ERCP).  The limitation of incomplete mucosal visualization was also discussed.  No guarantees or warranties were given.    The patient is appropriate for an endoscopic procedure in the ambulatory setting.   - Amada Jupiter, MD

## 2022-07-03 NOTE — Progress Notes (Signed)
Sedate, gd SR, tolerated procedure well, VSS, report to RN 

## 2022-07-03 NOTE — Progress Notes (Signed)
Pt's states no medical or surgical changes since previsit or office visit. 

## 2022-07-04 ENCOUNTER — Telehealth: Payer: Self-pay | Admitting: *Deleted

## 2022-07-04 NOTE — Telephone Encounter (Signed)
  Follow up Call-     07/03/2022    9:43 AM 07/03/2022    9:40 AM  Call back number  Post procedure Call Back phone  # [phone]  Permission to leave phone message Yes Yes     Patient questions:  Do you have a fever, pain , or abdominal swelling? No. Pain Score  0 *  Have you tolerated food without any problems? Yes.    Have you been able to return to your normal activities? Yes.    Do you have any questions about your discharge instructions: Diet   No. Medications  No. Follow up visit  No.  Do you have questions or concerns about your Care? No.  Actions: * If pain score is 4 or above: No action needed, pain <4.

## 2022-07-23 ENCOUNTER — Other Ambulatory Visit: Payer: Self-pay

## 2022-08-02 ENCOUNTER — Encounter (INDEPENDENT_AMBULATORY_CARE_PROVIDER_SITE_OTHER): Payer: Self-pay

## 2022-10-23 ENCOUNTER — Encounter (INDEPENDENT_AMBULATORY_CARE_PROVIDER_SITE_OTHER): Payer: Self-pay

## 2022-12-19 ENCOUNTER — Ambulatory Visit (HOSPITAL_COMMUNITY)
Admission: EM | Admit: 2022-12-19 | Discharge: 2022-12-19 | Disposition: A | Discharge status: Home / Self Care | Payer: 59 | Attending: Internal Medicine | Admitting: Internal Medicine

## 2022-12-19 ENCOUNTER — Encounter (HOSPITAL_COMMUNITY): Payer: Self-pay | Admitting: Emergency Medicine

## 2022-12-19 DIAGNOSIS — E876 Hypokalemia: Secondary | ICD-10-CM | POA: Insufficient documentation

## 2022-12-19 DIAGNOSIS — E861 Hypovolemia: Secondary | ICD-10-CM | POA: Diagnosis present

## 2022-12-19 DIAGNOSIS — R42 Dizziness and giddiness: Secondary | ICD-10-CM | POA: Diagnosis not present

## 2022-12-19 DIAGNOSIS — N179 Acute kidney failure, unspecified: Secondary | ICD-10-CM | POA: Diagnosis not present

## 2022-12-19 DIAGNOSIS — E860 Dehydration: Secondary | ICD-10-CM | POA: Diagnosis present

## 2022-12-19 LAB — CBC
HCT: 45.2 % (ref 39.0–52.0)
Hemoglobin: 14.1 g/dL (ref 13.0–17.0)
MCH: 29.9 pg (ref 26.0–34.0)
MCHC: 31.2 g/dL (ref 30.0–36.0)
MCV: 95.8 fL (ref 80.0–100.0)
Platelets: 100 10*3/uL — ABNORMAL LOW (ref 150–400)
RBC: 4.72 MIL/uL (ref 4.22–5.81)
RDW: 11.9 % (ref 11.5–15.5)
WBC: 3.4 10*3/uL — ABNORMAL LOW (ref 4.0–10.5)
nRBC: 0 % (ref 0.0–0.2)

## 2022-12-19 LAB — BASIC METABOLIC PANEL
Anion gap: 14 (ref 5–15)
BUN: 10 mg/dL (ref 6–20)
CO2: 22 mmol/L (ref 22–32)
Calcium: 9 mg/dL (ref 8.9–10.3)
Chloride: 92 mmol/L — ABNORMAL LOW (ref 98–111)
Creatinine, Ser: 1.33 mg/dL — ABNORMAL HIGH (ref 0.61–1.24)
GFR, Estimated: >60 mL/min (ref >60)
Glucose, Bld: 128 mg/dL — ABNORMAL HIGH (ref 70–99)
Potassium: 3.1 mmol/L — ABNORMAL LOW (ref 3.5–5.1)
Sodium: 128 mmol/L — ABNORMAL LOW (ref 135–145)

## 2022-12-19 LAB — POCT FASTING CBG KUC MANUAL ENTRY: POCT Glucose (KUC): 111 mg/dL — AB (ref 70–99)

## 2022-12-19 MED ORDER — LOPERAMIDE HCL 2 MG PO CAPS: 2.0000 mg | ORAL_CAPSULE | Freq: Four times a day (QID) | ORAL | 0 refills | Status: DC | PRN

## 2022-12-19 MED ORDER — SODIUM CHLORIDE 0.9 % IV BOLUS
1000.0000 mL | Freq: Once | INTRAVENOUS | Status: AC
  Administered 2022-12-19: 1000 mL via INTRAVENOUS

## 2022-12-19 MED ORDER — POTASSIUM CHLORIDE ER 10 MEQ PO TBCR
20.0000 meq | EXTENDED_RELEASE_TABLET | Freq: Every day | ORAL | 0 refills | Status: DC
  Filled 2022-12-19: qty 2, 1d supply, fill #0

## 2022-12-19 MED ORDER — POTASSIUM CHLORIDE ER 10 MEQ PO TBCR: 20.0000 meq | EXTENDED_RELEASE_TABLET | Freq: Every day | ORAL | 0 refills | Status: DC

## 2022-12-19 MED ORDER — LOPERAMIDE HCL 2 MG PO CAPS
2.0000 mg | ORAL_CAPSULE | Freq: Four times a day (QID) | ORAL | 0 refills | Status: DC | PRN
  Filled 2022-12-19: qty 12, 3d supply, fill #0

## 2022-12-19 NOTE — Discharge Instructions (Addendum)
You were very dehydrated before we gave you IV fluids. We gave you IV fluids to help rehydrate you after all of the diarrhea that you have had. Take Imodium as needed for diarrhea. Continue drinking plenty of fluids to stay well-hydrated. You may drink Pedialyte as this has extra electrolytes and to help with hydration. Your potassium was a little bit low today.  Take potassium 40 mEq today. Please return to the clinic tomorrow to have your blood work rechecked to make sure that your kidney function and electrolytes have returned to normal. If your symptoms worsen or return, please go to the nearest emergency department for further workup and evaluation. Feel better!

## 2022-12-19 NOTE — ED Provider Notes (Signed)
[facility]    CSN: [id number] Arrival date & time: 12/19/22  1444      History   Chief Complaint Chief Complaint  Patient presents with   Near Syncope    HPI Robert Carney is a 57 y.o. male.   Patient presents to urgent care for evaluation of diarrhea for the last 3 days and subsequent dizziness.  Dizziness happens while seated sometimes but mostly happens with position changes.  No chest pain, shortness of breath, heart palpitations, leg swelling, or syncope.  No nausea, vomiting, abdominal pain, body aches, fever/chills, urinary symptoms, headaches, or rash associated with symptoms.  No recent travel, sick contacts, or recent antibiotic/steroid use.  No recent dietary changes.  He last ate this morning.  He is not a diabetic.  History of alcohol abuse.  Drinks 3-4 beers per day.  Has not had any alcohol in the last 3 days and states that he "feels better than normal".  Denies tremor, hallucinations when he stops drinking alcohol.  States he feels weak overall and has been trying to drink plenty of water but feels like he is very dehydrated.  Hypotensive on arrival in triage with blood pressure in the 80s over 60s.  Normal heart rate.  Has not attempted use of any over-the-counter medications to help with symptoms PTA.   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 Authorizing Provider  amLODipine (NORVASC) 10 MG tablet TAKE 1 TABLET (10 MG TOTAL) BY MOUTH DAILY. 04/18/22 04/18/23 Yes [name], NP  metoprolol tartrate (LOPRESSOR) 25 MG tablet Take 0.5 tablets (12.5 mg total) by mouth 2 (two) times daily. 04/18/22  Yes [name], NP  Cyanocobalamin (VITAMIN B 12 PO) Take by mouth.    [provider]  hydrochlorothiazide (HYDRODIURIL) 25 MG tablet TAKE 1 TABLET (25 MG TOTAL) BY MOUTH DAILY. 04/18/22   [name], NP  loperamide (IMODIUM) 2 MG capsule Take 1 capsule (2 mg total) by mouth 4 (four) times daily as needed for diarrhea or loose stools. 12/19/22   Carlisle Beers, FNP  potassium chloride (KLOR-CON) 10 MEQ tablet Take 2 tablets (20 mEq total) by mouth daily. 12/19/22   Carlisle Beers, FNP    Family History Family History  Problem Relation Age of Onset   Diabetes Mother    Lung cancer Mother    Hypertension Mother  Prostate cancer Father    Hypertension Father    Breast cancer Sister    Breast cancer Maternal Aunt    Colon cancer Neg Hx    Colon polyps Neg Hx    Esophageal cancer Neg Hx    Rectal cancer Neg Hx    Stomach cancer Neg Hx     Social History Social History   Tobacco Use   Smoking status: Some Days    Current packs/day: 0.20    Types: Cigarettes   Smokeless tobacco: Never  Vaping Use   Vaping status: Never Used  Substance Use Topics   Alcohol use: Yes    Alcohol/week: 42.0 standard drinks of alcohol    Types:  42 Cans of beer per week    Comment: Daily beer drinker   Drug use: Yes    Types: Marijuana    Comment: No history of IV drug use.     Allergies   No known allergies   Review of Systems Review of Systems  Cardiovascular:  Positive for near-syncope.  Per HPI   Physical Exam Triage Vital Signs ED Triage Vitals  Encounter Vitals Group     BP 12/19/22 1534 (!) 89/74     Systolic BP Percentile --      Diastolic BP Percentile --      Pulse Rate 12/19/22 1534 85     Resp 12/19/22 1534 16     Temp 12/19/22 1534 98.5 F (36.9 C)     Temp Source 12/19/22 1534 Oral     SpO2 12/19/22 1534 100 %     Weight --      Height --      Head Circumference --      Peak Flow --      Pain Score 12/19/22 1538 0     Pain Loc --      Pain Education --      Exclude from Growth Chart --    Orthostatic VS for the past 24 hrs:  BP- Lying Pulse- Lying BP- Sitting Pulse- Sitting BP- Standing at 0 minutes Pulse- Standing at 0 minutes  12/19/22 1754 124/82 71 107/74 77 107/76 82    Updated Vital Signs BP (!) 89/74 (BP Location: Left Arm)   Pulse 85   Temp 98.5 F (36.9 C) (Oral)   Resp 16   SpO2 100%   Visual Acuity Right Eye Distance:   Left Eye Distance:   Bilateral Distance:    Right Eye Near:   Left Eye Near:    Bilateral Near:     Physical Exam Vitals and nursing note reviewed.  Constitutional:      Appearance: He is not ill-appearing or toxic-appearing.  HENT:     Head: Normocephalic and atraumatic.     Right Ear: Hearing, tympanic membrane, ear canal and external ear normal.     Left Ear: Hearing, tympanic membrane, ear canal and external ear normal.     Nose: Nose normal.     Mouth/Throat:     Lips: Pink.     Mouth: Mucous membranes are dry. No injury.     Tongue: No lesions. Tongue does not deviate from midline.     Palate: No mass and lesions.     Pharynx: Oropharynx is clear. Uvula midline. No pharyngeal swelling, oropharyngeal exudate, posterior oropharyngeal  erythema or uvula swelling.     Tonsils: No tonsillar exudate or tonsillar abscesses.  Eyes:     General: Lids are normal. Vision grossly intact. Gaze aligned  appropriately.     Extraocular Movements: Extraocular movements intact.     Conjunctiva/sclera: Conjunctivae normal.  Cardiovascular:     Rate and Rhythm: Normal rate and regular rhythm.     Heart sounds: Normal heart sounds, S1 normal and S2 normal.  Pulmonary:     Effort: Pulmonary effort is normal. No respiratory distress.     Breath sounds: Normal breath sounds and air entry.  Abdominal:     General: Abdomen is flat. Bowel sounds are normal.     Palpations: Abdomen is soft.     Tenderness: There is no abdominal tenderness. There is no right CVA tenderness, left CVA tenderness or guarding.  Musculoskeletal:     Cervical back: Neck supple.  Skin:    General: Skin is warm and dry.     Capillary Refill: Capillary refill takes less than 2 seconds.     Findings: No rash.  Neurological:     General: No focal deficit present.     Mental Status: He is alert and oriented to person, place, and time. Mental status is at baseline.     Cranial Nerves: Cranial nerves 2-12 are intact. No dysarthria or facial asymmetry.     Sensory: Sensation is intact.     Motor: Motor function is intact.     Coordination: Coordination is intact.     Gait: Gait is intact.     Comments: Strength and sensation intact to bilateral upper and lower extremities (5/5). Moves all 4 extremities with normal coordination voluntarily. Non-focal neuro exam.   Psychiatric:        Mood and Affect: Mood normal.        Speech: Speech normal.        Behavior: Behavior normal.        Thought Content: Thought content normal.        Judgment: Judgment normal.      UC Treatments / Results  Labs (all labs ordered are listed, but only abnormal results are displayed) Labs Reviewed  CBC - Abnormal; Notable for the following components:      Result Value   WBC 3.4 (*)     Platelets 100 (*)    All other components within normal limits  BASIC METABOLIC PANEL - Abnormal; Notable for the following components:   Sodium 128 (*)    Potassium 3.1 (*)    Chloride 92 (*)    Glucose, Bld 128 (*)    Creatinine, Ser 1.33 (*)    All other components within normal limits  POCT FASTING CBG [facility] MANUAL ENTRY - Abnormal; Notable for the following components:   POCT Glucose ([facility]) 111 (*)    All other components within normal limits    EKG   Radiology No results found.  Procedures Procedures (including critical care time)  Medications Ordered in UC Medications  sodium chloride 0.9 % bolus 1,000 mL (0 mLs Intravenous Stopped 12/19/22 1750)    Initial Impression / Assessment and Plan / UC Course  I have reviewed the triage vital signs and the nursing notes.  Pertinent labs & imaging results that were available during my care of the patient were reviewed by me and considered in my medical decision making (see chart for details).   1.  Acute kidney injury, hypotension due to hypovolemia, hypokalemia, dehydration, dizziness Patient with significant dehydration related to recent diarrheal illness.  Neurologically intact to baseline with nonfocal neurologic exam in clinic. Initially hypotensive in triage on arrival, this resolved after normal saline 1 L  bolus via IV. Orthostatic vital signs after IV fluid bolus are nondiagnostic. Heart rate stable throughout urgent care course. Patient reports significant improvement in generalized weakness and fatigue after fluid bolus. CBC unremarkable and with findings consistent with baseline. Basic metabolic panel findings consistent with AKI, hypokalemia at 3.1 and hyponatremia at 128. Potassium 20 mEq to be taken tonight.  Advised to come back to urgent care tomorrow to recheck labs. Imodium as needed for diarrhea. Push fluids including Pedialyte and Gatorade light.  Counseled patient on potential for adverse effects with  medications prescribed/recommended today, strict ER and return-to-clinic precautions discussed, patient verbalized understanding.    Final Clinical Impressions(s) / UC Diagnoses   Final diagnoses:  Dizziness  Dehydration  Hypokalemia  Hypotension due to hypovolemia  Acute kidney injury [facility])     Discharge Instructions      You were very dehydrated before we gave you IV fluids. We gave you IV fluids to help rehydrate you after all of the diarrhea that you have had. Take Imodium as needed for diarrhea. Continue drinking plenty of fluids to stay well-hydrated. You may drink Pedialyte as this has extra electrolytes and to help with hydration. Your potassium was a little bit low today.  Take potassium 40 mEq today. Please return to the clinic tomorrow to have your blood work rechecked to make sure that your kidney function and electrolytes have returned to normal. If your symptoms worsen or return, please go to the nearest emergency department for further workup and evaluation. Feel better!      ED Prescriptions     Medication Sig Dispense Auth. Provider   loperamide (IMODIUM) 2 MG capsule  (Status: Discontinued) Take 1 capsule (2 mg total) by mouth 4 (four) times daily as needed for diarrhea or loose stools. 12 capsule Reita May M, FNP   potassium chloride (KLOR-CON) 10 MEQ tablet  (Status: Discontinued) Take 2 tablets (20 mEq total) by mouth daily. 2 tablet Carlisle Beers, FNP   loperamide (IMODIUM) 2 MG capsule Take 1 capsule (2 mg total) by mouth 4 (four) times daily as needed for diarrhea or loose stools. 12 capsule Reita May M, FNP   potassium chloride (KLOR-CON) 10 MEQ tablet Take 2 tablets (20 mEq total) by mouth daily. 2 tablet Carlisle Beers, FNP      PDMP not reviewed this encounter.   Carlisle Beers, [place] 12/19/22 1850

## 2022-12-19 NOTE — ED Triage Notes (Signed)
Patient arrives today feeling near syncopal. Reports new dizziness and unable to maintain balance over the last three days, fell at first onset of symptoms three days ago. Reports large amounts of diarrhea over the last three days. Denies pain. Reports new weakness and feelings of syncope, SOB and cough. BP 84/63 in triage

## 2022-12-20 ENCOUNTER — Other Ambulatory Visit: Payer: Self-pay

## 2022-12-25 ENCOUNTER — Inpatient Hospital Stay (HOSPITAL_COMMUNITY): Payer: 59

## 2022-12-25 ENCOUNTER — Emergency Department (HOSPITAL_COMMUNITY): Payer: 59

## 2022-12-25 ENCOUNTER — Observation Stay (HOSPITAL_COMMUNITY)
Admission: EM | Admit: 2022-12-25 | Discharge: 2022-12-27 | Disposition: A | Discharge status: Home / Self Care | Payer: 59 | Attending: Internal Medicine | Admitting: Internal Medicine

## 2022-12-25 ENCOUNTER — Other Ambulatory Visit: Payer: Self-pay

## 2022-12-25 DIAGNOSIS — K921 Melena: Secondary | ICD-10-CM | POA: Diagnosis present

## 2022-12-25 DIAGNOSIS — D509 Iron deficiency anemia, unspecified: Secondary | ICD-10-CM | POA: Insufficient documentation

## 2022-12-25 DIAGNOSIS — K922 Gastrointestinal hemorrhage, unspecified: Principal | ICD-10-CM | POA: Diagnosis present

## 2022-12-25 DIAGNOSIS — R42 Dizziness and giddiness: Secondary | ICD-10-CM

## 2022-12-25 DIAGNOSIS — F17210 Nicotine dependence, cigarettes, uncomplicated: Secondary | ICD-10-CM | POA: Insufficient documentation

## 2022-12-25 DIAGNOSIS — D649 Anemia, unspecified: Secondary | ICD-10-CM

## 2022-12-25 DIAGNOSIS — E559 Vitamin D deficiency, unspecified: Secondary | ICD-10-CM | POA: Insufficient documentation

## 2022-12-25 DIAGNOSIS — R7989 Other specified abnormal findings of blood chemistry: Secondary | ICD-10-CM

## 2022-12-25 DIAGNOSIS — R195 Other fecal abnormalities: Secondary | ICD-10-CM | POA: Diagnosis not present

## 2022-12-25 DIAGNOSIS — I10 Essential (primary) hypertension: Secondary | ICD-10-CM | POA: Diagnosis not present

## 2022-12-25 DIAGNOSIS — E876 Hypokalemia: Secondary | ICD-10-CM | POA: Insufficient documentation

## 2022-12-25 DIAGNOSIS — Z79899 Other long term (current) drug therapy: Secondary | ICD-10-CM | POA: Insufficient documentation

## 2022-12-25 DIAGNOSIS — F1010 Alcohol abuse, uncomplicated: Secondary | ICD-10-CM | POA: Diagnosis present

## 2022-12-25 LAB — HEMOGLOBIN AND HEMATOCRIT, BLOOD
HCT: 31.7 % — ABNORMAL LOW (ref 39.0–52.0)
HCT: 31.3 % — ABNORMAL LOW (ref 39.0–52.0)
Hemoglobin: 10.1 g/dL — ABNORMAL LOW (ref 13.0–17.0)
Hemoglobin: 9.7 g/dL — ABNORMAL LOW (ref 13.0–17.0)

## 2022-12-25 LAB — BASIC METABOLIC PANEL
Anion gap: 7 (ref 5–15)
BUN: <5 mg/dL — ABNORMAL LOW (ref 6–20)
CO2: 29 mmol/L (ref 22–32)
Calcium: 8.9 mg/dL (ref 8.9–10.3)
Chloride: 99 mmol/L (ref 98–111)
Creatinine, Ser: 0.69 mg/dL (ref 0.61–1.24)
GFR, Estimated: >60 mL/min (ref >60)
Glucose, Bld: 81 mg/dL (ref 70–99)
Potassium: 3.2 mmol/L — ABNORMAL LOW (ref 3.5–5.1)
Sodium: 135 mmol/L (ref 135–145)

## 2022-12-25 LAB — VITAMIN D 25 HYDROXY (VIT D DEFICIENCY, FRACTURES): Vit D, 25-Hydroxy: 12.71 ng/mL — ABNORMAL LOW (ref 30–100)

## 2022-12-25 LAB — CBC
HCT: 35.2 % — ABNORMAL LOW (ref 39.0–52.0)
Hemoglobin: 11.1 g/dL — ABNORMAL LOW (ref 13.0–17.0)
MCH: 30.8 pg (ref 26.0–34.0)
MCHC: 31.5 g/dL (ref 30.0–36.0)
MCV: 97.8 fL (ref 80.0–100.0)
Platelets: 444 10*3/uL — ABNORMAL HIGH (ref 150–400)
RBC: 3.6 MIL/uL — ABNORMAL LOW (ref 4.22–5.81)
RDW: 12.3 % (ref 11.5–15.5)
WBC: 5.3 10*3/uL (ref 4.0–10.5)
nRBC: 0 % (ref 0.0–0.2)

## 2022-12-25 LAB — RAPID URINE DRUG SCREEN, HOSP PERFORMED
Amphetamines: NOT DETECTED
Barbiturates: NOT DETECTED
Benzodiazepines: NOT DETECTED
Cocaine: POSITIVE — AB
Opiates: NOT DETECTED
Tetrahydrocannabinol: POSITIVE — AB

## 2022-12-25 LAB — HEPATIC FUNCTION PANEL
ALT: 57 U/L — ABNORMAL HIGH (ref 0–44)
AST: 38 U/L (ref 15–41)
Albumin: 2.7 g/dL — ABNORMAL LOW (ref 3.5–5.0)
Alkaline Phosphatase: 215 U/L — ABNORMAL HIGH (ref 38–126)
Bilirubin, Direct: 0.2 mg/dL (ref 0.0–0.2)
Indirect Bilirubin: 0.4 mg/dL (ref 0.3–0.9)
Total Bilirubin: 0.6 mg/dL (ref 0.3–1.2)
Total Protein: 7.1 g/dL (ref 6.5–8.1)

## 2022-12-25 LAB — URINALYSIS, ROUTINE W REFLEX MICROSCOPIC
Bilirubin Urine: NEGATIVE
Glucose, UA: NEGATIVE mg/dL
Hgb urine dipstick: NEGATIVE
Ketones, ur: NEGATIVE mg/dL
Leukocytes,Ua: NEGATIVE
Nitrite: NEGATIVE
Protein, ur: NEGATIVE mg/dL
Specific Gravity, Urine: 1.009 (ref 1.005–1.030)
pH: 6 (ref 5.0–8.0)

## 2022-12-25 LAB — FOLATE: Folate: 20.9 ng/mL (ref >5.9)

## 2022-12-25 LAB — VITAMIN B12: Vitamin B-12: 1010 pg/mL — ABNORMAL HIGH (ref 180–914)

## 2022-12-25 LAB — PHOSPHORUS: Phosphorus: 4.1 mg/dL (ref 2.5–4.6)

## 2022-12-25 LAB — IRON AND TIBC
Iron: 41 ug/dL — ABNORMAL LOW (ref 45–182)
Saturation Ratios: 35 % (ref 17.9–39.5)
TIBC: 116 ug/dL — ABNORMAL LOW (ref 250–450)
UIBC: 75 ug/dL

## 2022-12-25 LAB — CBG MONITORING, ED
Glucose-Capillary: 78 mg/dL (ref 70–99)
Glucose-Capillary: 61 mg/dL — ABNORMAL LOW (ref 70–99)

## 2022-12-25 LAB — APTT: aPTT: 28 s (ref 24–36)

## 2022-12-25 LAB — TYPE AND SCREEN
ABO/RH(D): A POS
Antibody Screen: NEGATIVE

## 2022-12-25 LAB — ETHANOL: Alcohol, Ethyl (B): <10 mg/dL (ref <10)

## 2022-12-25 LAB — PROTIME-INR
INR: 1 (ref 0.8–1.2)
Prothrombin Time: 13.4 s (ref 11.4–15.2)

## 2022-12-25 LAB — HEPATITIS PANEL, ACUTE
HCV Ab: NONREACTIVE
Hep A IgM: NONREACTIVE
Hep B C IgM: NONREACTIVE
Hepatitis B Surface Ag: NONREACTIVE

## 2022-12-25 LAB — POC OCCULT BLOOD, ED: Fecal Occult Bld: POSITIVE — AB

## 2022-12-25 LAB — HIV ANTIBODY (ROUTINE TESTING W REFLEX): HIV Screen 4th Generation wRfx: NONREACTIVE

## 2022-12-25 LAB — MAGNESIUM: Magnesium: 1.8 mg/dL (ref 1.7–2.4)

## 2022-12-25 MED ORDER — POTASSIUM CHLORIDE 10 MEQ/100ML IV SOLN
10.0000 meq | Freq: Once | INTRAVENOUS | Status: AC
  Administered 2022-12-25: 10 meq via INTRAVENOUS
  Filled 2022-12-25: qty 100

## 2022-12-25 MED ORDER — FOLIC ACID 1 MG PO TABS
1.0000 mg | ORAL_TABLET | Freq: Every day | ORAL | Status: DC
  Administered 2022-12-25 – 2022-12-27 (×2): 1 mg via ORAL
  Filled 2022-12-25 (×2): qty 1

## 2022-12-25 MED ORDER — ONDANSETRON HCL 4 MG/2ML IJ SOLN: 4.0000 mg | Freq: Four times a day (QID) | INTRAMUSCULAR | Status: DC | PRN

## 2022-12-25 MED ORDER — SODIUM CHLORIDE 0.9% FLUSH: 3.0000 mL | INTRAVENOUS | Status: DC | PRN

## 2022-12-25 MED ORDER — SODIUM CHLORIDE 0.9 % IV SOLN: INTRAVENOUS | Status: DC

## 2022-12-25 MED ORDER — DEXTROSE 50 % IV SOLN
1.0000 | Freq: Once | INTRAVENOUS | Status: AC
  Administered 2022-12-25: 50 mL via INTRAVENOUS
  Filled 2022-12-25: qty 50

## 2022-12-25 MED ORDER — INFLUENZA VIRUS VACC SPLIT PF (FLUZONE) 0.5 ML IM SUSY
0.5000 mL | PREFILLED_SYRINGE | INTRAMUSCULAR | Status: DC
  Filled 2022-12-25: qty 0.5

## 2022-12-25 MED ORDER — ADULT MULTIVITAMIN W/MINERALS CH
1.0000 | ORAL_TABLET | Freq: Every day | ORAL | Status: DC
  Administered 2022-12-25 – 2022-12-27 (×2): 1 via ORAL
  Filled 2022-12-25 (×2): qty 1

## 2022-12-25 MED ORDER — LORAZEPAM 2 MG/ML IJ SOLN: 1.0000 mg | INTRAMUSCULAR | Status: DC | PRN

## 2022-12-25 MED ORDER — ONDANSETRON HCL 4 MG PO TABS: 4.0000 mg | ORAL_TABLET | Freq: Four times a day (QID) | ORAL | Status: DC | PRN

## 2022-12-25 MED ORDER — ACETAMINOPHEN 325 MG PO TABS: 650.0000 mg | ORAL_TABLET | Freq: Four times a day (QID) | ORAL | Status: DC | PRN

## 2022-12-25 MED ORDER — ACETAMINOPHEN 650 MG RE SUPP: 650.0000 mg | Freq: Four times a day (QID) | RECTAL | Status: DC | PRN

## 2022-12-25 MED ORDER — PANTOPRAZOLE 80MG IVPB - SIMPLE MED
80.0000 mg | Freq: Once | INTRAVENOUS | Status: AC
  Administered 2022-12-25: 80 mg via INTRAVENOUS
  Filled 2022-12-25: qty 100

## 2022-12-25 MED ORDER — VITAMIN D (ERGOCALCIFEROL) 1.25 MG (50000 UNIT) PO CAPS
50000.0000 [IU] | ORAL_CAPSULE | ORAL | Status: DC
  Filled 2022-12-25: qty 1

## 2022-12-25 MED ORDER — THIAMINE MONONITRATE 100 MG PO TABS
100.0000 mg | ORAL_TABLET | Freq: Every day | ORAL | Status: DC
  Administered 2022-12-25 – 2022-12-27 (×2): 100 mg via ORAL
  Filled 2022-12-25 (×2): qty 1

## 2022-12-25 MED ORDER — SODIUM CHLORIDE 0.9 % IV SOLN: 250.0000 mL | INTRAVENOUS | Status: DC | PRN

## 2022-12-25 MED ORDER — HYDRALAZINE HCL 20 MG/ML IJ SOLN: 10.0000 mg | Freq: Four times a day (QID) | INTRAMUSCULAR | Status: DC | PRN

## 2022-12-25 MED ORDER — POTASSIUM CHLORIDE CRYS ER 20 MEQ PO TBCR
40.0000 meq | EXTENDED_RELEASE_TABLET | ORAL | Status: AC
  Administered 2022-12-25 (×2): 40 meq via ORAL
  Filled 2022-12-25 (×2): qty 2

## 2022-12-25 MED ORDER — HYDRALAZINE HCL 50 MG PO TABS: 50.0000 mg | ORAL_TABLET | Freq: Four times a day (QID) | ORAL | Status: DC | PRN

## 2022-12-25 MED ORDER — SODIUM CHLORIDE 0.9% FLUSH: 3.0000 mL | Freq: Two times a day (BID) | INTRAVENOUS | Status: DC

## 2022-12-25 MED ORDER — LORAZEPAM 1 MG PO TABS: 1.0000 mg | ORAL_TABLET | ORAL | Status: DC | PRN

## 2022-12-25 MED ORDER — SODIUM CHLORIDE 0.9 % IV BOLUS
1000.0000 mL | Freq: Once | INTRAVENOUS | Status: AC
  Administered 2022-12-25: 1000 mL via INTRAVENOUS

## 2022-12-25 MED ORDER — ENSURE ENLIVE PO LIQD
237.0000 mL | Freq: Three times a day (TID) | ORAL | Status: DC
  Administered 2022-12-25 – 2022-12-27 (×4): 237 mL via ORAL
  Filled 2022-12-25: qty 237

## 2022-12-25 MED ORDER — PANTOPRAZOLE INFUSION (NEW) - SIMPLE MED
8.0000 mg/h | INTRAVENOUS | Status: DC
  Administered 2022-12-25: 8 mg/h via INTRAVENOUS
  Filled 2022-12-25 (×2): qty 100

## 2022-12-25 NOTE — ED Notes (Signed)
MD notified about CBG of 61

## 2022-12-25 NOTE — ED Triage Notes (Signed)
Patient reports intermittent dizziness x 1 week. States two to three episodes of dizziness per day, each lasting about 15 seconds.

## 2022-12-25 NOTE — Consult Note (Addendum)
Consultation  Referring Provider: ERMD/[facility]/[place] Primary Care Physician:  [name], NP Primary Gastroenterologist:  [name]  Reason for Consultation: Dizziness, orthostasis, drop in hemoglobin  HPI: Robert Carney is a 57 y.o. male, known to [name] from screening colonoscopy that was done in April 2024.  This revealed a few left-sided diverticuli, internal hemorrhoids and no polyps. Patient has history of a cervical myelopathy, and hypertension as well as EtOH use disorder. He presented to the emergency room earlier today with complaints of lightheadedness and dizziness.  He had also been seen at urgent care on 12/19/2022 with similar complaints of lightheadedness and dizziness.  At that time he was mildly hypotensive, was given IV fluids, noted to have hemoglobin of 14/hematocrit 45.2/platelets of 100,000 Sodium 128/potassium 3.1/BUN 10/creatinine 1.33  On presentation to the ER today he says that he has been having episodes of lightheadedness/dizziness that have been occurring throughout the day over the past couple of weeks.  These episodes last for 20 seconds or so.  He says he feels like he might pass out, his legs and arms get weak and he has to sit down quickly and then symptoms resolved.  This has not been associated with any chest pain or shortness of breath, no diaphoresis.  He has been feeling fatigued as well. He says he has still been taking his blood pressure medications, he denies any aspirin or NSAIDs.  He usually drinks 3-4 beers every day but says he has not been drinking over the past couple of weeks as he has not been feeling well. Appetite has been up and down he thinks he has lost about 12 pounds over the past couple of months no nausea or vomiting, no dysphagia or odynophagia no abdominal pain. He did have diarrhea at onset of this illness and on careful questioning he says the stool was dark and somewhat black around the edges in the commode during this  time.  He is back to having normal bowel movements now has not seen any bright red blood or any black appearing stool this week.  Noted to be orthostatic today, has been receiving IV fluids. Labs show WBC of 5.3/hemoglobin 11.1/hematocrit 35.2/MCV 97/platelets 444 Potassium 3.2/BUN 5/creatinine 0.69 T. bili 0.6/alk phos 215/ALT 57 Albumin 2.7 INR 1.0/pro time 13.4 Stool noted heme positive Chest x-ray no acute disease EKG normal sinus rhythm there were some ST and T wave changes, per ER MD no changes compared to last EKG.     Past Medical History:  Diagnosis Date   Cervical myelopathy (HCC) 11/01/2016   Gait abnormality 11/01/2016   Hypertension    no longer taking meds    Past Surgical History:  Procedure Laterality Date   ANTERIOR CERVICAL CORPECTOMY N/A 03/26/2016   Procedure: Cervical  Seven Anterior cervical corpectomy;  Surgeon: [name], MD;  Location: [facility] OR;  Service: Neurosurgery;  Laterality: N/A;  Cervical  Seven Anterior cervical corpectomy   tooth pulled      Prior to Admission medications   Medication Sig Start Date End Date Taking? Authorizing Provider  amLODipine (NORVASC) 10 MG tablet TAKE 1 TABLET (10 MG TOTAL) BY MOUTH DAILY. 04/18/22 04/18/23 Yes [name], NP  Cyanocobalamin (VITAMIN B 12 PO) Take 1 tablet by mouth daily.   Yes [provider]  hydrochlorothiazide (HYDRODIURIL) 25 MG tablet TAKE 1 TABLET (25 MG TOTAL) BY MOUTH DAILY. 04/18/22  Yes [name], NP  loperamide (IMODIUM) 2 MG capsule Take 1 capsule (2 mg  total) by mouth 4 (four) times daily as needed for diarrhea or loose stools. 12/19/22  Yes Carlisle Beers, FNP  metoprolol tartrate (LOPRESSOR) 25 MG tablet Take 0.5 tablets (12.5 mg total) by mouth 2 (two) times daily. Patient taking differently: Take 25 mg by mouth 2 (two) times daily. 04/18/22  Yes [name], NP  potassium chloride (KLOR-CON) 10 MEQ tablet Take 2 tablets (20 mEq total) by mouth  daily. 12/19/22  Yes Carlisle Beers, FNP    Current Facility-Administered Medications  Medication Dose Route Frequency Provider Last Rate Last Admin   0.9 %  sodium chloride infusion  250 mL Intravenous PRN Gillis Santa, MD       0.9 %  sodium chloride infusion   Intravenous Continuous Gillis Santa, MD       acetaminophen (TYLENOL) tablet 650 mg  650 mg Oral Q6H PRN Gillis Santa, MD       Or   acetaminophen (TYLENOL) suppository 650 mg  650 mg Rectal Q6H PRN Gillis Santa, MD       folic acid (FOLVITE) tablet 1 mg  1 mg Oral Daily Gillis Santa, MD       hydrALAZINE (APRESOLINE) injection 10 mg  10 mg Intravenous Q6H PRN Gillis Santa, MD       hydrALAZINE (APRESOLINE) tablet 50 mg  50 mg Oral Q6H PRN Gillis Santa, MD       LORazepam (ATIVAN) tablet 1-4 mg  1-4 mg Oral Q1H PRN Gillis Santa, MD       Or   LORazepam (ATIVAN) injection 1-4 mg  1-4 mg Intravenous Q1H PRN Gillis Santa, MD       multivitamin with minerals tablet 1 tablet  1 tablet Oral Daily Gillis Santa, MD       ondansetron [facility]) tablet 4 mg  4 mg Oral Q6H PRN Gillis Santa, MD       Or   ondansetron [facility]) injection 4 mg  4 mg Intravenous Q6H PRN Gillis Santa, MD       pantoprazole (PROTONIX) 80 mg /NS 100 mL IVPB  80 mg Intravenous Once Jacalyn Lefevre, MD       pantoprozole (PROTONIX) 80 mg /NS 100 mL infusion  8 mg/hr Intravenous Continuous Jacalyn Lefevre, MD       potassium chloride 10 mEq in 100 mL IVPB  10 mEq Intravenous Once Jacalyn Lefevre, MD 100 mL/hr at 12/25/22 1238 10 mEq at 12/25/22 1238   potassium chloride SA (KLOR-CON M) CR tablet 40 mEq  40 mEq Oral Q4H Gillis Santa, MD   40 mEq at 12/25/22 1250   sodium chloride flush (NS) 0.9 % injection 3 mL  3 mL Intravenous Q12H Gillis Santa, MD       sodium chloride flush (NS) 0.9 % injection 3 mL  3 mL Intravenous PRN Gillis Santa, MD       thiamine (VITAMIN B1) tablet 100 mg  100 mg Oral Daily Gillis Santa, MD       Current Outpatient  Medications  Medication Sig Dispense Refill   amLODipine (NORVASC) 10 MG tablet TAKE 1 TABLET (10 MG TOTAL) BY MOUTH DAILY. 90 tablet 1   Cyanocobalamin (VITAMIN B 12 PO) Take 1 tablet by mouth daily.     hydrochlorothiazide (HYDRODIURIL) 25 MG tablet TAKE 1 TABLET (25 MG TOTAL) BY MOUTH DAILY. 90 tablet 1   loperamide (IMODIUM) 2 MG capsule Take 1 capsule (2 mg total) by mouth 4 (four) times daily as needed for diarrhea or loose  stools. 12 capsule 0   metoprolol tartrate (LOPRESSOR) 25 MG tablet Take 0.5 tablets (12.5 mg total) by mouth 2 (two) times daily. (Patient taking differently: Take 25 mg by mouth 2 (two) times daily.) 180 tablet 1   potassium chloride (KLOR-CON) 10 MEQ tablet Take 2 tablets (20 mEq total) by mouth daily. 2 tablet 0    Allergies as of 12/25/2022 - Review Complete 12/25/2022  Allergen Reaction Noted   No known allergies  03/25/2016    Family History  Problem Relation Age of Onset   Diabetes Mother    Lung cancer Mother    Hypertension Mother    Prostate cancer Father    Hypertension Father    Breast cancer Sister    Breast cancer Maternal Aunt    Colon cancer Neg Hx    Colon polyps Neg Hx    Esophageal cancer Neg Hx    Rectal cancer Neg Hx    Stomach cancer Neg Hx     Social History   Socioeconomic History   Marital status: Single    Spouse name: Not on file   Number of children: 4   Years of education: 12   Highest education level: Not on file  Occupational History   Occupation: [employer]   Tobacco Use   Smoking status: Some Days    Current packs/day: 0.20    Types: Cigarettes   Smokeless tobacco: Never  Vaping Use   Vaping status: Never Used  Substance and Sexual Activity   Alcohol use: Yes    Alcohol/week: 42.0 standard drinks of alcohol    Types: 42 Cans of beer per week    Comment: Daily beer drinker   Drug use: Yes    Types: Marijuana    Comment: No history of IV drug use.   Sexual activity: Not on file  Other  Topics Concern   Not on file  Social History Narrative   Lives with mother, Kathie Rhodes   Caffeine use: sometimes   Right handed   Social Determinants of Health   Financial Resource Strain: Not on file  Food Insecurity: Not on file  Transportation Needs: Not on file  Physical Activity: Not on file  Stress: Not on file  Social Connections: Not on file  Intimate Partner Violence: Not on file    Review of Systems: Pertinent positive and negative review of systems were noted in the above HPI section.  All other review of systems was otherwise negative.   Physical Exam: Vital signs in last 24 hours: Temp:  [97.7 F (36.5 C)] 97.7 F (36.5 C) (10/08 0942) Pulse Rate:  [60-62] 62 (10/08 1200) Resp:  [14-17] 17 (10/08 1200) BP: (87-118)/(60-85) 118/85 (10/08 1200) SpO2:  [100 %] 100 % (10/08 1201)   General:   Alert,  Well-developed, thin older African-American male ,pleasant and cooperative in NAD Head:  Normocephalic and atraumatic. Eyes:  Sclera clear, no icterus.   Conjunctiva pink. Ears:  Normal auditory acuity. Nose:  No deformity, discharge,  or lesions. Mouth:  No deformity or lesions.   Neck:  Supple; no masses or thyromegaly. Lungs:  Clear throughout to auscultation.   No wheezes, crackles, or rhonchi.  Heart:  Regular rate and rhythm; no murmurs, clicks, rubs,  or gallops. Abdomen:  Soft,nontender, BS active,nonpalp mass or hsm.   Rectal: Not done-stool heme positive/fecal occult blood Msk:  Symmetrical without gross deformities. . Pulses:  Normal pulses noted. Extremities:  Without clubbing or edema. Neurologic:  Alert and  oriented  x4;  grossly normal neurologically. Skin:  Intact without significant lesions or rashes.. Psych:  Alert and cooperative. Normal mood and affect.  Intake/Output from previous day: No intake/output data recorded. Intake/Output this shift: No intake/output data recorded.  Lab Results: Recent Labs    12/25/22 0952  WBC 5.3  HGB 11.1*   HCT 35.2*  PLT 444*   BMET Recent Labs    12/25/22 0952  NA 135  K 3.2*  CL 99  CO2 29  GLUCOSE 81  BUN <5*  CREATININE 0.69  CALCIUM 8.9   LFT Recent Labs    12/25/22 1131  PROT 7.1  ALBUMIN 2.7*  AST 38  ALT 57*  ALKPHOS 215*  BILITOT 0.6  BILIDIR 0.2  IBILI 0.4   PT/INR Recent Labs    12/25/22 1131  LABPROT 13.4  INR 1.0      IMPRESSION:  #14  57 year old African-American male presenting to the ER with complaints of lightheadedness and dizziness onset about 2 weeks ago and persisting.  He describes episodes of dizziness lasting about 20 seconds with sensation of presyncope relieved by sitting down.  He was seen in urgent care on 12/19/2022 with similar complaints, was mildly hypotensive at that time was treated with IV fluids, and released.  Labs at that time with hemoglobin of 14.1/hematocrit 45.2.  Today he is orthostatic, and labs show hemoglobin of 11.1/hematocrit 35.2-hemoglobin down about 3 g over the past 6 days. He is documented heme positive. He says that he had very dark diarrhea about 1/2 to 2 weeks ago that lasted for few days then resolved.  Has not noted any melena or hematochezia since.  He has had a weight loss of about 12 pounds over the past month or so.  This is in the setting of chronic EtOH use 3-4 beers per day but not actively drinking over the past couple of weeks.  Etiology of his constellation of symptoms is not entirely clear though suspicious for recent GI bleeding likely occurring with the dark diarrhea a couple of weeks ago  He had colonoscopy earlier this year revealing for internal hemorrhoids and a few left-sided diverticuli. Consider EtOH related gastritis, rule out peptic ulcer disease, rule out occult neoplasm.  Has not had any evidence of cirrhosis, in the past and no current lab criteria concerning for cirrhosis.  #2 anemia acute normocytic #3 history of hypertension #4 cervical myelopathy #5 elevated LFTs and alk phos  and ALT-not consistent with EtOH   PLAN: Regular diet today, n.p.o. after midnight Hemoglobin now, then every 8 hours-transfuse as indicated IV PPI has been started Patient will be scheduled for EGD with [name] tomorrow 12/26/2022 in AM.  Procedure was discussed in detail with the patient including indications risk and benefits and he is agreeable to proceed. Check abdominal ultrasound Hepatic panel in a.m. hepatitis serologies, 5 nucleotidase GI will follow with you    Amy EsterwoodPA-C  12/25/2022, 1:13 PM  I have taken an interval history, thoroughly reviewed the chart and examined the patient. I agree with the Advanced Practitioner's note, impression and recommendations, and have recorded additional findings, impressions and recommendations below. I performed a substantive portion of this encounter (>50% time spent), including a complete performance of the medical decision making.  My additional thoughts are as follows:  57 year old man known to me from a screening colonoscopy earlier this year.  He is here now with persistent episodic lightheadedness and orthostasis of unclear cause.  Heme positive stool and anemic, though not  clear to what extent one is causing the other. Also not clear if this anemia is sufficient to explain the above symptoms that brought him in today.  Incidentally noted alkaline phosphatase elevation with ALT of 57 and otherwise normal LFTs.  Not typical for alcohol effect, and he admits to several beers per day.  Abdominal ultrasound pending to assess for any hepatobiliary abnormalities such as biliary duct dilatation, choledocholithiasis (though no pain in those areas), or liver mass. Repeating LFTs tomorrow along with GGT and 5 prime nucleotidase to see if the alkaline phosphatase is of biliary origin.  Not clear if the dark stool that he had a week or more ago was GI bleeding, but plans are for an upper endoscopy tomorrow given those reports, the anemia  and heme positive stool.  He was agreeable after discussion of procedure and risks.  The benefits and risks of the planned procedure were described in detail with the patient or (when appropriate) their health care proxy.  Risks were outlined as including, but not limited to, bleeding, infection, perforation, adverse medication reaction leading to cardiac or pulmonary decompensation, pancreatitis (if ERCP).  The limitation of incomplete mucosal visualization was also discussed.  No guarantees or warranties were given.    Robert Carney [phone]

## 2022-12-25 NOTE — ED Provider Notes (Signed)
[place] EMERGENCY DEPARTMENT AT [facility] Provider Note   CSN: [id number] Arrival date & time: 12/25/22  1478     History  Chief Complaint  Patient presents with   Dizziness    DEAVEN Carney is a 57 y.o. male.  Pt is a 57 yo male with pmhx significant for htn, cervical myelopathy, and gait abnormality.  Pt said he had some diarrhea last week and has been feeling dizzy since then.  He said it is intermittent.  It is worse when he stands up.  No dizziness now.  He was seen at [facility] on 10/3 and was given ivfs.  He felt better for a little while, but said the dizziness came back.  Pt does drink about 3-4 beers/day.  He did take his bp meds this am.       Home Medications Prior to Admission medications   Medication Sig Start Date End Date Taking? Authorizing Provider  amLODipine (NORVASC) 10 MG tablet TAKE 1 TABLET (10 MG TOTAL) BY MOUTH DAILY. 04/18/22 04/18/23 Yes [name], NP  Cyanocobalamin (VITAMIN B 12 PO) Take 1 tablet by mouth daily.   Yes [provider]  hydrochlorothiazide (HYDRODIURIL) 25 MG tablet TAKE 1 TABLET (25 MG TOTAL) BY MOUTH DAILY. 04/18/22  Yes [name], NP  loperamide (IMODIUM) 2 MG capsule Take 1 capsule (2 mg total) by mouth 4 (four) times daily as needed for diarrhea or loose stools. 12/19/22  Yes Carlisle Beers, FNP  metoprolol tartrate (LOPRESSOR) 25 MG tablet Take 0.5 tablets (12.5 mg total) by mouth 2 (two) times daily. Patient taking differently: Take 25 mg by mouth 2 (two) times daily. 04/18/22  Yes [name], NP  potassium chloride (KLOR-CON) 10 MEQ tablet Take 2 tablets (20 mEq total) by mouth daily. 12/19/22  Yes Carlisle Beers, FNP      Allergies    No known allergies    Review of Systems   Review of Systems  Neurological:  Positive for dizziness.  All other systems reviewed and are negative.   Physical Exam Updated Vital Signs BP 118/85   Pulse 62   Temp 97.7 F (36.5 C)  (Oral)   Resp 17   SpO2 100%  Physical Exam Vitals and nursing note reviewed. Exam conducted with a chaperone present.  Constitutional:      Appearance: He is underweight.  HENT:     Head: Normocephalic and atraumatic.     Right Ear: External ear normal.     Left Ear: External ear normal.     Nose: Nose normal.     Mouth/Throat:     Mouth: Mucous membranes are moist.     Pharynx: Oropharynx is clear.  Eyes:     Extraocular Movements: Extraocular movements intact.     Conjunctiva/sclera: Conjunctivae normal.     Pupils: Pupils are equal, round, and reactive to light.  Cardiovascular:     Rate and Rhythm: Normal rate and regular rhythm.     Pulses: Normal pulses.     Heart sounds: Normal heart sounds.  Pulmonary:     Effort: Pulmonary effort is normal.     Breath sounds: Normal breath sounds.  Abdominal:     General: Abdomen is flat. Bowel sounds are normal.     Palpations: Abdomen is soft.  Genitourinary:    Rectum: Guaiac result positive.  Musculoskeletal:        General: Normal range of motion.     Cervical back: Normal  range of motion and neck supple.  Skin:    General: Skin is warm.     Capillary Refill: Capillary refill takes less than 2 seconds.  Neurological:     General: No focal deficit present.     Mental Status: He is alert and oriented to person, place, and time.  Psychiatric:        Mood and Affect: Mood normal.        Behavior: Behavior normal.     ED Results / Procedures / Treatments   Labs (all labs ordered are listed, but only abnormal results are displayed) Labs Reviewed  BASIC METABOLIC PANEL - Abnormal; Notable for the following components:      Result Value   Potassium 3.2 (*)    BUN <5 (*)    All other components within normal limits  CBC - Abnormal; Notable for the following components:   RBC 3.60 (*)    Hemoglobin 11.1 (*)    HCT 35.2 (*)    Platelets 444 (*)    All other components within normal limits  HEPATIC FUNCTION PANEL -  Abnormal; Notable for the following components:   Albumin 2.7 (*)    ALT 57 (*)    Alkaline Phosphatase 215 (*)    All other components within normal limits  POC OCCULT BLOOD, ED - Abnormal; Notable for the following components:   Fecal Occult Bld POSITIVE (*)    All other components within normal limits  CBG MONITORING, ED - Abnormal; Notable for the following components:   Glucose-Capillary 61 (*)    All other components within normal limits  MAGNESIUM  ETHANOL  PROTIME-INR  URINALYSIS, ROUTINE W REFLEX MICROSCOPIC  PHOSPHORUS  HIV ANTIBODY (ROUTINE TESTING W REFLEX)  APTT  CBG MONITORING, ED  TYPE AND SCREEN    EKG EKG Interpretation Date/Time:  Tuesday December 25 2022 09:46:14 EDT Ventricular Rate:  64 PR Interval:  164 QRS Duration:  86 QT Interval:  456 QTC Calculation: 470 R Axis:   -8  Text Interpretation: Normal sinus rhythm ST & T wave abnormality, consider anterior ischemia Prolonged QT Abnormal ECG When compared with ECG of 16-Oct-2018 08:41, PREVIOUS ECG IS PRESENT No significant change since last tracing Confirmed by Jacalyn Lefevre [phone]) on 12/25/2022 11:02:48 AM  Radiology DG Chest Portable 1 View  Result Date: 12/25/2022 CLINICAL DATA:  Dizziness EXAM: PORTABLE CHEST 1 VIEW COMPARISON:  X-ray 10/16/2018 FINDINGS: No consolidation, pneumothorax or effusion. No edema. Overlapping cardiac leads. Degenerative changes of the spine. Fixation hardware along the lower cervical spine. IMPRESSION: No acute cardiopulmonary disease. Electronically Signed   By: Karen Kays M.D.   On: 12/25/2022 12:39    Procedures Procedures    Medications Ordered in ED Medications  pantoprazole (PROTONIX) 80 mg /NS 100 mL IVPB (has no administration in time range)  pantoprozole (PROTONIX) 80 mg /NS 100 mL infusion (has no administration in time range)  potassium chloride 10 mEq in 100 mL IVPB (10 mEq Intravenous New Bag/Given 12/25/22 1238)  potassium chloride SA (KLOR-CON M) CR  tablet 40 mEq (has no administration in time range)  sodium chloride flush (NS) 0.9 % injection 3 mL (has no administration in time range)  sodium chloride flush (NS) 0.9 % injection 3 mL (has no administration in time range)  0.9 %  sodium chloride infusion (has no administration in time range)  0.9 %  sodium chloride infusion (has no administration in time range)  acetaminophen (TYLENOL) tablet 650 mg (has no administration in time  range)    Or  acetaminophen (TYLENOL) suppository 650 mg (has no administration in time range)  ondansetron (ZOFRAN) tablet 4 mg (has no administration in time range)    Or  ondansetron (ZOFRAN) injection 4 mg (has no administration in time range)  thiamine (VITAMIN B1) tablet 100 mg (has no administration in time range)  multivitamin with minerals tablet 1 tablet (has no administration in time range)  folic acid (FOLVITE) tablet 1 mg (has no administration in time range)  LORazepam (ATIVAN) tablet 1-4 mg (has no administration in time range)    Or  LORazepam (ATIVAN) injection 1-4 mg (has no administration in time range)  hydrALAZINE (APRESOLINE) tablet 50 mg (has no administration in time range)  hydrALAZINE (APRESOLINE) injection 10 mg (has no administration in time range)  sodium chloride 0.9 % bolus 1,000 mL (0 mLs Intravenous Stopped 12/25/22 1240)  dextrose 50 % solution 50 mL (50 mLs Intravenous Given 12/25/22 1155)    ED Course/ Medical Decision Making/ A&P                                 Medical Decision Making Amount and/or Complexity of Data Reviewed Labs: ordered. Radiology: ordered.  Risk Prescription drug management. Decision regarding hospitalization.   This patient presents to the ED for concern of dizziness, this involves an extensive number of treatment options, and is a complaint that carries with it a high risk of complications and morbidity.  The differential diagnosis includes hypotension, anemia, electrolyte abn, bpv,  cva   Co morbidities that complicate the patient evaluation   htn, cervical myelopathy, and gait abnormality   Additional history obtained:  Additional history obtained from epic chart review  Lab Tests:  I Ordered, and personally interpreted labs.  The pertinent results include:  cbc with hgb 11.1 (14.1 on 10/2), inr 1.0, mg nl at 1.8   Imaging Studies ordered:  I ordered imaging studies including cxr  I independently visualized and interpreted imaging which showed No acute cardiopulmonary disease.  I agree with the radiologist interpretation   Cardiac Monitoring:  The patient was maintained on a cardiac monitor.  I personally viewed and interpreted the cardiac monitored which showed an underlying rhythm of: nsr   Medicines ordered and prescription drug management:  I ordered medication including ivfs  for hypotension  Reevaluation of the patient after these medicines showed that the patient improved I have reviewed the patients home medicines and have made adjustments as needed  Critical Interventions:  protonix   Consultations Obtained:  I requested consultation with the gastroenterologist ([facility]),  and discussed lab and imaging findings as well as pertinent plan - they will see pt in consult Pt d/w Dr. Lucianne Muss ([facility]) for admission   Problem List / ED Course: GI bleed:  hx etoh abuse.  [name] ([facility]) did a colonoscopy, for routine colon cancer screening, in April which showed some internal hemorrhoids.  No hx EGD.. Dizziness:  likely due to orthostasis and 3 g hgb drop since the 2nd. Hypokalemia:  kcl iv given   Reevaluation:  After the interventions noted above, I reevaluated the patient and found that they have :improved   Social Determinants of Health:  Lives at home   Dispostion:  After consideration of the diagnostic results and the patients response to treatment, I feel that the patent would benefit from admission.    CRITICAL CARE Performed  by: Jacalyn Lefevre  Total critical care time: 30 minutes  Critical care time was exclusive of separately billable procedures and treating other patients.  Critical care was necessary to treat or prevent imminent or life-threatening deterioration.  Critical care was time spent personally by me on the following activities: development of treatment plan with patient and/or surrogate as well as nursing, discussions with consultants, evaluation of patient's response to treatment, examination of patient, obtaining history from patient or surrogate, ordering and performing treatments and interventions, ordering and review of laboratory studies, ordering and review of radiographic studies, pulse oximetry and re-evaluation of patient's condition.         Final Clinical Impression(s) / ED Diagnoses Final diagnoses:  Upper GI bleed  Hypokalemia    Rx / DC Orders ED Discharge Orders     None         Jacalyn Lefevre, MD 12/25/22 1248

## 2022-12-25 NOTE — H&P (Signed)
[facility] History and Physical   Patient: Robert Carney MVH:[id number]   PCP: [name], NP DOB: 1966/03/19   DOA: 12/25/2022   DOS: 12/25/2022   DOS: the patient was seen and examined on 12/25/2022  Patient coming from: The patient is coming from Home  Chief Complaint: Dizziness for 1 week  HPI: Robert Carney is a 57 y.o. male with Past medical history of hypertension, cervical myelopathy, gait abnormality, EtOH use drinks 3-4 beers per day, smokes 3 to 4 cigarettes/day, as reviewed from EMR, presented at [facility], ED with complaining of dizziness for 1 week.  Patient had diarrhea last week which has been stopped, had BM today no diarrhea.  Patient was seen at urgent care last week on 10/2, Hb was 14, today hemoglobin 11.1, FOBT positive.  Patient denies any dark stool or GI bleeding.  Patient was noticed to have low blood pressure and feeling dizzy/lightheadedness.  Denied any abdominal pain, no nausea vomiting, no chest pain or palpitation, no nasal complaints. ED physician discussed with GI for possible GI bleeding, started PPI IV infusion, [facility] consulted for admission and further management as below.   ED Course: VS afebrile HR 62 RR 14 BP 93/63, repeat BP 87/60, 100% on room air FOBT positive, coags within normal range Hypokalemia potassium 3.2, Hb 1.1 CXR No acute cardiopulmonary disease.  EKG sinus rhythm, prolonged QTc 470   Review of Systems: as mentioned in the history of present illness.  All other systems reviewed and are negative.  Past Medical History:  Diagnosis Date   Cervical myelopathy (HCC) 11/01/2016   Gait abnormality 11/01/2016   Hypertension    no longer taking meds   Past Surgical History:  Procedure Laterality Date   ANTERIOR CERVICAL CORPECTOMY N/A 03/26/2016   Procedure: Cervical  Seven Anterior cervical corpectomy;  Surgeon: [name], MD;  Location: [facility] OR;  Service: Neurosurgery;  Laterality: N/A;  Cervical  Seven  Anterior cervical corpectomy   tooth pulled     Social History:  reports that he has been smoking cigarettes. He has never used smokeless tobacco. He reports current alcohol use of about 42.0 standard drinks of alcohol per week. He reports current drug use. Drug: Marijuana.  Allergies  Allergen Reactions   No Known Allergies     Family history reviewed and not pertinent Family History  Problem Relation Age of Onset   Diabetes Mother    Lung cancer Mother    Hypertension Mother    Prostate cancer Father    Hypertension Father    Breast cancer Sister    Breast cancer Maternal Aunt    Colon cancer Neg Hx    Colon polyps Neg Hx    Esophageal cancer Neg Hx    Rectal cancer Neg Hx    Stomach cancer Neg Hx      Prior to Admission medications   Medication Sig Start Date End Date Taking? Authorizing Provider  amLODipine (NORVASC) 10 MG tablet TAKE 1 TABLET (10 MG TOTAL) BY MOUTH DAILY. 04/18/22 04/18/23 Yes [name], NP  Cyanocobalamin (VITAMIN B 12 PO) Take 1 tablet by mouth daily.   Yes [provider]  hydrochlorothiazide (HYDRODIURIL) 25 MG tablet TAKE 1 TABLET (25 MG TOTAL) BY MOUTH DAILY. 04/18/22  Yes [name], NP  loperamide (IMODIUM) 2 MG capsule Take 1 capsule (2 mg total) by mouth 4 (four) times daily as needed for diarrhea or loose stools. 12/19/22  Yes Carlisle Beers, FNP  metoprolol tartrate (LOPRESSOR) 25 MG tablet Take 0.5 tablets (12.5 mg total) by mouth 2 (two) times daily. Patient taking differently: Take 25 mg by mouth 2 (two) times daily. 04/18/22  Yes [name], NP  potassium chloride (KLOR-CON) 10 MEQ tablet Take 2 tablets (20 mEq total) by mouth daily. 12/19/22  Yes Carlisle Beers, FNP    Physical Exam: Vitals:   12/25/22 0944 12/25/22 1100 12/25/22 1200 12/25/22 1201  BP: (!) 87/60 116/75 118/85   Pulse: 60  62   Resp:   17   Temp:      TempSrc:      SpO2:   100% 100%    General: alert and oriented to  time, place, and person. Appear in mild distress, affect appropriate Eyes: PERRLA, Conjunctiva normal ENT: Oral Mucosa Clear, moist  Neck: no JVD, no Abnormal Mass Or lumps Cardiovascular: S1 and S2 Present, no Murmur, peripheral pulses symmetrical Respiratory: good respiratory effort, Bilateral Air entry equal and Decreased, no signs of accessory muscle use, Clear to Auscultation, no Crackles, no wheezes Abdomen: Bowel Sound present, Soft and no tenderness, no hernia Skin: no rashes  Extremities: no Pedal edema, no calf tenderness Neurologic: without any new focal findings Gait not checked due to patient safety concerns  Data Reviewed: I have personally reviewed and interpreted labs, imaging as discussed below.  CBC: Recent Labs  Lab 12/19/22 1542 12/25/22 0952  WBC 3.4* 5.3  HGB 14.1 11.1*  HCT 45.2 35.2*  MCV 95.8 97.8  PLT 100* 444*   Basic Metabolic Panel: Recent Labs  Lab 12/19/22 1542 12/25/22 0952 12/25/22 1131  NA 128* 135  --   K 3.1* 3.2*  --   CL 92* 99  --   CO2 22 29  --   GLUCOSE 128* 81  --   BUN 10 <5*  --   CREATININE 1.33* 0.69  --   CALCIUM 9.0 8.9  --   MG  --   --  1.8   GFR: CrCl cannot be calculated (Unknown ideal weight.). Liver Function Tests: Recent Labs  Lab 12/25/22 1131  AST 38  ALT 57*  ALKPHOS 215*  BILITOT 0.6  PROT 7.1  ALBUMIN 2.7*   No results for input(s): "LIPASE", "AMYLASE" in the last 168 hours. No results for input(s): "AMMONIA" in the last 168 hours. Coagulation Profile: Recent Labs  Lab 12/25/22 1131  INR 1.0   Cardiac Enzymes: No results for input(s): "CKTOTAL", "CKMB", "CKMBINDEX", "TROPONINI" in the last 168 hours. BNP (last 3 results) No results for input(s): "PROBNP" in the last 8760 hours. HbA1C: No results for input(s): "HGBA1C" in the last 72 hours. CBG: Recent Labs  Lab 12/25/22 1006 12/25/22 1124  GLUCAP 78 61*   Lipid Profile: No results for input(s): "CHOL", "HDL", "LDLCALC", "TRIG",  "CHOLHDL", "LDLDIRECT" in the last 72 hours. Thyroid Function Tests: No results for input(s): "TSH", "T4TOTAL", "FREET4", "T3FREE", "THYROIDAB" in the last 72 hours. Anemia Panel: No results for input(s): "VITAMINB12", "FOLATE", "FERRITIN", "TIBC", "IRON", "RETICCTPCT" in the last 72 hours. Urine analysis:    Component Value Date/Time   COLORURINE YELLOW 03/21/2016 0033   APPEARANCEUR CLEAR 03/21/2016 0033   LABSPEC 1.010 03/21/2016 0033   PHURINE 5.0 03/21/2016 0033   GLUCOSEU NEGATIVE 03/21/2016 0033   HGBUR NEGATIVE 03/21/2016 0033   BILIRUBINUR NEGATIVE 03/21/2016 0033   KETONESUR NEGATIVE 03/21/2016 0033   PROTEINUR NEGATIVE 03/21/2016 0033   NITRITE NEGATIVE 03/21/2016 0033   LEUKOCYTESUR NEGATIVE 03/21/2016 0033    Radiological  Exams on Admission: DG Chest Portable 1 View  Result Date: 12/25/2022 CLINICAL DATA:  Dizziness EXAM: PORTABLE CHEST 1 VIEW COMPARISON:  X-ray 10/16/2018 FINDINGS: No consolidation, pneumothorax or effusion. No edema. Overlapping cardiac leads. Degenerative changes of the spine. Fixation hardware along the lower cervical spine. IMPRESSION: No acute cardiopulmonary disease. Electronically Signed   By: Karen Kays M.D.   On: 12/25/2022 12:39    I reviewed all nursing notes, pharmacy notes, vitals, pertinent old records.  Assessment/Plan Principal Problem:   Acute GI bleeding  # Acute GI bleeding Hb 14 on 10/2, dropped to 11.1 today FOBT positive Continue PPI IV infusion Monitor H&H and transfuse if Hb less than 7 Follow anemia workup Started clear liquid diet, keep n.p.o. after midnight Follow GI for further recommendation  # Hypertension Patient presented with hypotension, most likely due to GI bleeding Patient was symptomatic due to low blood pressure Held home meds hydrochlorothiazide, amlodipine and metoprolol Monitor BP and titrate medications accordingly Use hydralazine oral and IV as needed   # Hypokalemia, potassium  repleted. Monitor electrolytes and replete as needed.  # Hypoalbuminemia most likely due to poor nutrition Started protein supplement  # EtOH use, drinks 3-4 beers per day Started CIWA protocol Watch for withdrawal symptoms Started thiamine, folate and multivitamin Follow urine drug screen  # Nicotine dependence, patient smokes cigarettes 3 to 4/day Does not want nicotine patch.  Smoking cessation counseling done.   Nutrition: Clear liquid diet, keep n.p.o. after midnight DVT Prophylaxis: SCD, pharmacological prophylaxis contraindicated due to GI bleeding  Advance goals of care discussion: Full code   Consults: GI  Family Communication: family was present at bedside, at the time of interview.  Opportunity was given to ask question and all questions were answered satisfactorily.  Disposition: Admitted as inpatient, telemetry unit. Likely to be discharged home, in 2-3 days, when cleared by GI.  I have discussed plan of care as described above with RN and patient/family.  Severity of Illness: The appropriate patient status for this patient is INPATIENT. Inpatient status is judged to be reasonable and necessary in order to provide the required intensity of service to ensure the patient's safety. The patient's presenting symptoms, physical exam findings, and initial radiographic and laboratory data in the context of their chronic comorbidities is felt to place them at high risk for further clinical deterioration. Furthermore, it is not anticipated that the patient will be medically stable for discharge from the hospital within 2 midnights of admission.   * I certify that at the point of admission it is my clinical judgment that the patient will require inpatient hospital care spanning beyond 2 midnights from the point of admission due to high intensity of service, high risk for further deterioration and high frequency of surveillance required.*   Author: Gillis Santa, MD [facility] 12/25/2022 12:43 PM   To reach On-call, see care teams to locate the attending and reach out to them via [email or web address]. If 7PM-7AM, please contact night-coverage If you still have difficulty reaching the attending provider, please page the [facility] (Director on Call) for [facility] on amion for assistance.

## 2022-12-25 NOTE — H&P (View-Only) (Signed)
Consultation  Referring Provider: ERMD/[facility]/[place] Primary Care Physician:  [name], NP Primary Gastroenterologist:  [name]  Reason for Consultation: Dizziness, orthostasis, drop in hemoglobin  HPI: Robert Carney is a 57 y.o. male, known to [name] from screening colonoscopy that was done in April 2024.  This revealed a few left-sided diverticuli, internal hemorrhoids and no polyps. Patient has history of a cervical myelopathy, and hypertension as well as EtOH use disorder. He presented to the emergency room earlier today with complaints of lightheadedness and dizziness.  He had also been seen at urgent care on 12/19/2022 with similar complaints of lightheadedness and dizziness.  At that time he was mildly hypotensive, was given IV fluids, noted to have hemoglobin of 14/hematocrit 45.2/platelets of 100,000 Sodium 128/potassium 3.1/BUN 10/creatinine 1.33  On presentation to the ER today he says that he has been having episodes of lightheadedness/dizziness that have been occurring throughout the day over the past couple of weeks.  These episodes last for 20 seconds or so.  He says he feels like he might pass out, his legs and arms get weak and he has to sit down quickly and then symptoms resolved.  This has not been associated with any chest pain or shortness of breath, no diaphoresis.  He has been feeling fatigued as well. He says he has still been taking his blood pressure medications, he denies any aspirin or NSAIDs.  He usually drinks 3-4 beers every day but says he has not been drinking over the past couple of weeks as he has not been feeling well. Appetite has been up and down he thinks he has lost about 12 pounds over the past couple of months no nausea or vomiting, no dysphagia or odynophagia no abdominal pain. He did have diarrhea at onset of this illness and on careful questioning he says the stool was dark and somewhat black around the edges in the commode during this  time.  He is back to having normal bowel movements now has not seen any bright red blood or any black appearing stool this week.  Noted to be orthostatic today, has been receiving IV fluids. Labs show WBC of 5.3/hemoglobin 11.1/hematocrit 35.2/MCV 97/platelets 444 Potassium 3.2/BUN 5/creatinine 0.69 T. bili 0.6/alk phos 215/ALT 57 Albumin 2.7 INR 1.0/pro time 13.4 Stool noted heme positive Chest x-ray no acute disease EKG normal sinus rhythm there were some ST and T wave changes, per ER MD no changes compared to last EKG.     Past Medical History:  Diagnosis Date   Cervical myelopathy (HCC) 11/01/2016   Gait abnormality 11/01/2016   Hypertension    no longer taking meds    Past Surgical History:  Procedure Laterality Date   ANTERIOR CERVICAL CORPECTOMY N/A 03/26/2016   Procedure: Cervical  Seven Anterior cervical corpectomy;  Surgeon: [name], MD;  Location: [facility] OR;  Service: Neurosurgery;  Laterality: N/A;  Cervical  Seven Anterior cervical corpectomy   tooth pulled      Prior to Admission medications   Medication Sig Start Date End Date Taking? Authorizing Provider  amLODipine (NORVASC) 10 MG tablet TAKE 1 TABLET (10 MG TOTAL) BY MOUTH DAILY. 04/18/22 04/18/23 Yes [name], NP  Cyanocobalamin (VITAMIN B 12 PO) Take 1 tablet by mouth daily.   Yes [provider]  hydrochlorothiazide (HYDRODIURIL) 25 MG tablet TAKE 1 TABLET (25 MG TOTAL) BY MOUTH DAILY. 04/18/22  Yes [name], NP  loperamide (IMODIUM) 2 MG capsule Take 1 capsule (2 mg  total) by mouth 4 (four) times daily as needed for diarrhea or loose stools. 12/19/22  Yes Carlisle Beers, FNP  metoprolol tartrate (LOPRESSOR) 25 MG tablet Take 0.5 tablets (12.5 mg total) by mouth 2 (two) times daily. Patient taking differently: Take 25 mg by mouth 2 (two) times daily. 04/18/22  Yes [name], NP  potassium chloride (KLOR-CON) 10 MEQ tablet Take 2 tablets (20 mEq total) by mouth  daily. 12/19/22  Yes Carlisle Beers, FNP    Current Facility-Administered Medications  Medication Dose Route Frequency Provider Last Rate Last Admin   0.9 %  sodium chloride infusion  250 mL Intravenous PRN Gillis Santa, MD       0.9 %  sodium chloride infusion   Intravenous Continuous Gillis Santa, MD       acetaminophen (TYLENOL) tablet 650 mg  650 mg Oral Q6H PRN Gillis Santa, MD       Or   acetaminophen (TYLENOL) suppository 650 mg  650 mg Rectal Q6H PRN Gillis Santa, MD       folic acid (FOLVITE) tablet 1 mg  1 mg Oral Daily Gillis Santa, MD       hydrALAZINE (APRESOLINE) injection 10 mg  10 mg Intravenous Q6H PRN Gillis Santa, MD       hydrALAZINE (APRESOLINE) tablet 50 mg  50 mg Oral Q6H PRN Gillis Santa, MD       LORazepam (ATIVAN) tablet 1-4 mg  1-4 mg Oral Q1H PRN Gillis Santa, MD       Or   LORazepam (ATIVAN) injection 1-4 mg  1-4 mg Intravenous Q1H PRN Gillis Santa, MD       multivitamin with minerals tablet 1 tablet  1 tablet Oral Daily Gillis Santa, MD       ondansetron [facility]) tablet 4 mg  4 mg Oral Q6H PRN Gillis Santa, MD       Or   ondansetron [facility]) injection 4 mg  4 mg Intravenous Q6H PRN Gillis Santa, MD       pantoprazole (PROTONIX) 80 mg /NS 100 mL IVPB  80 mg Intravenous Once Jacalyn Lefevre, MD       pantoprozole (PROTONIX) 80 mg /NS 100 mL infusion  8 mg/hr Intravenous Continuous Jacalyn Lefevre, MD       potassium chloride 10 mEq in 100 mL IVPB  10 mEq Intravenous Once Jacalyn Lefevre, MD 100 mL/hr at 12/25/22 1238 10 mEq at 12/25/22 1238   potassium chloride SA (KLOR-CON M) CR tablet 40 mEq  40 mEq Oral Q4H Gillis Santa, MD   40 mEq at 12/25/22 1250   sodium chloride flush (NS) 0.9 % injection 3 mL  3 mL Intravenous Q12H Gillis Santa, MD       sodium chloride flush (NS) 0.9 % injection 3 mL  3 mL Intravenous PRN Gillis Santa, MD       thiamine (VITAMIN B1) tablet 100 mg  100 mg Oral Daily Gillis Santa, MD       Current Outpatient  Medications  Medication Sig Dispense Refill   amLODipine (NORVASC) 10 MG tablet TAKE 1 TABLET (10 MG TOTAL) BY MOUTH DAILY. 90 tablet 1   Cyanocobalamin (VITAMIN B 12 PO) Take 1 tablet by mouth daily.     hydrochlorothiazide (HYDRODIURIL) 25 MG tablet TAKE 1 TABLET (25 MG TOTAL) BY MOUTH DAILY. 90 tablet 1   loperamide (IMODIUM) 2 MG capsule Take 1 capsule (2 mg total) by mouth 4 (four) times daily as needed for diarrhea or loose  stools. 12 capsule 0   metoprolol tartrate (LOPRESSOR) 25 MG tablet Take 0.5 tablets (12.5 mg total) by mouth 2 (two) times daily. (Patient taking differently: Take 25 mg by mouth 2 (two) times daily.) 180 tablet 1   potassium chloride (KLOR-CON) 10 MEQ tablet Take 2 tablets (20 mEq total) by mouth daily. 2 tablet 0    Allergies as of 12/25/2022 - Review Complete 12/25/2022  Allergen Reaction Noted   No known allergies  03/25/2016    Family History  Problem Relation Age of Onset   Diabetes Mother    Lung cancer Mother    Hypertension Mother    Prostate cancer Father    Hypertension Father    Breast cancer Sister    Breast cancer Maternal Aunt    Colon cancer Neg Hx    Colon polyps Neg Hx    Esophageal cancer Neg Hx    Rectal cancer Neg Hx    Stomach cancer Neg Hx     Social History   Socioeconomic History   Marital status: Single    Spouse name: Not on file   Number of children: 4   Years of education: 12   Highest education level: Not on file  Occupational History   Occupation: [employer]   Tobacco Use   Smoking status: Some Days    Current packs/day: 0.20    Types: Cigarettes   Smokeless tobacco: Never  Vaping Use   Vaping status: Never Used  Substance and Sexual Activity   Alcohol use: Yes    Alcohol/week: 42.0 standard drinks of alcohol    Types: 42 Cans of beer per week    Comment: Daily beer drinker   Drug use: Yes    Types: Marijuana    Comment: No history of IV drug use.   Sexual activity: Not on file  Other  Topics Concern   Not on file  Social History Narrative   Lives with mother, Kathie Rhodes   Caffeine use: sometimes   Right handed   Social Determinants of Health   Financial Resource Strain: Not on file  Food Insecurity: Not on file  Transportation Needs: Not on file  Physical Activity: Not on file  Stress: Not on file  Social Connections: Not on file  Intimate Partner Violence: Not on file    Review of Systems: Pertinent positive and negative review of systems were noted in the above HPI section.  All other review of systems was otherwise negative.   Physical Exam: Vital signs in last 24 hours: Temp:  [97.7 F (36.5 C)] 97.7 F (36.5 C) (10/08 0942) Pulse Rate:  [60-62] 62 (10/08 1200) Resp:  [14-17] 17 (10/08 1200) BP: (87-118)/(60-85) 118/85 (10/08 1200) SpO2:  [100 %] 100 % (10/08 1201)   General:   Alert,  Well-developed, thin older African-American male ,pleasant and cooperative in NAD Head:  Normocephalic and atraumatic. Eyes:  Sclera clear, no icterus.   Conjunctiva pink. Ears:  Normal auditory acuity. Nose:  No deformity, discharge,  or lesions. Mouth:  No deformity or lesions.   Neck:  Supple; no masses or thyromegaly. Lungs:  Clear throughout to auscultation.   No wheezes, crackles, or rhonchi.  Heart:  Regular rate and rhythm; no murmurs, clicks, rubs,  or gallops. Abdomen:  Soft,nontender, BS active,nonpalp mass or hsm.   Rectal: Not done-stool heme positive/fecal occult blood Msk:  Symmetrical without gross deformities. . Pulses:  Normal pulses noted. Extremities:  Without clubbing or edema. Neurologic:  Alert and  oriented  x4;  grossly normal neurologically. Skin:  Intact without significant lesions or rashes.. Psych:  Alert and cooperative. Normal mood and affect.  Intake/Output from previous day: No intake/output data recorded. Intake/Output this shift: No intake/output data recorded.  Lab Results: Recent Labs    12/25/22 0952  WBC 5.3  HGB 11.1*   HCT 35.2*  PLT 444*   BMET Recent Labs    12/25/22 0952  NA 135  K 3.2*  CL 99  CO2 29  GLUCOSE 81  BUN <5*  CREATININE 0.69  CALCIUM 8.9   LFT Recent Labs    12/25/22 1131  PROT 7.1  ALBUMIN 2.7*  AST 38  ALT 57*  ALKPHOS 215*  BILITOT 0.6  BILIDIR 0.2  IBILI 0.4   PT/INR Recent Labs    12/25/22 1131  LABPROT 13.4  INR 1.0      IMPRESSION:  #14  57 year old African-American male presenting to the ER with complaints of lightheadedness and dizziness onset about 2 weeks ago and persisting.  He describes episodes of dizziness lasting about 20 seconds with sensation of presyncope relieved by sitting down.  He was seen in urgent care on 12/19/2022 with similar complaints, was mildly hypotensive at that time was treated with IV fluids, and released.  Labs at that time with hemoglobin of 14.1/hematocrit 45.2.  Today he is orthostatic, and labs show hemoglobin of 11.1/hematocrit 35.2-hemoglobin down about 3 g over the past 6 days. He is documented heme positive. He says that he had very dark diarrhea about 1/2 to 2 weeks ago that lasted for few days then resolved.  Has not noted any melena or hematochezia since.  He has had a weight loss of about 12 pounds over the past month or so.  This is in the setting of chronic EtOH use 3-4 beers per day but not actively drinking over the past couple of weeks.  Etiology of his constellation of symptoms is not entirely clear though suspicious for recent GI bleeding likely occurring with the dark diarrhea a couple of weeks ago  He had colonoscopy earlier this year revealing for internal hemorrhoids and a few left-sided diverticuli. Consider EtOH related gastritis, rule out peptic ulcer disease, rule out occult neoplasm.  Has not had any evidence of cirrhosis, in the past and no current lab criteria concerning for cirrhosis.  #2 anemia acute normocytic #3 history of hypertension #4 cervical myelopathy #5 elevated LFTs and alk phos  and ALT-not consistent with EtOH   PLAN: Regular diet today, n.p.o. after midnight Hemoglobin now, then every 8 hours-transfuse as indicated IV PPI has been started Patient will be scheduled for EGD with [name] tomorrow 12/26/2022 in AM.  Procedure was discussed in detail with the patient including indications risk and benefits and he is agreeable to proceed. Check abdominal ultrasound Hepatic panel in a.m. hepatitis serologies, 5 nucleotidase GI will follow with you    Amy EsterwoodPA-C  12/25/2022, 1:13 PM  I have taken an interval history, thoroughly reviewed the chart and examined the patient. I agree with the Advanced Practitioner's note, impression and recommendations, and have recorded additional findings, impressions and recommendations below. I performed a substantive portion of this encounter (>50% time spent), including a complete performance of the medical decision making.  My additional thoughts are as follows:  57 year old man known to me from a screening colonoscopy earlier this year.  He is here now with persistent episodic lightheadedness and orthostasis of unclear cause.  Heme positive stool and anemic, though not  clear to what extent one is causing the other. Also not clear if this anemia is sufficient to explain the above symptoms that brought him in today.  Incidentally noted alkaline phosphatase elevation with ALT of 57 and otherwise normal LFTs.  Not typical for alcohol effect, and he admits to several beers per day.  Abdominal ultrasound pending to assess for any hepatobiliary abnormalities such as biliary duct dilatation, choledocholithiasis (though no pain in those areas), or liver mass. Repeating LFTs tomorrow along with GGT and 5 prime nucleotidase to see if the alkaline phosphatase is of biliary origin.  Not clear if the dark stool that he had a week or more ago was GI bleeding, but plans are for an upper endoscopy tomorrow given those reports, the anemia  and heme positive stool.  He was agreeable after discussion of procedure and risks.  The benefits and risks of the planned procedure were described in detail with the patient or (when appropriate) their health care proxy.  Risks were outlined as including, but not limited to, bleeding, infection, perforation, adverse medication reaction leading to cardiac or pulmonary decompensation, pancreatitis (if ERCP).  The limitation of incomplete mucosal visualization was also discussed.  No guarantees or warranties were given.    [name] [phone]

## 2022-12-25 NOTE — ED Notes (Signed)
Pt given a gingerale

## 2022-12-26 ENCOUNTER — Encounter (HOSPITAL_COMMUNITY)
Admission: EM | Disposition: A | Discharge status: Home / Self Care | Payer: Self-pay | Source: Home / Self Care | Attending: Emergency Medicine

## 2022-12-26 ENCOUNTER — Inpatient Hospital Stay (HOSPITAL_COMMUNITY): Payer: 59 | Admitting: Certified Registered"

## 2022-12-26 ENCOUNTER — Encounter (HOSPITAL_COMMUNITY): Payer: Self-pay | Admitting: Student

## 2022-12-26 DIAGNOSIS — R7989 Other specified abnormal findings of blood chemistry: Secondary | ICD-10-CM | POA: Diagnosis not present

## 2022-12-26 DIAGNOSIS — K921 Melena: Secondary | ICD-10-CM | POA: Diagnosis not present

## 2022-12-26 DIAGNOSIS — R195 Other fecal abnormalities: Secondary | ICD-10-CM

## 2022-12-26 DIAGNOSIS — D649 Anemia, unspecified: Secondary | ICD-10-CM | POA: Diagnosis not present

## 2022-12-26 DIAGNOSIS — F17200 Nicotine dependence, unspecified, uncomplicated: Secondary | ICD-10-CM

## 2022-12-26 DIAGNOSIS — R42 Dizziness and giddiness: Secondary | ICD-10-CM

## 2022-12-26 DIAGNOSIS — K922 Gastrointestinal hemorrhage, unspecified: Secondary | ICD-10-CM | POA: Diagnosis not present

## 2022-12-26 DIAGNOSIS — E440 Moderate protein-calorie malnutrition: Secondary | ICD-10-CM

## 2022-12-26 DIAGNOSIS — D509 Iron deficiency anemia, unspecified: Secondary | ICD-10-CM | POA: Diagnosis not present

## 2022-12-26 DIAGNOSIS — F1010 Alcohol abuse, uncomplicated: Secondary | ICD-10-CM

## 2022-12-26 HISTORY — PX: ESOPHAGOGASTRODUODENOSCOPY (EGD) WITH PROPOFOL: SHX5813

## 2022-12-26 LAB — HEMOGLOBIN AND HEMATOCRIT, BLOOD
HCT: 34.4 % — ABNORMAL LOW (ref 39.0–52.0)
HCT: 31.2 % — ABNORMAL LOW (ref 39.0–52.0)
Hemoglobin: 10.8 g/dL — ABNORMAL LOW (ref 13.0–17.0)
Hemoglobin: 9.7 g/dL — ABNORMAL LOW (ref 13.0–17.0)

## 2022-12-26 LAB — NUCLEOTIDASE, 5', BLOOD: 5-Nucleotidase: 4 [IU]/L (ref 0–11)

## 2022-12-26 LAB — CBC
HCT: 31.7 % — ABNORMAL LOW (ref 39.0–52.0)
Hemoglobin: 9.9 g/dL — ABNORMAL LOW (ref 13.0–17.0)
MCH: 29.7 pg (ref 26.0–34.0)
MCHC: 31.2 g/dL (ref 30.0–36.0)
MCV: 95.2 fL (ref 80.0–100.0)
Platelets: 447 10*3/uL — ABNORMAL HIGH (ref 150–400)
RBC: 3.33 MIL/uL — ABNORMAL LOW (ref 4.22–5.81)
RDW: 12.4 % (ref 11.5–15.5)
WBC: 7.3 10*3/uL (ref 4.0–10.5)
nRBC: 0 % (ref 0.0–0.2)

## 2022-12-26 LAB — HEPATIC FUNCTION PANEL
ALT: 44 U/L (ref 0–44)
AST: 26 U/L (ref 15–41)
Albumin: 2.4 g/dL — ABNORMAL LOW (ref 3.5–5.0)
Alkaline Phosphatase: 169 U/L — ABNORMAL HIGH (ref 38–126)
Bilirubin, Direct: 0.2 mg/dL (ref 0.0–0.2)
Indirect Bilirubin: 0.1 mg/dL — ABNORMAL LOW (ref 0.3–0.9)
Total Bilirubin: 0.3 mg/dL (ref 0.3–1.2)
Total Protein: 6.6 g/dL (ref 6.5–8.1)

## 2022-12-26 LAB — MAGNESIUM: Magnesium: 1.6 mg/dL — ABNORMAL LOW (ref 1.7–2.4)

## 2022-12-26 LAB — BASIC METABOLIC PANEL
Anion gap: 12 (ref 5–15)
BUN: <5 mg/dL — ABNORMAL LOW (ref 6–20)
CO2: 25 mmol/L (ref 22–32)
Calcium: 8.9 mg/dL (ref 8.9–10.3)
Chloride: 99 mmol/L (ref 98–111)
Creatinine, Ser: 0.89 mg/dL (ref 0.61–1.24)
GFR, Estimated: >60 mL/min (ref >60)
Glucose, Bld: 93 mg/dL (ref 70–99)
Potassium: 3.9 mmol/L (ref 3.5–5.1)
Sodium: 136 mmol/L (ref 135–145)

## 2022-12-26 LAB — PHOSPHORUS: Phosphorus: 2.9 mg/dL (ref 2.5–4.6)

## 2022-12-26 SURGERY — ESOPHAGOGASTRODUODENOSCOPY (EGD) WITH PROPOFOL
Anesthesia: Monitor Anesthesia Care

## 2022-12-26 MED ORDER — PROPOFOL 10 MG/ML IV BOLUS
INTRAVENOUS | Status: DC | PRN
  Administered 2022-12-26: 30 mg via INTRAVENOUS
  Administered 2022-12-26: 20 mg via INTRAVENOUS
  Administered 2022-12-26: 80 mg via INTRAVENOUS
  Administered 2022-12-26: 20 mg via INTRAVENOUS

## 2022-12-26 MED ORDER — PHENYLEPHRINE HCL (PRESSORS) 10 MG/ML IV SOLN
INTRAVENOUS | Status: DC | PRN
Start: 2022-12-26 — End: 2022-12-26
  Administered 2022-12-26: 160 ug via INTRAVENOUS

## 2022-12-26 MED ORDER — PROPOFOL 500 MG/50ML IV EMUL
INTRAVENOUS | Status: DC | PRN
  Administered 2022-12-26: 150 ug/kg/min via INTRAVENOUS

## 2022-12-26 MED ORDER — LIDOCAINE 2% (20 MG/ML) 5 ML SYRINGE
INTRAMUSCULAR | Status: DC | PRN
  Administered 2022-12-26: 100 mg via INTRAVENOUS

## 2022-12-26 SURGICAL SUPPLY — 15 items

## 2022-12-26 NOTE — Interval H&P Note (Signed)
History and Physical Interval Note:  12/26/2022 8:50 AM  Robert Carney  has presented today for surgery, with the diagnosis of Anemia, heme positive stool,.  The various methods of treatment have been discussed with the patient and family. After consideration of risks, benefits and other options for treatment, the patient has consented to  Procedure(s): ESOPHAGOGASTRODUODENOSCOPY (EGD) WITH PROPOFOL (N/A) as a surgical intervention.  The patient's history has been reviewed, patient examined, no change in status, stable for surgery.  I have reviewed the patient's chart and labs.  Questions were answered to the patient's satisfaction.    Potassium 3.9, hemoglobin stable at 9.9 today No reported hematemesis or melena since admission.  [name]

## 2022-12-26 NOTE — Anesthesia Preprocedure Evaluation (Signed)
Anesthesia Evaluation  Patient identified by MRN, date of birth, ID band Patient awake    Reviewed: Allergy & Precautions, NPO status , Patient's Chart, lab work & pertinent test results, reviewed documented beta blocker date and time   History of Anesthesia Complications Negative for: history of anesthetic complications  Airway Mallampati: II  TM Distance: >3 FB Neck ROM: Limited    Dental no notable dental hx.    Pulmonary neg shortness of breath, neg COPD, neg recent URI, Current Smoker and Patient abstained from smoking.   breath sounds clear to auscultation       Cardiovascular hypertension, (-) angina (-) CAD, (-) Past MI, (-) Cardiac Stents and (-) CABG  Rhythm:Regular Rate:Normal     Neuro/Psych neg Seizures  Neuromuscular disease (s/p ACDF)    GI/Hepatic ,,,(+) neg Cirrhosis      GIB   Endo/Other    Renal/GU Renal disease     Musculoskeletal  (+) Arthritis ,    Abdominal   Peds  Hematology  (+) Blood dyscrasia, anemia   Anesthesia Other Findings   Reproductive/Obstetrics                             Anesthesia Physical Anesthesia Plan  ASA: 2  Anesthesia Plan: MAC   Post-op Pain Management:    Induction: Intravenous  PONV Risk Score and Plan: 1 and Ondansetron and Dexamethasone  Airway Management Planned:   Additional Equipment:   Intra-op Plan:   Post-operative Plan:   Informed Consent: I have reviewed the patients History and Physical, chart, labs and discussed the procedure including the risks, benefits and alternatives for the proposed anesthesia with the patient or authorized representative who has indicated his/her understanding and acceptance.     Dental advisory given  Plan Discussed with: CRNA  Anesthesia Plan Comments:        Anesthesia Quick Evaluation

## 2022-12-26 NOTE — Care Management CC44 (Cosign Needed)
Condition Code 44 Documentation Completed  Patient Details  Name: Robert Carney MRN: [id number] Date of Birth: November 06, 1965   Condition Code 44 given:  Yes Patient signature on Condition Code 44 notice:  Yes Documentation of 2 MD's agreement:  Yes Code 44 added to claim:  Yes    Janae Bridgeman, RN 12/26/2022, 12:27 PM

## 2022-12-26 NOTE — Transfer of Care (Signed)
Immediate Anesthesia Transfer of Care Note  Patient: Robert Carney  Procedure(s) Performed: ESOPHAGOGASTRODUODENOSCOPY (EGD) WITH PROPOFOL  Patient Location: PACU  Anesthesia Type:MAC  Level of Consciousness: awake, alert , and oriented  Airway & Oxygen Therapy: Patient Spontanous Breathing  Post-op Assessment: Report given to RN and Post -op Vital signs reviewed and stable  Post vital signs: Reviewed and stable  Last Vitals:  Vitals Value Taken Time  BP 72/53 12/26/22 0930  Temp    Pulse 73 12/26/22 0930  Resp 19 12/26/22 0930  SpO2 100 % 12/26/22 0930  Vitals shown include unfiled device data.  Last Pain:  Vitals:   12/26/22 0840  TempSrc: Temporal  PainSc: 0-No pain         Complications: No notable events documented.

## 2022-12-26 NOTE — Progress Notes (Signed)
Transition of Care [facility]) - Inpatient Brief Assessment   Patient Details  Name: Robert Carney MRN: [id number] Date of Birth: [birth year]  [facility] CM/SW Contact:    Janae Bridgeman, RN Phone Number: 12/26/2022, 12:35 PM   Clinical Narrative: Cm met with the patient at the bedside regarding TOC needs.  Patient was provided with Medicare observation notice and code 44 provided.  Patient lives at home alone and currently smokes marijuana and cigarettes occasionally.  Patient was not interested in cessation but was agreeable to resources - provided in AVS.  Patient's brother is at the bedside to provide transportation to home when patient is stable for discharge.   Transition of Care Asessment: Insurance and Status: (P) Insurance coverage has been reviewed Patient has primary care physician: (P) Yes Home environment has been reviewed: (P) From home alone Prior level of function:: (P) Independent Prior/Current Home Services: (P) No current home services Social Determinants of Health Reivew: (P) SDOH reviewed no interventions necessary Readmission risk has been reviewed: (P) Yes Transition of care needs: (P) transition of care needs identified, TOC will continue to follow

## 2022-12-26 NOTE — Anesthesia Postprocedure Evaluation (Addendum)
Anesthesia Post Note  Patient: Robert Carney  Procedure(s) Performed: ESOPHAGOGASTRODUODENOSCOPY (EGD) WITH PROPOFOL     Patient location during evaluation: PACU Anesthesia Type: MAC Level of consciousness: awake and alert Pain management: pain level controlled Vital Signs Assessment: post-procedure vital signs reviewed and stable Respiratory status: spontaneous breathing, nonlabored ventilation, respiratory function stable and patient connected to nasal cannula oxygen Cardiovascular status: stable and blood pressure returned to baseline Postop Assessment: no apparent nausea or vomiting Anesthetic complications: no   No notable events documented.  Last Vitals:  Vitals:   12/26/22 0930 12/26/22 0945  BP: (!) 87/64 138/83  Pulse: 73 77  Resp: 19 16  Temp: 36.6 C 36.6 C  SpO2: 100% 100%    Last Pain:  Vitals:   12/26/22 0930  TempSrc:   PainSc: 0-No pain                 Mariann Barter

## 2022-12-26 NOTE — Care Management Obs Status (Cosign Needed)
MEDICARE OBSERVATION STATUS NOTIFICATION   Patient Details  Name: Robert Carney MRN: [id number] Date of Birth: Aug 17, 1965   Medicare Observation Status Notification Given:  Yes    Janae Bridgeman, RN 12/26/2022, 12:27 PM

## 2022-12-26 NOTE — Progress Notes (Signed)
Progress Note   Patient: Robert Carney ZOX:[id number] DOB: May 26, 1965 DOA: 12/25/2022     1 DOS: the patient was seen and examined on 12/26/2022   Brief hospital course: Robert Carney is a 57 y.o. male with Past medical history of hypertension, cervical myelopathy, gait abnormality, EtOH use drinks 3-4 beers per day, smokes 3 to 4 cigarettes/day, as reviewed from EMR, presented at [facility], ED with complaining of dizziness for 1 week. In ED noted to have FOBT positive admitted for further management.  Assessment and Plan: Acute GI bleeding - H/H dropped to 9.7 Continue PPI GI consult appreciated. S/p EGD unremarkable finding. Start diet and advance.  Hypertension- BP lower side, held his BP meds.  Hypokalemia- Replete K. Monitor daily electrolytes.  ETOH abuse- Continue thiamine, folate and multivitamin. Watch for withdrawal symptoms.  Tobacco abuse- Smoking cessation counseled.  Moderate malnutrition- BMI 16.4 Dietician evaluation. Encourage oral diet, supplements,  Out of bed to chair. Incentive spirometry. Nursing supportive care. Fall, aspiration precautions. DVT prophylaxis   Code Status: Full Code  Subjective: Patient is seen and examined today after GI procedure. He wishes to eat. Denies any abdominal pain.  Physical Exam: Vitals:   12/26/22 0840 12/26/22 0930 12/26/22 0945 12/26/22 1633  BP: 137/85 (!) 87/64 138/83 126/80  Pulse: 67 73 77 82  Resp: 12 19 16 17   Temp: 98.1 F (36.7 C) 97.8 F (36.6 C) 97.8 F (36.6 C) 98.5 F (36.9 C)  TempSrc: Temporal   Oral  SpO2: 99% 100% 100% 100%  Weight: 54.9 kg     Height: 6' (1.829 m)       General - Middle aged thin built [place] American male, no apparent distress HEENT - PERRLA, EOMI, atraumatic head, non tender sinuses. Lung - Clear, no rales, rhonchi, wheezes. Heart - S1, S2 heard, no murmurs, rubs, no pedal edema. Abdomen - Soft, non tender, bowel sounds good Neuro - Alert, awake and oriented  x 3, non focal exam. Skin - Warm and dry.  Data Reviewed:      Latest Ref Rng & Units 12/26/2022   12:46 PM 12/26/2022    5:33 AM 12/25/2022   11:20 PM  CBC  WBC 4.0 - 10.5 K/uL  7.3    Hemoglobin 13.0 - 17.0 g/dL 81.1  9.9  9.7   Hematocrit 39.0 - 52.0 % 34.4  31.7  31.3   Platelets 150 - 400 K/uL  447        Latest Ref Rng & Units 12/26/2022    5:33 AM 12/25/2022    9:52 AM 12/19/2022    3:42 PM  BMP  Glucose 70 - 99 mg/dL 93  81  914   BUN 6 - 20 mg/dL <5  <5  10   Creatinine 0.61 - 1.24 mg/dL 7.82  9.56  2.13   Sodium 135 - 145 mmol/L 136  135  128   Potassium 3.5 - 5.1 mmol/L 3.9  3.2  3.1   Chloride 98 - 111 mmol/L 99  99  92   CO2 22 - 32 mmol/L 25  29  22    Calcium 8.9 - 10.3 mg/dL 8.9  8.9  9.0    US Abdomen Complete  Result Date: 12/25/2022 CLINICAL DATA:  Elevated liver function tests EXAM: ABDOMEN ULTRASOUND COMPLETE COMPARISON:  None Available. FINDINGS: Gallbladder: Gallbladder is not well seen. Please correlate for any known history or dedicated additional study as clinically appropriate. Common bile duct: Diameter: 4 mm Liver: No focal lesion  identified. Within normal limits in parenchymal echogenicity. Portal vein is patent on color Doppler imaging with normal direction of blood flow towards the liver. IVC: No abnormality visualized. Pancreas: Visualized portion unremarkable. Pancreatic duct measured at 3 mm. Slightly ectatic Spleen: Size and appearance within normal limits. Right Kidney: Length: 11.0 cm. Echogenicity within normal limits. No mass or hydronephrosis visualized. Left Kidney: Length: 10.0 cm. Echogenicity within normal limits. No mass or hydronephrosis visualized. Abdominal aorta: No aneurysm visualized. Other findings: None. IMPRESSION: No biliary ductal dilatation. However the gallbladder is not seen today. Please correlate with the history. There is also slight ectasia of the pancreatic duct 3 mm. With changes in the patient's history recommend further  workup such as MRI and MRCP or postcontrast CT scan as clinically appropriate Electronically Signed   By: Karen Kays M.D.   On: 12/25/2022 16:48   DG Chest Portable 1 View  Result Date: 12/25/2022 CLINICAL DATA:  Dizziness EXAM: PORTABLE CHEST 1 VIEW COMPARISON:  X-ray 10/16/2018 FINDINGS: No consolidation, pneumothorax or effusion. No edema. Overlapping cardiac leads. Degenerative changes of the spine. Fixation hardware along the lower cervical spine. IMPRESSION: No acute cardiopulmonary disease. Electronically Signed   By: Karen Kays M.D.   On: 12/25/2022 12:39     Family Communication: Discussed with patient, family they understand and agree. All questions answereed.    Disposition: Status is: Observation The patient remains OBS appropriate and will d/c before 2 midnights.  Planned Discharge Destination: Home     Time spent: 35 minutes  Author: Marcelino Duster, MD 12/26/2022 9:27 PM Secure chat 7am to 7pm For on call review [email or web address].

## 2022-12-26 NOTE — Op Note (Signed)
[facility] Patient Name: Robert Carney Procedure Date : 12/26/2022 MRN: [id number] Attending MD: Robert Carney. Robert Carney , MD, [id number] Date of Birth: 12-30-1965 CSN: [id number] Age: 57 Admit Type: Inpatient Procedure:                Upper GI endoscopy Indications:              Unexplained anemia, Heme positive stool, Melena                            ("black stool" 1-2 weeks ago) Providers:                Robert Carney Carney. Robert Neither, MD, Robert Rosebush, RN, Robert Carney, Technician Referring MD:              Medicines:                Monitored Anesthesia Care Complications:            No immediate complications. Estimated Blood Loss:     Estimated blood loss: none. Procedure:                Pre-Anesthesia Assessment:                           - Prior to the procedure, a History and Physical                            was performed, and patient medications and                            allergies were reviewed. The patient's tolerance of                            previous anesthesia was also reviewed. The risks                            and benefits of the procedure and the sedation                            options and risks were discussed with the patient.                            All questions were answered, and informed consent                            was obtained. Prior Anticoagulants: The patient has                            taken no anticoagulant or antiplatelet agents. ASA                            Grade Assessment: III - A patient with severe  systemic disease. After reviewing the risks and                            benefits, the patient was deemed in satisfactory                            condition to undergo the procedure.                           After obtaining informed consent, the endoscope was                            passed under direct vision. Throughout the                            procedure, the  patient's blood pressure, pulse, and                            oxygen saturations were monitored continuously. The                            [id number]-[id number] ([id number]) Olympus endoscope was introduced                            through the mouth, and advanced to the second part                            of duodenum. The upper GI endoscopy was                            accomplished without difficulty. The patient                            tolerated the procedure fairly well. Scope In: Scope Out: Findings:      The esophagus was normal.      The stomach was normal.      The cardia and gastric fundus were normal on retroflexion.      The examined duodenum was normal. Impression:               - Normal esophagus.                           - Normal stomach.                           - Normal examined duodenum.                           - No specimens collected. Recommendation:           - Return patient to hospital ward for ongoing care.                           - Resume regular diet.                           -  Return to my office.                           - Continue workup of patient's dizziness and anemia.                           No further GI testing planned in hospital                           Not clear to what extent the pos FOBT in ED is                            contributing to this patient's normocytic anemia.                            No bleeding sources on screening colonoscopy April                            2024.                           Inpatient GI service signing off - will arrange                            clinic follow up.                           - The findings and recommendations were discussed                            with the patient. Procedure Code(s):        --- Professional ---                           [phone], Esophagogastroduodenoscopy, flexible,                            transoral; diagnostic, including collection of                             specimen(s) by brushing or washing, when performed                            (separate procedure) Diagnosis Code(s):        --- Professional ---                           D50.9, Iron deficiency anemia, unspecified                           R19.5, Other fecal abnormalities                           K92.1, Melena (includes Hematochezia) CPT copyright 2022 [employer]. All rights reserved. The codes documented in this report are preliminary and upon coder review may  be revised to meet current compliance requirements.  Robert Carney. Robert Neither, MD 12/26/2022 9:27:55 AM This report has been signed electronically. Number of Addenda: 0

## 2022-12-26 NOTE — Plan of Care (Signed)

## 2022-12-26 NOTE — ED Notes (Signed)
ED TO INPATIENT HANDOFF REPORT  ED Nurse Name and Phone #: Kimmy Totten [phone]  S Name/Age/Gender Robert Carney 57 y.o. male Room/Bed: 039C/039C  Code Status   Code Status: Full Code  Home/SNF/Other Home Patient oriented to: self, place, time, and situation Is this baseline? Yes   Triage Complete: Triage complete  Chief Complaint Acute GI bleeding [K92.2]  Triage Note Patient reports intermittent dizziness x 1 week. States two to three episodes of dizziness per day, each lasting about 15 seconds.    Allergies Allergies  Allergen Reactions   No Known Allergies     Level of Care/Admitting Diagnosis ED Disposition     ED Disposition  Admit   Condition  --   Comment  Hospital Area: [facility] [[id number]]  Level of Care: Telemetry Medical [104]  May admit patient to [facility] or [facility] if equivalent level of care is available:: Yes  Covid Evaluation: Asymptomatic - no recent exposure (last 10 days) testing not required  Diagnosis: Acute GI bleeding [829562]  Admitting Physician: Gillis Santa [[id number]]  Attending Physician: Gillis Santa [[id number]]  Certification:: I certify this patient will need inpatient services for at least 2 midnights  Expected Medical Readiness: 12/27/2022          B Medical/Surgery History Past Medical History:  Diagnosis Date   Cervical myelopathy (HCC) 11/01/2016   Gait abnormality 11/01/2016   Hypertension    no longer taking meds   Past Surgical History:  Procedure Laterality Date   ANTERIOR CERVICAL CORPECTOMY N/A 03/26/2016   Procedure: Cervical  Seven Anterior cervical corpectomy;  Surgeon: [name], MD;  Location: [facility] OR;  Service: Neurosurgery;  Laterality: N/A;  Cervical  Seven Anterior cervical corpectomy   tooth pulled       A IV Location/Drains/Wounds Patient Lines/Drains/Airways Status     Active Line/Drains/Airways     Name Placement date Placement time Site Days   Peripheral  IV 03/26/16 Left Forearm 03/26/16  0713  Forearm  2466            Intake/Output Last 24 hours No intake or output data in the 24 hours ending 12/26/22 0313  Labs/Imaging Results for orders placed or performed during the hospital encounter of 12/25/22 (from the past 48 hour(s))  Basic metabolic panel     Status: Abnormal   Collection Time: 12/25/22  9:52 AM  Result Value Ref Range   Sodium 135 135 - 145 mmol/L   Potassium 3.2 (L) 3.5 - 5.1 mmol/L   Chloride 99 98 - 111 mmol/L   CO2 29 22 - 32 mmol/L   Glucose, Bld 81 70 - 99 mg/dL    Comment: Glucose reference range applies only to samples taken after fasting for at least 8 hours.   BUN <5 (L) 6 - 20 mg/dL   Creatinine, Ser 5.28 0.61 - 1.24 mg/dL   Calcium 8.9 8.9 - 41.3 mg/dL   GFR, Estimated >24 >40 mL/min    Comment: (NOTE) Calculated using the CKD-EPI Creatinine Equation (2021)    Anion gap 7 5 - 15    Comment: Performed at [facility], [address] [place], [place] [address]  CBC     Status: Abnormal   Collection Time: 12/25/22  9:52 AM  Result Value Ref Range   WBC 5.3 4.0 - 10.5 K/uL   RBC 3.60 (L) 4.22 - 5.81 MIL/uL   Hemoglobin 11.1 (L) 13.0 - 17.0 g/dL   HCT 53.6 (L) 64.4 -  52.0 %   MCV 97.8 80.0 - 100.0 fL   MCH 30.8 26.0 - 34.0 pg   MCHC 31.5 30.0 - 36.0 g/dL   RDW 64.4 03.4 - 74.2 %   Platelets 444 (H) 150 - 400 K/uL   nRBC 0.0 0.0 - 0.2 %    Comment: Performed at [facility], [address] [place], [place] [address]  Urinalysis, Routine w reflex microscopic -Urine, Clean Catch     Status: None   Collection Time: 12/25/22  9:52 AM  Result Value Ref Range   Color, Urine YELLOW YELLOW   APPearance CLEAR CLEAR   Specific Gravity, Urine 1.009 1.005 - 1.030   pH 6.0 5.0 - 8.0   Glucose, UA NEGATIVE NEGATIVE mg/dL   Hgb urine dipstick NEGATIVE NEGATIVE   Bilirubin Urine NEGATIVE NEGATIVE   Ketones, ur NEGATIVE NEGATIVE mg/dL   Protein, ur NEGATIVE NEGATIVE mg/dL   Nitrite NEGATIVE  NEGATIVE   Leukocytes,Ua NEGATIVE NEGATIVE    Comment: Performed at [facility], [address] [place], [place] [address]  Phosphorus     Status: None   Collection Time: 12/25/22  9:52 AM  Result Value Ref Range   Phosphorus 4.1 2.5 - 4.6 mg/dL    Comment: Performed at [facility], [address], [place], [place] [address]  APTT     Status: None   Collection Time: 12/25/22  9:52 AM  Result Value Ref Range   aPTT 28 24 - 36 seconds    Comment: Performed at [facility], [address], [place], [place] [address]  CBG monitoring, ED     Status: None   Collection Time: 12/25/22 10:06 AM  Result Value Ref Range   Glucose-Capillary 78 70 - 99 mg/dL    Comment: Glucose reference range applies only to samples taken after fasting for at least 8 hours.  POC occult blood, ED Provider will collect     Status: Abnormal   Collection Time: 12/25/22 11:11 AM  Result Value Ref Range   Fecal Occult Bld POSITIVE (A) NEGATIVE  CBG monitoring, ED     Status: Abnormal   Collection Time: 12/25/22 11:24 AM  Result Value Ref Range   Glucose-Capillary 61 (L) 70 - 99 mg/dL    Comment: Glucose reference range applies only to samples taken after fasting for at least 8 hours.  Magnesium     Status: None   Collection Time: 12/25/22 11:31 AM  Result Value Ref Range   Magnesium 1.8 1.7 - 2.4 mg/dL    Comment: Performed at [facility], [address], [place], [place] [address]  Hepatic function panel     Status: Abnormal   Collection Time: 12/25/22 11:31 AM  Result Value Ref Range   Total Protein 7.1 6.5 - 8.1 g/dL   Albumin 2.7 (L) 3.5 - 5.0 g/dL   AST 38 15 - 41 U/L   ALT 57 (H) 0 - 44 U/L   Alkaline Phosphatase 215 (H) 38 - 126 U/L   Total Bilirubin 0.6 0.3 - 1.2 mg/dL   Bilirubin, Direct 0.2 0.0 - 0.2 mg/dL   Indirect Bilirubin 0.4 0.3 - 0.9 mg/dL    Comment: Performed at [facility], [address] [place], [place] [address]  Ethanol     Status: None    Collection Time: 12/25/22 11:31 AM  Result Value Ref Range   Alcohol, Ethyl (B) <10 <10 mg/dL    Comment: (NOTE)  Lowest detectable limit for serum alcohol is 10 mg/dL.  For medical purposes only. Performed at [facility], [address] [place], [place] [address]   Protime-INR     Status: None   Collection Time: 12/25/22 11:31 AM  Result Value Ref Range   Prothrombin Time 13.4 11.4 - 15.2 seconds   INR 1.0 0.8 - 1.2    Comment: (NOTE) INR goal varies based on device and disease states. Performed at [facility], [address], [place], [place] [address]   Type and screen [facility]     Status: None   Collection Time: 12/25/22 11:31 AM  Result Value Ref Range   ABO/RH(D) A POS    Antibody Screen NEG    Sample Expiration      12/28/2022,2359 Performed at [facility], [address] [place], [place] [address]   HIV Antibody (routine testing w rflx)     Status: None   Collection Time: 12/25/22  1:30 PM  Result Value Ref Range   HIV Screen 4th Generation wRfx Non Reactive Non Reactive    Comment: Performed at [facility], [address], [place], [place] [address]  Hemoglobin and hematocrit, blood     Status: Abnormal   Collection Time: 12/25/22  1:37 PM  Result Value Ref Range   Hemoglobin 10.1 (L) 13.0 - 17.0 g/dL   HCT 29.5 (L) 28.4 - 13.2 %    Comment: Performed at [facility], [address] [place], [place] [address]  Iron and TIBC     Status: Abnormal   Collection Time: 12/25/22  4:45 PM  Result Value Ref Range   Iron 41 (L) 45 - 182 ug/dL   TIBC 272 (L) 536 - 644 ug/dL   Saturation Ratios 35 17.9 - 39.5 %   UIBC 75 ug/dL    Comment: Performed at [facility], [address] [place], [place] [address]  Folate     Status: None   Collection Time: 12/25/22  4:45 PM  Result Value Ref Range   Folate 20.9 >5.9 ng/mL    Comment: Performed at [facility], [address], [place], [place] [address]   Vitamin B12     Status: Abnormal   Collection Time: 12/25/22  4:45 PM  Result Value Ref Range   Vitamin B-12 1,010 (H) 180 - 914 pg/mL    Comment: (NOTE) This assay is not validated for testing neonatal or myeloproliferative syndrome specimens for Vitamin B12 levels. Performed at [facility], [address] [place], [place] [address]   VITAMIN D 25 Hydroxy (Vit-D Deficiency, Fractures)     Status: Abnormal   Collection Time: 12/25/22  4:45 PM  Result Value Ref Range   Vit D, 25-Hydroxy 12.71 (L) 30 - 100 ng/mL    Comment: (NOTE) Vitamin D deficiency has been defined by the Institute of Medicine  and an Endocrine Society practice guideline as a level of serum 25-OH  vitamin D less than 20 ng/mL (1,2). The Endocrine Society went on to  further define vitamin D insufficiency as a level between 21 and 29  ng/mL (2).  1. IOM (Institute of Medicine). 2010. Dietary reference intakes for  calcium and D. [place]: [employer]. 2. Holick MF, Binkley Sulphur, Bischoff-Ferrari HA, et al. Evaluation,  treatment, and prevention of vitamin D deficiency: an Endocrine  Society clinical practice guideline, JCEM. 2011 Jul; 96(7): 1911-30.  Performed at [facility], [address] [place], [place] [address]   Hepatitis panel, acute     Status: None   Collection Time: 12/25/22  4:45 PM  Result Value Ref Range   Hepatitis B Surface Ag NON REACTIVE NON REACTIVE   HCV Ab NON REACTIVE NON REACTIVE    Comment: (NOTE) Nonreactive HCV antibody screen is consistent with no HCV infections,  unless recent infection is suspected or other evidence exists to indicate HCV infection.     Hep A IgM NON REACTIVE NON REACTIVE   Hep B C IgM NON REACTIVE NON REACTIVE    Comment: Performed at [facility], [address], [place], [place] [address]  Rapid urine drug screen (hospital performed)     Status: Abnormal   Collection Time: 12/25/22  5:38 PM  Result Value Ref  Range   Opiates NONE DETECTED NONE DETECTED   Cocaine POSITIVE (A) NONE DETECTED   Benzodiazepines NONE DETECTED NONE DETECTED   Amphetamines NONE DETECTED NONE DETECTED   Tetrahydrocannabinol POSITIVE (A) NONE DETECTED   Barbiturates NONE DETECTED NONE DETECTED    Comment: (NOTE) DRUG SCREEN FOR MEDICAL PURPOSES ONLY.  IF CONFIRMATION IS NEEDED FOR ANY PURPOSE, NOTIFY LAB WITHIN 5 DAYS.  LOWEST DETECTABLE LIMITS FOR URINE DRUG SCREEN Drug Class                     Cutoff (ng/mL) Amphetamine and metabolites    1000 Barbiturate and metabolites    200 Benzodiazepine                 200 Opiates and metabolites        300 Cocaine and metabolites        300 THC                            50 Performed at [facility], [address] [place], [place] [address]   Hemoglobin and hematocrit, blood     Status: Abnormal   Collection Time: 12/25/22 11:20 PM  Result Value Ref Range   Hemoglobin 9.7 (L) 13.0 - 17.0 g/dL   HCT 91.4 (L) 78.2 - 95.6 %    Comment: Performed at [facility], [address] [place], [place] [address]   US Abdomen Complete  Result Date: 12/25/2022 CLINICAL DATA:  Elevated liver function tests EXAM: ABDOMEN ULTRASOUND COMPLETE COMPARISON:  None Available. FINDINGS: Gallbladder: Gallbladder is not well seen. Please correlate for any known history or dedicated additional study as clinically appropriate. Common bile duct: Diameter: 4 mm Liver: No focal lesion identified. Within normal limits in parenchymal echogenicity. Portal vein is patent on color Doppler imaging with normal direction of blood flow towards the liver. IVC: No abnormality visualized. Pancreas: Visualized portion unremarkable. Pancreatic duct measured at 3 mm. Slightly ectatic Spleen: Size and appearance within normal limits. Right Kidney: Length: 11.0 cm. Echogenicity within normal limits. No mass or hydronephrosis visualized. Left Kidney: Length: 10.0 cm. Echogenicity within normal limits. No  mass or hydronephrosis visualized. Abdominal aorta: No aneurysm visualized. Other findings: None. IMPRESSION: No biliary ductal dilatation. However the gallbladder is not seen today. Please correlate with the history. There is also slight ectasia of the pancreatic duct 3 mm. With changes in the patient's history recommend further workup such as MRI and MRCP or postcontrast CT scan as clinically appropriate Electronically Signed   By: Piedad Climes.D.  On: 12/25/2022 16:48   DG Chest Portable 1 View  Result Date: 12/25/2022 CLINICAL DATA:  Dizziness EXAM: PORTABLE CHEST 1 VIEW COMPARISON:  X-ray 10/16/2018 FINDINGS: No consolidation, pneumothorax or effusion. No edema. Overlapping cardiac leads. Degenerative changes of the spine. Fixation hardware along the lower cervical spine. IMPRESSION: No acute cardiopulmonary disease. Electronically Signed   By: Karen Kays M.D.   On: 12/25/2022 12:39    Pending Labs Unresulted Labs (From admission, onward)     Start     Ordered   12/26/22 0500  Magnesium  Daily,   R      12/25/22 1241   12/26/22 0500  Phosphorus  Daily,   R      12/25/22 1241   12/26/22 0500  Basic metabolic panel  Daily,   R      12/25/22 1241   12/26/22 0500  CBC  Daily,   R      12/25/22 1241   12/26/22 0500  Hepatic function panel  Tomorrow morning,   R        12/25/22 1422   12/25/22 1422  Nucleotidase, 5', blood  Once,   R        12/25/22 1422   12/25/22 1312  Hemoglobin and hematocrit, blood  Now then every 8 hours,   R (with TIMED occurrences)     Comments: Call MD for hgb less than 7.5    12/25/22 1312            Vitals/Pain Today's Vitals   12/25/22 2200 12/25/22 2239 12/26/22 0100 12/26/22 0130  BP: (!) 118/91  116/74 115/71  Pulse: 85  67 69  Resp: (!) 22  16 16   Temp:  98.8 F (37.1 C)    TempSrc:  Oral    SpO2: 100%  100% 100%  PainSc:        Isolation Precautions No active isolations  Medications Medications  pantoprozole (PROTONIX) 80 mg /NS  100 mL infusion (8 mg/hr Intravenous New Bag/Given 12/25/22 1415)  sodium chloride flush (NS) 0.9 % injection 3 mL (3 mLs Intravenous Not Given 12/25/22 2134)  sodium chloride flush (NS) 0.9 % injection 3 mL (has no administration in time range)  0.9 %  sodium chloride infusion (has no administration in time range)  acetaminophen (TYLENOL) tablet 650 mg (has no administration in time range)    Or  acetaminophen (TYLENOL) suppository 650 mg (has no administration in time range)  ondansetron (ZOFRAN) tablet 4 mg (has no administration in time range)    Or  ondansetron (ZOFRAN) injection 4 mg (has no administration in time range)  thiamine (VITAMIN B1) tablet 100 mg (100 mg Oral Given 12/25/22 1355)  multivitamin with minerals tablet 1 tablet (1 tablet Oral Given 12/25/22 1355)  folic acid (FOLVITE) tablet 1 mg (1 mg Oral Given 12/25/22 1355)  LORazepam (ATIVAN) tablet 1-4 mg (has no administration in time range)    Or  LORazepam (ATIVAN) injection 1-4 mg (has no administration in time range)  hydrALAZINE (APRESOLINE) tablet 50 mg (has no administration in time range)  hydrALAZINE (APRESOLINE) injection 10 mg (has no administration in time range)  feeding supplement (ENSURE ENLIVE / ENSURE PLUS) liquid 237 mL (237 mLs Oral Given 12/25/22 2021)  influenza vac split trivalent PF (FLULAVAL) injection 0.5 mL (has no administration in time range)  Vitamin D (Ergocalciferol) (DRISDOL) 1.25 MG (50000 UNIT) capsule 50,000 Units (has no administration in time range)  sodium chloride 0.9 % bolus 1,000 mL (0 mLs Intravenous  Stopped 12/25/22 1240)  pantoprazole (PROTONIX) 80 mg /NS 100 mL IVPB (0 mg Intravenous Stopped 12/25/22 1435)  dextrose 50 % solution 50 mL (50 mLs Intravenous Given 12/25/22 1155)  potassium chloride 10 mEq in 100 mL IVPB (0 mEq Intravenous Stopped 12/25/22 1435)  potassium chloride SA (KLOR-CON M) CR tablet 40 mEq (40 mEq Oral Given 12/25/22 1647)    Mobility walks with device      Focused Assessments    R Recommendations: See Admitting Provider Note  Report given to:   Additional Notes: walks with a cane

## 2022-12-27 ENCOUNTER — Other Ambulatory Visit: Payer: Self-pay

## 2022-12-27 DIAGNOSIS — D649 Anemia, unspecified: Secondary | ICD-10-CM | POA: Diagnosis not present

## 2022-12-27 DIAGNOSIS — E876 Hypokalemia: Secondary | ICD-10-CM

## 2022-12-27 DIAGNOSIS — R42 Dizziness and giddiness: Secondary | ICD-10-CM | POA: Diagnosis not present

## 2022-12-27 DIAGNOSIS — R195 Other fecal abnormalities: Secondary | ICD-10-CM | POA: Diagnosis not present

## 2022-12-27 DIAGNOSIS — K922 Gastrointestinal hemorrhage, unspecified: Secondary | ICD-10-CM | POA: Diagnosis not present

## 2022-12-27 LAB — CBC
HCT: 32.5 % — ABNORMAL LOW (ref 39.0–52.0)
Hemoglobin: 10.4 g/dL — ABNORMAL LOW (ref 13.0–17.0)
MCH: 30.1 pg (ref 26.0–34.0)
MCHC: 32 g/dL (ref 30.0–36.0)
MCV: 93.9 fL (ref 80.0–100.0)
Platelets: 518 10*3/uL — ABNORMAL HIGH (ref 150–400)
RBC: 3.46 MIL/uL — ABNORMAL LOW (ref 4.22–5.81)
RDW: 12.6 % (ref 11.5–15.5)
WBC: 8.8 10*3/uL (ref 4.0–10.5)
nRBC: 0 % (ref 0.0–0.2)

## 2022-12-27 LAB — MAGNESIUM: Magnesium: 1.7 mg/dL (ref 1.7–2.4)

## 2022-12-27 LAB — PHOSPHORUS: Phosphorus: 3.1 mg/dL (ref 2.5–4.6)

## 2022-12-27 LAB — BASIC METABOLIC PANEL
Anion gap: 8 (ref 5–15)
BUN: <5 mg/dL — ABNORMAL LOW (ref 6–20)
CO2: 29 mmol/L (ref 22–32)
Calcium: 9 mg/dL (ref 8.9–10.3)
Chloride: 97 mmol/L — ABNORMAL LOW (ref 98–111)
Creatinine, Ser: 0.73 mg/dL (ref 0.61–1.24)
GFR, Estimated: >60 mL/min (ref >60)
Glucose, Bld: 87 mg/dL (ref 70–99)
Potassium: 3.6 mmol/L (ref 3.5–5.1)
Sodium: 134 mmol/L — ABNORMAL LOW (ref 135–145)

## 2022-12-27 MED ORDER — PANTOPRAZOLE SODIUM 40 MG PO TBEC
40.0000 mg | DELAYED_RELEASE_TABLET | Freq: Every day | ORAL | 1 refills | Status: DC
Start: 2022-12-27 — End: 2023-05-31
  Filled 2022-12-27: qty 30, 30d supply, fill #0
  Filled 2023-01-29: qty 30, 30d supply, fill #1

## 2022-12-27 MED ORDER — FOLIC ACID 1 MG PO TABS
1.0000 mg | ORAL_TABLET | Freq: Every day | ORAL | 2 refills | Status: DC
Start: 2022-12-28 — End: 2023-05-31
  Filled 2022-12-27: qty 30, 30d supply, fill #0
  Filled 2023-01-29: qty 30, 30d supply, fill #1
  Filled 2023-04-11: qty 30, 30d supply, fill #2

## 2022-12-27 MED ORDER — VITAMIN B-1 100 MG PO TABS
100.0000 mg | ORAL_TABLET | Freq: Every day | ORAL | 2 refills | Status: DC
  Filled 2022-12-27: qty 30, 30d supply, fill #0

## 2022-12-27 NOTE — Discharge Summary (Addendum)
Physician Discharge Summary   Patient: Robert Carney MRN: [id number] DOB: [birth year]  Admit date:     12/25/2022  Discharge date: 12/27/22  Discharge Physician: Marcelino Duster   PCP: [name], NP   Recommendations at discharge:    PCP follow up in 1 week  Discharge Diagnoses: Principal Problem:   Acute GI bleeding Active Problems:   HYPERTENSION, BENIGN   ETOH abuse   Normocytic anemia   Heme positive stool   Elevated LFTs   Melena   Dizziness  Resolved Problems:   * No resolved hospital problems. *  Hospital Course: Robert Carney is a 57 y.o. male with Past medical history of hypertension, cervical myelopathy, gait abnormality, EtOH use drinks 3-4 beers per day, smokes 3 to 4 cigarettes/day, as reviewed from EMR, presented at [facility], ED with complaining of dizziness for 1 week. In ED noted to have FOBT positive admitted for further management.   Assessment and Plan: Acute GI bleeding - H/H dropped to 9.7 Continue PPI GI consult appreciated. S/p EGD unremarkable finding. He is able to tolerate diet. No more episodes of bleeding.   Hypertension- His BP meds adjusted. Advised beta blocker therapy.   Hypokalemia- Repleted K. Potassium supplements daily. Follow PCP for further management.   ETOH abuse- Continue thiamine, folate and multivitamin. Advised to quit alcohol.   Tobacco abuse- Smoking cessation counseled.   Moderate malnutrition- BMI 16.4 Dietician evaluated him. Encourage oral diet, supplements,        Consultants: GI Procedures performed: Endoscopy Disposition: Home Diet recommendation:  Discharge Diet Orders (From admission, onward)     Start     Ordered   12/27/22 0000  Diet - low sodium heart healthy        12/27/22 1058           Cardiac diet DISCHARGE MEDICATION: Allergies as of 12/27/2022       Reactions   No Known Allergies         Medication List     STOP taking these medications     amLODipine 10 MG tablet Commonly known as: NORVASC   hydrochlorothiazide 25 MG tablet Commonly known as: HYDRODIURIL       TAKE these medications    folic acid 1 MG tablet Commonly known as: FOLVITE Take 1 tablet (1 mg total) by mouth daily.   loperamide 2 MG capsule Commonly known as: IMODIUM Take 1 capsule (2 mg total) by mouth 4 (four) times daily as needed for diarrhea or loose stools.   metoprolol tartrate 25 MG tablet Commonly known as: LOPRESSOR Take 0.5 tablets (12.5 mg total) by mouth 2 (two) times daily. What changed: how much to take   pantoprazole 40 MG tablet Commonly known as: Protonix Take 1 tablet (40 mg total) by mouth daily.   potassium chloride 10 MEQ tablet Commonly known as: KLOR-CON Take 2 tablets (20 mEq total) by mouth daily.   thiamine 100 MG tablet Commonly known as: Vitamin B-1 Take 1 tablet (100 mg total) by mouth daily.   VITAMIN B 12 PO Take 1 tablet by mouth daily.        Follow-up Information     [name], NP. Call.   Specialty: Internal Medicine Why: Call the clinic and schedule a hospital follow up in the next 7-10 days. Contact information: [address] [place] [address] [phone]                Discharge Exam:  12/27/2022    7:35 AM 12/27/2022    5:00 AM 12/26/2022   11:00 PM  Vitals with BMI  Systolic 127 134 433  Diastolic 82 96 73  Pulse 74 68 65    Filed Weights   12/26/22 0430 12/26/22 0840  Weight: 54.9 kg 54.9 kg   General - Middle aged thin built [place] American male, no apparent distress HEENT - PERRLA, EOMI, atraumatic head, non tender sinuses. Lung - Clear, no rales, rhonchi, wheezes. Heart - S1, S2 heard, no murmurs, rubs, no pedal edema. Abdomen - Soft, non tender, bowel sounds good Neuro - Alert, awake and oriented x 3, non focal exam. Skin - Warm and dry.  Condition at discharge: stable  The results of significant diagnostics from this  hospitalization (including imaging, microbiology, ancillary and laboratory) are listed below for reference.   Imaging Studies: US Abdomen Complete  Result Date: 12/25/2022 CLINICAL DATA:  Elevated liver function tests EXAM: ABDOMEN ULTRASOUND COMPLETE COMPARISON:  None Available. FINDINGS: Gallbladder: Gallbladder is not well seen. Please correlate for any known history or dedicated additional study as clinically appropriate. Common bile duct: Diameter: 4 mm Liver: No focal lesion identified. Within normal limits in parenchymal echogenicity. Portal vein is patent on color Doppler imaging with normal direction of blood flow towards the liver. IVC: No abnormality visualized. Pancreas: Visualized portion unremarkable. Pancreatic duct measured at 3 mm. Slightly ectatic Spleen: Size and appearance within normal limits. Right Kidney: Length: 11.0 cm. Echogenicity within normal limits. No mass or hydronephrosis visualized. Left Kidney: Length: 10.0 cm. Echogenicity within normal limits. No mass or hydronephrosis visualized. Abdominal aorta: No aneurysm visualized. Other findings: None. IMPRESSION: No biliary ductal dilatation. However the gallbladder is not seen today. Please correlate with the history. There is also slight ectasia of the pancreatic duct 3 mm. With changes in the patient's history recommend further workup such as MRI and MRCP or postcontrast CT scan as clinically appropriate Electronically Signed   By: Karen Kays M.D.   On: 12/25/2022 16:48   DG Chest Portable 1 View  Result Date: 12/25/2022 CLINICAL DATA:  Dizziness EXAM: PORTABLE CHEST 1 VIEW COMPARISON:  X-ray 10/16/2018 FINDINGS: No consolidation, pneumothorax or effusion. No edema. Overlapping cardiac leads. Degenerative changes of the spine. Fixation hardware along the lower cervical spine. IMPRESSION: No acute cardiopulmonary disease. Electronically Signed   By: Karen Kays M.D.   On: 12/25/2022 12:39    Microbiology: Results for  orders placed or performed in visit on 10/28/18  Fecal occult blood, imunochemical([facility])     Status: None   Collection Time: 10/29/18  8:48 AM   Specimen: STOOL   ST  Result Value Ref Range Status   Fecal Occult Bld Negative Negative Final    Labs: CBC: Recent Labs  Lab 12/25/22 0952 12/25/22 1337 12/25/22 2320 12/26/22 0533 12/26/22 1246 12/26/22 2128 12/27/22 0829  WBC 5.3  --   --  7.3  --   --  8.8  HGB 11.1*   < > 9.7* 9.9* 10.8* 9.7* 10.4*  HCT 35.2*   < > 31.3* 31.7* 34.4* 31.2* 32.5*  MCV 97.8  --   --  95.2  --   --  93.9  PLT 444*  --   --  447*  --   --  518*   < > = values in this interval not displayed.   Basic Metabolic Panel: Recent Labs  Lab 12/25/22 0952 12/25/22 1131 12/26/22 0533 12/27/22 0829  NA 135  --  136  134*  K 3.2*  --  3.9 3.6  CL 99  --  99 97*  CO2 29  --  25 29  GLUCOSE 81  --  93 87  BUN <5*  --  <5* <5*  CREATININE 0.69  --  0.89 0.73  CALCIUM 8.9  --  8.9 9.0  MG  --  1.8 1.6* 1.7  PHOS 4.1  --  2.9 3.1   Liver Function Tests: Recent Labs  Lab 12/25/22 1131 12/26/22 0533  AST 38 26  ALT 57* 44  ALKPHOS 215* 169*  BILITOT 0.6 0.3  PROT 7.1 6.6  ALBUMIN 2.7* 2.4*   CBG: Recent Labs  Lab 12/25/22 1006 12/25/22 1124  GLUCAP 78 61*    Discharge time spent: 33 minutes.  Signed: Marcelino Duster, MD [facility] 12/28/2022

## 2022-12-27 NOTE — Plan of Care (Signed)

## 2022-12-28 ENCOUNTER — Other Ambulatory Visit: Payer: Self-pay

## 2022-12-28 ENCOUNTER — Other Ambulatory Visit (INDEPENDENT_AMBULATORY_CARE_PROVIDER_SITE_OTHER): Payer: Self-pay | Admitting: Primary Care

## 2022-12-28 ENCOUNTER — Telehealth (INDEPENDENT_AMBULATORY_CARE_PROVIDER_SITE_OTHER): Payer: Self-pay

## 2022-12-28 ENCOUNTER — Encounter (HOSPITAL_COMMUNITY): Payer: Self-pay | Admitting: Gastroenterology

## 2022-12-28 NOTE — Telephone Encounter (Signed)
Medication Refill - Medication: loperamide (IMODIUM) 2 MG capsule   Has the patient contacted their pharmacy? No.  Preferred Pharmacy (with phone number or street name):  [facility] Phone: [phone]  Fax: [phone]     Has the patient been seen for an appointment in the last year OR does the patient have an upcoming appointment? Yes.    Agent: Please be advised that RX refills may take up to 3 business days. We ask that you follow-up with your pharmacy.  Patient is asking for someone to contact him when the medication is sent to the pharmacy

## 2022-12-28 NOTE — Transitions of Care (Post Inpatient/ED Visit) (Signed)
   12/28/2022  Name: Robert Carney MRN: [id number] DOB: Mar 10, 1966  Today's TOC FU Call Status: Today's TOC FU Call Status:: Successful TOC FU Call Completed TOC FU Call Complete Date: 12/28/22 Patient's Name and Date of Birth confirmed.  Transition Care Management Follow-up Telephone Call Date of Discharge: 12/27/22 Discharge Facility: [facility] [place]) Type of Discharge: Inpatient Admission Primary Inpatient Discharge Diagnosis:: GI bleed How have you been since you were released from the hospital?: Better Any questions or concerns?: No  Items Reviewed: Did you receive and understand the discharge instructions provided?: Yes Medications obtained,verified, and reconciled?: Yes (Medications Reviewed) Any new allergies since your discharge?: No Dietary orders reviewed?: Yes Do you have support at home?: No  Medications Reviewed Today: Medications Reviewed Today     Reviewed by Karena Addison, LPN (Licensed Practical Nurse) on 12/28/22 at 1128  Med List Status: <None>   Medication Order Taking? Sig Documenting Provider Last Dose Status Informant  Cyanocobalamin (VITAMIN B 12 PO) [id number] No Take 1 tablet by mouth daily. [provider] 12/25/2022 Active Self  folic acid (FOLVITE) 1 MG tablet [id number]  Take 1 tablet (1 mg total) by mouth daily. Marcelino Duster, MD  Active   loperamide (IMODIUM) 2 MG capsule [id number] No Take 1 capsule (2 mg total) by mouth 4 (four) times daily as needed for diarrhea or loose stools. Carlisle Beers, FNP 12/25/2022 Active Self  metoprolol tartrate (LOPRESSOR) 25 MG tablet [id number] No Take 0.5 tablets (12.5 mg total) by mouth 2 (two) times daily.  Patient taking differently: Take 25 mg by mouth 2 (two) times daily.   [name], NP 12/25/2022 Active Self  pantoprazole (PROTONIX) 40 MG tablet [id number]  Take 1 tablet (40 mg total) by mouth daily. Marcelino Duster, MD  Active   potassium chloride (KLOR-CON) 10 MEQ  tablet [id number] No Take 2 tablets (20 mEq total) by mouth daily. Carlisle Beers, FNP 12/25/2022 Active Self  thiamine (VITAMIN B-1) 100 MG tablet [id number]  Take 1 tablet (100 mg total) by mouth daily. Marcelino Duster, MD  Active             Home Care and Equipment/Supplies: Were Home Health Services Ordered?: NA Any new equipment or medical supplies ordered?: NA  Functional Questionnaire: Do you need assistance with bathing/showering or dressing?: No Do you need assistance with meal preparation?: No Do you need assistance with eating?: No Do you have difficulty maintaining continence: No Do you need assistance with getting out of bed/getting out of a chair/moving?: No Do you have difficulty managing or taking your medications?: No  Follow up appointments reviewed: PCP Follow-up appointment confirmed?: Yes Date of PCP follow-up appointment?: 01/10/23 Follow-up Provider: [facility] Follow-up appointment confirmed?: NA Do you need transportation to your follow-up appointment?: No Do you understand care options if your condition(s) worsen?: Yes-patient verbalized understanding    SIGNATURE Karena Addison, LPN [facility] Nurse Health Advisor Direct Dial [phone]

## 2022-12-28 NOTE — Telephone Encounter (Signed)
Requested medication (s) are due for refill today: Yes  Requested medication (s) are on the active medication list: Yes  Last refill:  12/19/22  Future visit scheduled: Yes  Notes to clinic:  Unable to refill per protocol, last refill by another provider. Sent to the wrong pharmacy when discharged from the hospital, will need to be resent.     Requested Prescriptions  Pending Prescriptions Disp Refills   loperamide (IMODIUM) 2 MG capsule 12 capsule 0    Sig: Take 1 capsule (2 mg total) by mouth 4 (four) times daily as needed for diarrhea or loose stools.     Over the Counter:  OTC Passed - 12/28/2022  9:50 AM      Passed - Valid encounter within last 12 months    Recent Outpatient Visits           8 months ago Essential hypertension   [place] [facility] [name], NP   1 year ago Need for shingles vaccine   [place] [facility] [name], NP   1 year ago Colon cancer screening   [place] [facility] [name], NP   2 years ago Bilateral impacted cerumen   [place] [facility] [name], NP   2 years ago Need for immunization against influenza   [place] [facility] [name], NP       Future Appointments             In 1 week Randa Evens, Kinnie Scales, NP [place] [facility]

## 2023-01-10 ENCOUNTER — Encounter (INDEPENDENT_AMBULATORY_CARE_PROVIDER_SITE_OTHER): Payer: Self-pay | Admitting: Primary Care

## 2023-01-10 ENCOUNTER — Ambulatory Visit (INDEPENDENT_AMBULATORY_CARE_PROVIDER_SITE_OTHER): Payer: 59 | Admitting: Primary Care

## 2023-01-10 VITALS — BP 115/76 | HR 60 | Resp 16 | Ht 72.0 in | Wt 128.2 lb

## 2023-01-10 DIAGNOSIS — F17210 Nicotine dependence, cigarettes, uncomplicated: Secondary | ICD-10-CM | POA: Diagnosis not present

## 2023-01-10 DIAGNOSIS — R42 Dizziness and giddiness: Secondary | ICD-10-CM

## 2023-01-10 DIAGNOSIS — F17200 Nicotine dependence, unspecified, uncomplicated: Secondary | ICD-10-CM

## 2023-01-10 DIAGNOSIS — H6121 Impacted cerumen, right ear: Secondary | ICD-10-CM

## 2023-01-10 DIAGNOSIS — I10 Essential (primary) hypertension: Secondary | ICD-10-CM | POA: Diagnosis not present

## 2023-01-10 DIAGNOSIS — F1010 Alcohol abuse, uncomplicated: Secondary | ICD-10-CM

## 2023-01-10 DIAGNOSIS — Z09 Encounter for follow-up examination after completed treatment for conditions other than malignant neoplasm: Secondary | ICD-10-CM

## 2023-01-10 NOTE — Patient Instructions (Signed)

## 2023-01-10 NOTE — Progress Notes (Addendum)
[facility]   Subjective:  Robert Carney is a 57 y.o. male presents for hospital follow up. Patient first seek care from Urgent care on 12/19/22 for dizziness and syncope.  Symptoms returned and patient presented to the ED. Admit date to the hospital was 12/25/22, patient was discharged from the hospital on 12/27/22, patient was admitted [id number], BENIGN, ETOH abuse, Acute GI bleeding, Normocytic anemia ,Heme positive stool , Elevated LFTs, Melena and Dizziness.During hospitalization Upper Endoscopy. Today Patient has No headache, No chest pain, No abdominal pain - No Nausea, No new weakness tingling or numbness, No Cough - shortness of breath. Feels pretty good. He walked to his appt with no issues- walk. Patient states decrease tobacco and alcohol abuse. Unable to quantify reduction.  Past Medical History:  Diagnosis Date   Cervical myelopathy (HCC) 11/01/2016   Gait abnormality 11/01/2016   Hypertension    no longer taking meds     Allergies  Allergen Reactions   No Known Allergies       Current Outpatient Medications on File Prior to Visit  Medication Sig Dispense Refill   Cyanocobalamin (VITAMIN B 12 PO) Take 1 tablet by mouth daily.     folic acid (FOLVITE) 1 MG tablet Take 1 tablet (1 mg total) by mouth daily. 30 tablet 2   loperamide (IMODIUM) 2 MG capsule Take 1 capsule (2 mg total) by mouth 4 (four) times daily as needed for diarrhea or loose stools. 12 capsule 0   metoprolol tartrate (LOPRESSOR) 25 MG tablet Take 0.5 tablets (12.5 mg total) by mouth 2 (two) times daily. (Patient taking differently: Take 25 mg by mouth 2 (two) times daily.) 180 tablet 1   pantoprazole (PROTONIX) 40 MG tablet Take 1 tablet (40 mg total) by mouth daily. 30 tablet 1   potassium chloride (KLOR-CON) 10 MEQ tablet Take 2 tablets (20 mEq total) by mouth daily. 2 tablet 0   thiamine (VITAMIN B-1) 100 MG tablet Take 1 tablet (100 mg total) by mouth daily. 30 tablet 2    No current facility-administered medications on file prior to visit.     Review of System: Comprehensive ROS Pertinent positive and negative noted in HPI    Objective:  BP 115/76   Pulse 60   Resp 16   Ht 6' (1.829 m)   Wt 128 lb 3.2 oz (58.2 kg)   SpO2 100%   BMI 17.39 kg/m   Filed Weights   01/10/23 1347  Weight: 128 lb 3.2 oz (58.2 kg)    Physical Exam: General Appearance: Thin frame, in no apparent distress. Eyes: PERRLA, EOMs, conjunctiva no swelling or erythema Sinuses: No Frontal/maxillary tenderness ENT/Mouth: Ext aud canals clear, TMs without erythema, bulging left. Right ear cerumen impaction. No erythema, swelling, or exudate on post pharynx.  Tonsils not swollen or erythematous. Hearing normal.  Neck: Supple, thyroid normal.  Respiratory: Respiratory effort normal, BS equal bilaterally without rales, rhonchi, wheezing or stridor.  Cardio: RRR with no MRGs. Brisk peripheral pulses without edema.  Abdomen: Soft, + BS.  Non tender, no guarding, rebound, hernias, masses. Lymphatics: Non tender without lymphadenopathy.  Musculoskeletal: Full ROM, 5/5 strength, normal gait.  Skin: Warm, dry without rashes, lesions, ecchymosis.  Neuro: Cranial nerves intact. Normal muscle tone, no cerebellar symptoms. Sensation intact.  Psych: Awake and oriented X 3, normal affect, Insight and Judgment appropriate.    Assessment:  Robert Carney was seen today for hospitalization follow-up.  Diagnoses and all orders for this visit:  [facility]  discharge follow-up F/u with PCP   Dizziness Resolved probable due to dehydration and GI bleed- resolved   Essential hypertension Well controlled  On metoprolol    Impacted cerumen of right ear  Return for ear irrigation   ETOH abuse See HPI  Tobacco use disorder - I have recommended complete cessation of tobacco use. I have discussed various options available for assistance with tobacco cessation including over the counter methods  (Nicotine gum, patch and lozenges). We also discussed prescription options (Chantix, Nicotine Inhaler / Nasal Spray). The patient is not interested in pursuing any prescription tobacco cessation options at this time. - Patient declines at this time.  - Less than 5 minutes spent on counseling.     This note has been created with Education officer, environmental. Any transcriptional errors are unintentional.   [name], NP 01/10/2023, 1:57 PM

## 2023-01-25 ENCOUNTER — Ambulatory Visit (INDEPENDENT_AMBULATORY_CARE_PROVIDER_SITE_OTHER): Payer: 59

## 2023-01-29 ENCOUNTER — Other Ambulatory Visit: Payer: Self-pay

## 2023-01-29 ENCOUNTER — Encounter (INDEPENDENT_AMBULATORY_CARE_PROVIDER_SITE_OTHER): Payer: 59 | Admitting: Primary Care

## 2023-01-31 ENCOUNTER — Other Ambulatory Visit: Payer: Self-pay

## 2023-03-27 ENCOUNTER — Telehealth (INDEPENDENT_AMBULATORY_CARE_PROVIDER_SITE_OTHER): Payer: Self-pay | Admitting: Primary Care

## 2023-04-03 ENCOUNTER — Ambulatory Visit (HOSPITAL_COMMUNITY)
Admission: EM | Admit: 2023-04-03 | Discharge: 2023-04-03 | Disposition: A | Discharge status: Home / Self Care | Payer: 59 | Attending: Emergency Medicine | Admitting: Emergency Medicine

## 2023-04-03 ENCOUNTER — Encounter (HOSPITAL_COMMUNITY): Payer: Self-pay | Admitting: Emergency Medicine

## 2023-04-03 ENCOUNTER — Other Ambulatory Visit: Payer: Self-pay

## 2023-04-03 DIAGNOSIS — R140 Abdominal distension (gaseous): Secondary | ICD-10-CM | POA: Diagnosis not present

## 2023-04-03 DIAGNOSIS — K5900 Constipation, unspecified: Secondary | ICD-10-CM | POA: Diagnosis not present

## 2023-04-03 LAB — CBC WITH DIFFERENTIAL/PLATELET
Abs Immature Granulocytes: 0.03 10*3/uL (ref 0.00–0.07)
Basophils Absolute: 0.1 10*3/uL (ref 0.0–0.1)
Basophils Relative: 1 %
Eosinophils Absolute: 0.1 10*3/uL (ref 0.0–0.5)
Eosinophils Relative: 1 %
HCT: 33.4 % — ABNORMAL LOW (ref 39.0–52.0)
Hemoglobin: 11.1 g/dL — ABNORMAL LOW (ref 13.0–17.0)
Immature Granulocytes: 1 %
Lymphocytes Relative: 36 %
Lymphs Abs: 2.3 10*3/uL (ref 0.7–4.0)
MCH: 31.8 pg (ref 26.0–34.0)
MCHC: 33.2 g/dL (ref 30.0–36.0)
MCV: 95.7 fL (ref 80.0–100.0)
Monocytes Absolute: 0.6 10*3/uL (ref 0.1–1.0)
Monocytes Relative: 9 %
Neutro Abs: 3.3 10*3/uL (ref 1.7–7.7)
Neutrophils Relative %: 52 %
Platelets: 199 10*3/uL (ref 150–400)
RBC: 3.49 MIL/uL — ABNORMAL LOW (ref 4.22–5.81)
RDW: 14.4 % (ref 11.5–15.5)
WBC: 6.2 10*3/uL (ref 4.0–10.5)
nRBC: 0 % (ref 0.0–0.2)

## 2023-04-03 LAB — COMPREHENSIVE METABOLIC PANEL
ALT: 38 U/L (ref 0–44)
AST: 121 U/L — ABNORMAL HIGH (ref 15–41)
Albumin: 2.2 g/dL — ABNORMAL LOW (ref 3.5–5.0)
Alkaline Phosphatase: 208 U/L — ABNORMAL HIGH (ref 38–126)
Anion gap: 11 (ref 5–15)
BUN: <5 mg/dL — ABNORMAL LOW (ref 6–20)
CO2: 25 mmol/L (ref 22–32)
Calcium: 8.4 mg/dL — ABNORMAL LOW (ref 8.9–10.3)
Chloride: 95 mmol/L — ABNORMAL LOW (ref 98–111)
Creatinine, Ser: 0.77 mg/dL (ref 0.61–1.24)
GFR, Estimated: >60 mL/min (ref >60)
Glucose, Bld: 99 mg/dL (ref 70–99)
Potassium: 4.4 mmol/L (ref 3.5–5.1)
Sodium: 131 mmol/L — ABNORMAL LOW (ref 135–145)
Total Bilirubin: 1.2 mg/dL (ref 0.0–1.2)
Total Protein: 7.6 g/dL (ref 6.5–8.1)

## 2023-04-03 LAB — LIPASE, BLOOD: Lipase: 34 U/L (ref 11–51)

## 2023-04-03 NOTE — ED Provider Notes (Signed)
 [facility]    CSN: [id number] Arrival date & time: 04/03/23  1641      History   Chief Complaint Chief Complaint  Patient presents with   Abdominal Pain    HPI Robert Carney is a 58 y.o. male.   Patient presents to clinic for generalized abdominal pain that started a week ago while he was on a cruise.  He has had abdominal swelling/distention since then.  He has not really had frequent bowel movements.  His last bowel movement was yesterday and it was a small amount and hard stool.  He is passing a small amount of gas, reports 1 episode of flatulence earlier today.  He does drink alcohol daily, did drink more alcohol than usual on the cruise.  Did not want to quantify amounts in clinic.  Denies any nausea or vomiting.  No fevers.  History of polysubstance and alcohol abuse.  The history is provided by the patient and medical records.  Abdominal Pain   Past Medical History:  Diagnosis Date   Cervical myelopathy (HCC) 11/01/2016   Gait abnormality 11/01/2016   Hypertension    no longer taking meds    Patient Active Problem List   Diagnosis Date Noted   Melena 12/26/2022   Dizziness 12/26/2022   Acute GI bleeding 12/25/2022   Normocytic anemia 12/25/2022   Heme positive stool 12/25/2022   Elevated LFTs 12/25/2022   Cervical myelopathy (HCC) 11/01/2016   Gait abnormality 11/01/2016   Elevated blood protein 10/02/2016   Cervical spondylosis with myelopathy 03/26/2016   Hyperglycemia 03/26/2016   Polysubstance abuse (HCC) 03/26/2016   ETOH abuse 03/26/2016   Hypertensive urgency    Lower extremity weakness 03/21/2016   HYPERTENSION, BENIGN 01/30/2010   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   CHEST PAIN-UNSPECIFIED 01/30/2010   ABNORMAL ELECTROCARDIOGRAM 01/30/2010    Past Surgical History:  Procedure Laterality Date   ANTERIOR CERVICAL CORPECTOMY N/A 03/26/2016   Procedure: Cervical  Seven Anterior cervical corpectomy;  Surgeon:  Raelene Bullocks Ditty, MD;  Location: [facility] OR;  Service: Neurosurgery;  Laterality: N/A;  Cervical  Seven Anterior cervical corpectomy   ESOPHAGOGASTRODUODENOSCOPY (EGD) WITH PROPOFOL  N/A 12/26/2022   Procedure: ESOPHAGOGASTRODUODENOSCOPY (EGD) WITH PROPOFOL ;  Surgeon: Albertina Hugger, MD;  Location: [facility] ENDOSCOPY;  Service: Gastroenterology;  Laterality: N/A;   tooth pulled         Home Medications    Prior to Admission medications   Medication Sig Start Date End Date Taking? Authorizing Provider  Cyanocobalamin  (VITAMIN B 12 PO) Take 1 tablet by mouth daily.    [provider]  folic acid  (FOLVITE ) 1 MG tablet Take 1 tablet (1 mg total) by mouth daily. 12/28/22   Aisha Hove, MD  loperamide  (IMODIUM ) 2 MG capsule Take 1 capsule (2 mg total) by mouth 4 (four) times daily as needed for diarrhea or loose stools. 12/19/22   Starlene Eaton, FNP  metoprolol  tartrate (LOPRESSOR ) 25 MG tablet Take 0.5 tablets (12.5 mg total) by mouth 2 (two) times daily. Patient taking differently: Take 25 mg by mouth 2 (two) times daily. 04/18/22   Marius Siemens, NP  pantoprazole  (PROTONIX ) 40 MG tablet Take 1 tablet (40 mg total) by mouth daily. 12/27/22 03/02/23  Aisha Hove, MD  potassium chloride  (KLOR-CON ) 10 MEQ tablet Take 2 tablets (20 mEq total) by mouth daily. 12/19/22   Starlene Eaton, FNP  thiamine  (VITAMIN B-1) 100 MG tablet Take 1 tablet (100 mg total) by mouth  daily. 12/28/22   Aisha Hove, MD    Family History Family History  Problem Relation Age of Onset   Diabetes Mother    Lung cancer Mother    Hypertension Mother    Prostate cancer Father    Hypertension Father    Breast cancer Sister    Breast cancer Maternal Aunt    Colon cancer Neg Hx    Colon polyps Neg Hx    Esophageal cancer Neg Hx    Rectal cancer Neg Hx    Stomach cancer Neg Hx     Social History Social History   Tobacco Use   Smoking status: Some Days    Current  packs/day: 0.20    Types: Cigarettes   Smokeless tobacco: Never  Vaping Use   Vaping status: Never Used  Substance Use Topics   Alcohol use: Yes    Alcohol/week: 42.0 standard drinks of alcohol    Types: 42 Cans of beer per week    Comment: Daily beer drinker   Drug use: Yes    Types: Marijuana    Comment: No history of IV drug use.     Allergies   No known allergies   Review of Systems Review of Systems  Per HPI   Physical Exam Triage Vital Signs ED Triage Vitals  Encounter Vitals Group     BP 04/03/23 1910 134/81     Systolic BP Percentile --      Diastolic BP Percentile --      Pulse Rate 04/03/23 1910 81     Resp 04/03/23 1910 18     Temp 04/03/23 1910 (!) 97.4 F (36.3 C)     Temp Source 04/03/23 1910 Oral     SpO2 04/03/23 1910 98 %     Weight --      Height --      Head Circumference --      Peak Flow --      Pain Score 04/03/23 1907 2     Pain Loc --      Pain Education --      Exclude from Growth Chart --    No data found.  Updated Vital Signs BP 134/81 (BP Location: Left Arm)   Pulse 81   Temp (!) 97.4 F (36.3 C) (Oral)   Resp 18   SpO2 98%   Visual Acuity Right Eye Distance:   Left Eye Distance:   Bilateral Distance:    Right Eye Near:   Left Eye Near:    Bilateral Near:     Physical Exam Vitals and nursing note reviewed.  Constitutional:      Appearance: He is well-developed.  HENT:     Head: Normocephalic and atraumatic.     Mouth/Throat:     Mouth: Mucous membranes are moist.  Eyes:     General: Scleral icterus present.  Cardiovascular:     Rate and Rhythm: Normal rate and regular rhythm.     Heart sounds: Normal heart sounds. No murmur heard. Pulmonary:     Effort: Pulmonary effort is normal. No respiratory distress.     Breath sounds: Normal breath sounds.  Abdominal:     General: Bowel sounds are normal. There is distension.     Tenderness: There is no abdominal tenderness. There is no guarding or rebound.      Hernia: No hernia is present.  Neurological:     Mental Status: He is alert.      UC Treatments / Results  Labs (  all labs ordered are listed, but only abnormal results are displayed) Labs Reviewed  COMPREHENSIVE METABOLIC PANEL  CBC WITH DIFFERENTIAL/PLATELET  LIPASE, BLOOD    EKG   Radiology No results found.  Procedures Procedures (including critical care time)  Medications Ordered in UC Medications - No data to display  Initial Impression / Assessment and Plan / UC Course  I have reviewed the triage vital signs and the nursing notes.  Pertinent labs & imaging results that were available during my care of the patient were reviewed by me and considered in my medical decision making (see chart for details).  Vitals and triage reviewed, patient is hemodynamically stable.  Generalized abdominal distention.  Does have active bowel sounds.  No tenderness, rebound or guarding to palpation.  Lungs are vesicular, heart with regular rate and rhythm.  Does have some scleral icterus on exam, history of polysubstance and alcohol abuse.  Will check some basic labs, concern over AST and ALT function.  Does appear to be constipated, will recommend treatment for this as well.  Discussed that due to our limited evaluation, depending on labs patient may end up at the emergency department for advanced imaging and further evaluation.  Patient agreeable to this outcome.  Strict emergency precautions given if vomiting or new stool despite interventions.       Final Clinical Impressions(s) / UC Diagnoses   Final diagnoses:  Abdominal distension  Constipation, unspecified constipation type     Discharge Instructions      Take the MiraLAX and Colace.  We have checked some basic labs and we will contact you if anything requires urgent follow-up.  Seek immediate care if you develop any vomiting, no stool over the next 3 days, or any new concerning symptoms.  For moderate to severe  constipation (not having a bowel movement in more than 3 days) then try to use an enema or Miralax once daily until you have a good bowel movement.  It is not a good idea to use an enema or laxatives daily. If you find you are doing this, then please follow up with a gastroenterologist. Otherwise, a medication you could use daily to help with promoting bowel movements is docusate (Colace) 100mg . It is okay to use this 1-2 times daily as a stool softener.  Try to stay active physically including regular exercise 2-3 times a week.  Make sure you hydrate well every day with about 64 ounces of water daily (that is 2 liters).  Try to avoid carb heavy foods, dairy. This includes cutting out breads, pasta, pizza, pastries, potatoes, rice, starchy foods in general. Eat more fiber as listed below:  Salads - kale, spinach, cabbage, spring mix, arugula Fruits - avocadoes, berries (blueberries, raspberries, blackberries), apples, oranges, pomegranate, grapefruit, kiwi Vegetables - asparagus, cauliflower, broccoli, green beans, brussel sprouts, bell peppers, beets; stay away from or limit starchy vegetables like potatoes, carrots, peas Other general foods - kidney beans, egg whites, almonds, walnuts, sunflower seeds, pumpkin seeds, fat free yogurt, almond milk, flax seeds, quinoa, oats  Meat - It is better to eat lean meats and limit your red meat including pork to once a week.  Wild caught fish, chicken breast are good options as they tend to be leaner sources of good protein. Still be mindful of the sodium labels for the meats you buy.  DO NOT EAT ANY FOODS ON THIS LIST THAT YOU ARE ALLERGIC TO. For more specific needs, I highly recommend consulting a dietician or nutritionist but this  can definitely be a good starting point.      ED Prescriptions   None    PDMP not reviewed this encounter.   Alejos Amsterdam, FNP 04/03/23 1930

## 2023-04-03 NOTE — ED Triage Notes (Signed)
 Abdominal pain started one week ago while on cruise.  Last bm yesterday-small amount and hard.  Patient is passing small amount of gas.  Abdomen is distended and bloated.  Patient urinates, but not as much as usual.

## 2023-04-03 NOTE — Discharge Instructions (Addendum)
 Take the MiraLAX and Colace.  We have checked some basic labs and we will contact you if anything requires urgent follow-up.  Seek immediate care if you develop any vomiting, no stool over the next 3 days, or any new concerning symptoms.  For moderate to severe constipation (not having a bowel movement in more than 3 days) then try to use an enema or Miralax once daily until you have a good bowel movement.  It is not a good idea to use an enema or laxatives daily. If you find you are doing this, then please follow up with a gastroenterologist. Otherwise, a medication you could use daily to help with promoting bowel movements is docusate (Colace) 100mg . It is okay to use this 1-2 times daily as a stool softener.  Try to stay active physically including regular exercise 2-3 times a week.  Make sure you hydrate well every day with about 64 ounces of water daily (that is 2 liters).  Try to avoid carb heavy foods, dairy. This includes cutting out breads, pasta, pizza, pastries, potatoes, rice, starchy foods in general. Eat more fiber as listed below:  Salads - kale, spinach, cabbage, spring mix, arugula Fruits - avocadoes, berries (blueberries, raspberries, blackberries), apples, oranges, pomegranate, grapefruit, kiwi Vegetables - asparagus, cauliflower, broccoli, green beans, brussel sprouts, bell peppers, beets; stay away from or limit starchy vegetables like potatoes, carrots, peas Other general foods - kidney beans, egg whites, almonds, walnuts, sunflower seeds, pumpkin seeds, fat free yogurt, almond milk, flax seeds, quinoa, oats  Meat - It is better to eat lean meats and limit your red meat including pork to once a week.  Wild caught fish, chicken breast are good options as they tend to be leaner sources of good protein. Still be mindful of the sodium labels for the meats you buy.  DO NOT EAT ANY FOODS ON THIS LIST THAT YOU ARE ALLERGIC TO. For more specific needs, I highly recommend consulting a  dietician or nutritionist but this can definitely be a good starting point.

## 2023-04-09 ENCOUNTER — Other Ambulatory Visit: Payer: Self-pay

## 2023-04-09 ENCOUNTER — Emergency Department (HOSPITAL_COMMUNITY): Payer: 59

## 2023-04-09 ENCOUNTER — Emergency Department (HOSPITAL_COMMUNITY)
Admission: EM | Admit: 2023-04-09 | Discharge: 2023-04-09 | Disposition: A | Discharge status: Home / Self Care | Payer: 59 | Attending: Emergency Medicine | Admitting: Emergency Medicine

## 2023-04-09 DIAGNOSIS — Z79899 Other long term (current) drug therapy: Secondary | ICD-10-CM | POA: Diagnosis not present

## 2023-04-09 DIAGNOSIS — R109 Unspecified abdominal pain: Secondary | ICD-10-CM

## 2023-04-09 DIAGNOSIS — K5900 Constipation, unspecified: Secondary | ICD-10-CM | POA: Diagnosis not present

## 2023-04-09 DIAGNOSIS — R7402 Elevation of levels of lactic acid dehydrogenase (LDH): Secondary | ICD-10-CM | POA: Insufficient documentation

## 2023-04-09 DIAGNOSIS — R140 Abdominal distension (gaseous): Secondary | ICD-10-CM | POA: Diagnosis not present

## 2023-04-09 DIAGNOSIS — R188 Other ascites: Secondary | ICD-10-CM | POA: Diagnosis not present

## 2023-04-09 DIAGNOSIS — K8020 Calculus of gallbladder without cholecystitis without obstruction: Secondary | ICD-10-CM | POA: Diagnosis not present

## 2023-04-09 DIAGNOSIS — R935 Abnormal findings on diagnostic imaging of other abdominal regions, including retroperitoneum: Secondary | ICD-10-CM | POA: Diagnosis not present

## 2023-04-09 LAB — COMPREHENSIVE METABOLIC PANEL
ALT: 35 U/L (ref 0–44)
AST: 92 U/L — ABNORMAL HIGH (ref 15–41)
Albumin: 2.1 g/dL — ABNORMAL LOW (ref 3.5–5.0)
Alkaline Phosphatase: 180 U/L — ABNORMAL HIGH (ref 38–126)
Anion gap: 15 (ref 5–15)
BUN: <5 mg/dL — ABNORMAL LOW (ref 6–20)
CO2: 22 mmol/L (ref 22–32)
Calcium: 8.5 mg/dL — ABNORMAL LOW (ref 8.9–10.3)
Chloride: 95 mmol/L — ABNORMAL LOW (ref 98–111)
Creatinine, Ser: 0.76 mg/dL (ref 0.61–1.24)
GFR, Estimated: >60 mL/min (ref >60)
Glucose, Bld: 84 mg/dL (ref 70–99)
Potassium: 4 mmol/L (ref 3.5–5.1)
Sodium: 132 mmol/L — ABNORMAL LOW (ref 135–145)
Total Bilirubin: 1.3 mg/dL — ABNORMAL HIGH (ref 0.0–1.2)
Total Protein: 7.2 g/dL (ref 6.5–8.1)

## 2023-04-09 LAB — CBC WITH DIFFERENTIAL/PLATELET
Abs Immature Granulocytes: 0.02 10*3/uL (ref 0.00–0.07)
Basophils Absolute: 0.1 10*3/uL (ref 0.0–0.1)
Basophils Relative: 1 %
Eosinophils Absolute: 0.1 10*3/uL (ref 0.0–0.5)
Eosinophils Relative: 2 %
HCT: 34.5 % — ABNORMAL LOW (ref 39.0–52.0)
Hemoglobin: 11.4 g/dL — ABNORMAL LOW (ref 13.0–17.0)
Immature Granulocytes: 0 %
Lymphocytes Relative: 34 %
Lymphs Abs: 1.7 10*3/uL (ref 0.7–4.0)
MCH: 32 pg (ref 26.0–34.0)
MCHC: 33 g/dL (ref 30.0–36.0)
MCV: 96.9 fL (ref 80.0–100.0)
Monocytes Absolute: 0.4 10*3/uL (ref 0.1–1.0)
Monocytes Relative: 7 %
Neutro Abs: 2.7 10*3/uL (ref 1.7–7.7)
Neutrophils Relative %: 56 %
Platelets: 215 10*3/uL (ref 150–400)
RBC: 3.56 MIL/uL — ABNORMAL LOW (ref 4.22–5.81)
RDW: 13.4 % (ref 11.5–15.5)
WBC: 4.9 10*3/uL (ref 4.0–10.5)
nRBC: 0 % (ref 0.0–0.2)

## 2023-04-09 LAB — URINALYSIS, W/ REFLEX TO CULTURE (INFECTION SUSPECTED)
Bacteria, UA: NONE SEEN
Bilirubin Urine: NEGATIVE
Glucose, UA: NEGATIVE mg/dL
Hgb urine dipstick: NEGATIVE
Ketones, ur: NEGATIVE mg/dL
Leukocytes,Ua: NEGATIVE
Nitrite: NEGATIVE
Protein, ur: NEGATIVE mg/dL
Specific Gravity, Urine: 1.028 (ref 1.005–1.030)
pH: 7 (ref 5.0–8.0)

## 2023-04-09 LAB — LIPASE, BLOOD: Lipase: 24 U/L (ref 11–51)

## 2023-04-09 LAB — I-STAT CG4 LACTIC ACID, ED
Lactic Acid, Venous: 3.2 mmol/L (ref 0.5–1.9)
Lactic Acid, Venous: 1.5 mmol/L (ref 0.5–1.9)

## 2023-04-09 MED ORDER — IOHEXOL 350 MG/ML SOLN
75.0000 mL | Freq: Once | INTRAVENOUS | Status: AC | PRN
  Administered 2023-04-09: 75 mL via INTRAVENOUS

## 2023-04-09 MED ORDER — LACTATED RINGERS IV BOLUS
1000.0000 mL | Freq: Once | INTRAVENOUS | Status: AC
  Administered 2023-04-09: 1000 mL via INTRAVENOUS

## 2023-04-09 NOTE — ED Provider Notes (Signed)
Care of patient received from prior provider at 9:41 PM, please see their note for complete H/P and care plan.  Received handoff per ED course.  Clinical Course as of 04/09/23 2141  Tue Apr 09, 2023  1505 Stable  65 YOM with abdominal pain. Difficulty with constipation. ETOH use. Pending CT. [CC]  1519 Patient signed out to Dr. Doran Durand pending CT and repeat lactic. [VK]    Clinical Course User Index [CC] Glyn Ade, MD [VK] Rexford Maus, DO    Reassessment: CT reassuring, lactic acid improved with IV fluids.  Patient ambulatory tolerating p.o. intake he feels comfortable with outpatient care management.    Disposition:  I have considered need for hospitalization, however, considering all of the above, I believe this patient is stable for discharge at this time.  Patient/family educated about specific return precautions for given chief complaint and symptoms.  Patient/family educated about follow-up with PCP.     Patient/family expressed understanding of return precautions and need for follow-up. Patient spoken to regarding all imaging and laboratory results and appropriate follow up for these results. All education provided in verbal form with additional information in written form. Time was allowed for answering of patient questions. Patient discharged.    Emergency Department Medication Summary:   Medications  lactated ringers bolus 1,000 mL (0 mLs Intravenous Stopped 04/09/23 1906)  iohexol (OMNIPAQUE) 350 MG/ML injection 75 mL (75 mLs Intravenous Contrast Given 04/09/23 1653)          Glyn Ade, MD 04/09/23 2141

## 2023-04-09 NOTE — ED Provider Notes (Signed)
[place] EMERGENCY DEPARTMENT AT [facility] Provider Note   CSN: [id number] Arrival date & time: 04/09/23  1043     History  Chief Complaint  Patient presents with   Constipation   Abdominal Pain   Bloated    Robert Carney is a 58 y.o. male.  Patient is a 58 year old male with history of alcohol use presenting to the emergency department with abdominal bloating.  Patient states that his abdomen has been bloated for about the last week.  He denies any associated nausea or vomiting.  He states that he is somewhat constipated but did still have a small bowel movement this morning and is passing gas.  He denies any associated fevers and states that he has still been eating and drinking normally.  He states that he has 1-3 beers a day and denies any history of alcohol withdrawal.  He denies any prior abdominal surgeries.  The history is provided by the patient.  Constipation Associated symptoms: abdominal pain   Abdominal Pain Associated symptoms: constipation        Home Medications Prior to Admission medications   Medication Sig Start Date End Date Taking? Authorizing Provider  folic acid (FOLVITE) 1 MG tablet Take 1 tablet (1 mg total) by mouth daily. 12/28/22  Yes Marcelino Duster, MD  loperamide (IMODIUM) 2 MG capsule Take 1 capsule (2 mg total) by mouth 4 (four) times daily as needed for diarrhea or loose stools. 12/19/22  Yes Carlisle Beers, FNP  metoprolol tartrate (LOPRESSOR) 25 MG tablet Take 0.5 tablets (12.5 mg total) by mouth 2 (two) times daily. Patient taking differently: Take 25 mg by mouth 2 (two) times daily. 04/18/22  Yes [name], NP  potassium chloride (KLOR-CON) 10 MEQ tablet Take 2 tablets (20 mEq total) by mouth daily. 12/19/22  Yes Carlisle Beers, FNP  thiamine (VITAMIN B-1) 100 MG tablet Take 1 tablet (100 mg total) by mouth daily. 12/28/22  Yes Marcelino Duster, MD  pantoprazole (PROTONIX) 40 MG tablet Take 1  tablet (40 mg total) by mouth daily. Patient not taking: Reported on 04/09/2023 12/27/22 04/09/23  Marcelino Duster, MD      Allergies    No known allergies    Review of Systems   Review of Systems  Gastrointestinal:  Positive for abdominal pain and constipation.    Physical Exam Updated Vital Signs BP (!) 137/90 (BP Location: Left Arm)   Pulse (!) 105   Temp (!) 97.5 F (36.4 C)   Resp (!) 22   Ht 6' (1.829 m)   SpO2 98%   BMI 17.39 kg/m  Physical Exam Vitals and nursing note reviewed.  Constitutional:      General: He is not in acute distress.    Appearance: He is well-developed.  HENT:     Head: Normocephalic and atraumatic.     Mouth/Throat:     Mouth: Mucous membranes are moist.  Eyes:     Extraocular Movements: Extraocular movements intact.  Cardiovascular:     Rate and Rhythm: Normal rate and regular rhythm.     Heart sounds: Normal heart sounds.  Pulmonary:     Effort: Pulmonary effort is normal.     Breath sounds: Normal breath sounds.  Abdominal:     General: Bowel sounds are normal. There is distension.     Palpations: Abdomen is soft.     Tenderness: There is no abdominal tenderness.  Skin:    General: Skin is warm and dry.  Neurological:  General: No focal deficit present.     Mental Status: He is alert and oriented to person, place, and time.  Psychiatric:        Mood and Affect: Mood normal.        Behavior: Behavior normal.     ED Results / Procedures / Treatments   Labs (all labs ordered are listed, but only abnormal results are displayed) Labs Reviewed  COMPREHENSIVE METABOLIC PANEL - Abnormal; Notable for the following components:      Result Value   Sodium 132 (*)    Chloride 95 (*)    BUN <5 (*)    Calcium 8.5 (*)    Albumin 2.1 (*)    AST 92 (*)    Alkaline Phosphatase 180 (*)    Total Bilirubin 1.3 (*)    All other components within normal limits  CBC WITH DIFFERENTIAL/PLATELET - Abnormal; Notable for the following  components:   RBC 3.56 (*)    Hemoglobin 11.4 (*)    HCT 34.5 (*)    All other components within normal limits  I-STAT CG4 LACTIC ACID, ED - Abnormal; Notable for the following components:   Lactic Acid, Venous 3.2 (*)    All other components within normal limits  LIPASE, BLOOD  URINALYSIS, W/ REFLEX TO CULTURE (INFECTION SUSPECTED)  I-STAT CG4 LACTIC ACID, ED    EKG None  Radiology DG Abd 1 View Result Date: 04/09/2023 CLINICAL DATA:  58 year old male with constipation, abdominal bloating and discomfort. EXAM: ABDOMEN - 1 VIEW COMPARISON:  Chest radiographs 12/25/2022 and earlier. FINDINGS: Two supine views of the abdomen and pelvis 1138 hours. Paucity of large bowel gas with increased small bowel gas compared to prior exams and some dilated mid abdominal small bowel loops (up to 35 mm). No significant retained stool. No evidence of pneumoperitoneum on these supine views. Widespread vertebral endplate osteophytes. Grossly negative lung bases. No acute osseous abnormality identified. IMPRESSION: Gas pattern suspicious for Small Bowel Obstruction, with ileus felt less likely. Paucity of large bowel gas, retained stool. Electronically Signed   By: Odessa Fleming M.D.   On: 04/09/2023 12:03    Procedures Procedures    Medications Ordered in ED Medications  lactated ringers bolus 1,000 mL (has no administration in time range)    ED Course/ Medical Decision Making/ A&P Clinical Course as of 04/09/23 1520  Tue Apr 09, 2023  1505 Stable  57 YOM with abdominal pain. Difficulty with constipation. ETOH use. Pending CT. [CC]  1519 Patient signed out to Dr. Doran Durand pending CT and repeat lactic. [VK]    Clinical Course User Index [CC] Glyn Ade, MD [VK] Rexford Maus, DO                                 Medical Decision Making This patient presents to the ED with chief complaint(s) of abdominal distention with pertinent past medical history of alcohol use disorder which  further complicates the presenting complaint. The complaint involves an extensive differential diagnosis and also carries with it a high risk of complications and morbidity.    The differential diagnosis includes obstruction, ileus, constipation, ascites, liver disease, bowel ischemia  Additional history obtained: Additional history obtained from N/a Records reviewed Primary Care Documents and urgent care records  ED Course and Reassessment: On patient's arrival he was tachycardic otherwise hemodynamically stable in no acute distress.  He was initially evaluated in triage and had labs and  abdominal x-ray performed.  His labs did show an elevated lactate of 3.2, otherwise are at his baseline without acute abnormality.  Abdominal x-ray showed possible SBO versus ileus.  The patient is distended on exam but has had no nausea or vomiting and is still having bowel movements and passing gas giving me a lower suspicion of obstruction and will have CT and abdomen and pelvis to further evaluate and differentiate.  He was given IV fluids and will have repeat lactic and will be closely reassessed.  Independent labs interpretation:  The following labs were independently interpreted: elevated lactic, otherwise within normal range  Independent visualization of imaging: - I independently visualized the following imaging with scope of interpretation limited to determining acute life threatening conditions related to emergency care: abd XR, which revealed possible SBO vs ileus    Amount and/or Complexity of Data Reviewed Labs: ordered. Radiology: ordered.          Final Clinical Impression(s) / ED Diagnoses Final diagnoses:  None    Rx / DC Orders ED Discharge Orders     None         Rexford Maus, DO 04/09/23 1520

## 2023-04-09 NOTE — ED Triage Notes (Signed)
Pt. Stated, I've got abdominal bloating and swelling cause I can't go to bathroom.

## 2023-04-10 ENCOUNTER — Encounter (INDEPENDENT_AMBULATORY_CARE_PROVIDER_SITE_OTHER): Payer: Self-pay | Admitting: Primary Care

## 2023-04-10 ENCOUNTER — Ambulatory Visit (INDEPENDENT_AMBULATORY_CARE_PROVIDER_SITE_OTHER): Payer: 59 | Admitting: Primary Care

## 2023-04-10 VITALS — BP 125/89 | HR 100 | Resp 16 | Ht 72.0 in | Wt 132.4 lb

## 2023-04-10 DIAGNOSIS — R1084 Generalized abdominal pain: Secondary | ICD-10-CM | POA: Diagnosis not present

## 2023-04-10 NOTE — Progress Notes (Unsigned)
Subjective:   Robert Carney is a 58 y.o. male presents for ED follow up 04/03/23 abdominal distension and 04/09/23 abdominal pain. He has a hx of Upper GI bleed with ESOPHAGOGASTRODUODENOSCOPY on 12/26/22. Today he c/o the same abdominal pain with distended abdomen. Past Medical History:  Diagnosis Date   Cervical myelopathy (HCC) 11/01/2016   Gait abnormality 11/01/2016   Hypertension    no longer taking meds     Allergies  Allergen Reactions   No Known Allergies     Current Outpatient Medications on File Prior to Visit  Medication Sig Dispense Refill   folic acid (FOLVITE) 1 MG tablet Take 1 tablet (1 mg total) by mouth daily. 30 tablet 2   loperamide (IMODIUM) 2 MG capsule Take 1 capsule (2 mg total) by mouth 4 (four) times daily as needed for diarrhea or loose stools. 12 capsule 0   metoprolol tartrate (LOPRESSOR) 25 MG tablet Take 0.5 tablets (12.5 mg total) by mouth 2 (two) times daily. (Patient taking differently: Take 25 mg by mouth 2 (two) times daily.) 180 tablet 1   pantoprazole (PROTONIX) 40 MG tablet Take 1 tablet (40 mg total) by mouth daily. (Patient not taking: Reported on 04/09/2023) 30 tablet 1   potassium chloride (KLOR-CON) 10 MEQ tablet Take 2 tablets (20 mEq total) by mouth daily. 2 tablet 0   thiamine (VITAMIN B-1) 100 MG tablet Take 1 tablet (100 mg total) by mouth daily. 30 tablet 2   No current facility-administered medications on file prior to visit.    Review of System: ROS Comprehensive ROS Pertinent positive and negative noted in HPI   Objective:  BP 125/89 (BP Location: Left Arm, Patient Position: Sitting, Cuff Size: Normal)   Pulse 100   Resp 16   Ht 6' (1.829 m)   Wt 132 lb 6.4 oz (60.1 kg)   SpO2 100%   BMI 17.96 kg/m   Filed Weights   04/10/23 1555  Weight: 132 lb 6.4 oz (60.1 kg)    Physical Exam Vitals reviewed.  Constitutional:      Appearance: Normal appearance.     Comments: Body mass index is 17.96 kg/m.   HENT:     Head:  Normocephalic.     Right Ear: Tympanic membrane and external ear normal.     Left Ear: Tympanic membrane and external ear normal.     Nose: Nose normal.  Cardiovascular:     Rate and Rhythm: Normal rate and regular rhythm.     Pulses: Normal pulses.     Heart sounds: Normal heart sounds.  Pulmonary:     Effort: Pulmonary effort is normal.     Breath sounds: Normal breath sounds.  Abdominal:     General: Abdomen is flat. Bowel sounds are normal.     Palpations: Abdomen is soft.  Musculoskeletal:        General: Normal range of motion.     Cervical back: Normal range of motion.  Skin:    General: Skin is dry.  Neurological:     General: No focal deficit present.     Mental Status: He is oriented to person, place, and time.      Assessment:  Robert Carney was seen today for hospitalization follow-up.  Diagnoses and all orders for this visit:  Generalized abdominal pain -     Ambulatory referral to Gastroenterology     This note has been created with Dragon speech recognition software and Paediatric nurse. Any transcriptional errors are unintentional.  No follow-ups on file.  [name], NP 04/11/2023, 10:19 PM

## 2023-04-11 ENCOUNTER — Other Ambulatory Visit: Payer: Self-pay

## 2023-04-11 ENCOUNTER — Telehealth (INDEPENDENT_AMBULATORY_CARE_PROVIDER_SITE_OTHER): Payer: Self-pay | Admitting: Primary Care

## 2023-04-11 NOTE — Telephone Encounter (Signed)
Called pt to see if they wanted to keep atp that was made for 1/24. Pt was just seen on 1/23 so I was calling to clairfly if they wanted to keep atp for 1/24. Please advise

## 2023-04-12 ENCOUNTER — Other Ambulatory Visit (INDEPENDENT_AMBULATORY_CARE_PROVIDER_SITE_OTHER): Payer: Self-pay | Admitting: Primary Care

## 2023-04-12 ENCOUNTER — Other Ambulatory Visit: Payer: Self-pay

## 2023-04-12 ENCOUNTER — Ambulatory Visit (INDEPENDENT_AMBULATORY_CARE_PROVIDER_SITE_OTHER): Payer: Medicaid Other | Admitting: Primary Care

## 2023-04-12 DIAGNOSIS — F1010 Alcohol abuse, uncomplicated: Secondary | ICD-10-CM

## 2023-04-12 DIAGNOSIS — R1084 Generalized abdominal pain: Secondary | ICD-10-CM | POA: Diagnosis not present

## 2023-04-12 DIAGNOSIS — Z09 Encounter for follow-up examination after completed treatment for conditions other than malignant neoplasm: Secondary | ICD-10-CM

## 2023-04-12 DIAGNOSIS — Z760 Encounter for issue of repeat prescription: Secondary | ICD-10-CM

## 2023-04-12 DIAGNOSIS — I10 Essential (primary) hypertension: Secondary | ICD-10-CM

## 2023-04-12 NOTE — Telephone Encounter (Signed)
Copied from CRM [id number]. Topic: Clinical - Medication Refill >> Apr 12, 2023  2:27 PM Yolanda T wrote: Most Recent Primary Care Visit:  Provider: [name]  Department: [facility]  Visit Type: OFFICE VISIT  Date: 04/12/2023  Medication:  folic acid (FOLVITE) 1 MG tablet  loperamide (IMODIUM) 2 MG capsule metoprolol tartrate (LOPRESSOR) 25 MG tablet pantoprazole (PROTONIX) 40 MG tablet potassium chloride (KLOR-CON) 10 MEQ tablet thiamine (VITAMIN B-1) 100 MG tablet    Has the patient contacted their pharmacy? No  Is this the correct pharmacy for this prescription? Yes If no, delete pharmacy and type the correct one.  This is the patient's preferred pharmacy:  [facility] [address], [address] [place] [address] Phone: [phone] Fax: [phone]  Has the prescription been filled recently? Yes  Is the patient out of the medication? Yes  Has the patient been seen for an appointment in the last year OR does the patient have an upcoming appointment? Yes  Can we respond through MyChart? No  Agent: Please be advised that Rx refills may take up to 3 business days. We ask that you follow-up with your pharmacy.

## 2023-04-12 NOTE — Telephone Encounter (Signed)
Last Fill: Folic Acid: 12/28/22     Loperamide: 12/19/22     Metoprolol: 04/18/22     Pantoprazole: 12/27/22     Potassium: 12/19/22     Thiamine: 12/28/22  Last OV: 04/11/20 Next OV: 07/15/23  Routing to provider for review/authorization.

## 2023-04-16 ENCOUNTER — Other Ambulatory Visit: Payer: Self-pay

## 2023-04-17 ENCOUNTER — Other Ambulatory Visit: Payer: Self-pay

## 2023-04-21 ENCOUNTER — Encounter (INDEPENDENT_AMBULATORY_CARE_PROVIDER_SITE_OTHER): Payer: Self-pay | Admitting: Primary Care

## 2023-04-21 NOTE — Progress Notes (Signed)
   Subjective:   Robert Carney is a 58 y.o. male presents for emergency room up .  He presented on 04/09/23, patient was abdominal bloating which she has had for at least a week.  He was having bowel movements but small no other associated or aggravating factors.  He still drinks beers at least 1-3 daily.  Discharged  on 04/09/23, constipation, abdominal pain and bloating.  Abdominal x-ray showed possible small bowel obstruction versus ileus.  Today he presents feeling better but still discomfort in abdomen will refer to GI.  Discharge notes indicate follow-up with PCP.  And specialists  Past Medical History:  Diagnosis Date   Cervical myelopathy (HCC) 11/01/2016   Gait abnormality 11/01/2016   Hypertension    no longer taking meds     Allergies  Allergen Reactions   No Known Allergies     Current Outpatient Medications on File Prior to Visit  Medication Sig Dispense Refill   folic acid (FOLVITE) 1 MG tablet Take 1 tablet (1 mg total) by mouth daily. 30 tablet 2   loperamide (IMODIUM) 2 MG capsule Take 1 capsule (2 mg total) by mouth 4 (four) times daily as needed for diarrhea or loose stools. 12 capsule 0   metoprolol tartrate (LOPRESSOR) 25 MG tablet Take 0.5 tablets (12.5 mg total) by mouth 2 (two) times daily. (Patient taking differently: Take 25 mg by mouth 2 (two) times daily.) 180 tablet 1   pantoprazole (PROTONIX) 40 MG tablet Take 1 tablet (40 mg total) by mouth daily. (Patient not taking: Reported on 04/09/2023) 30 tablet 1   potassium chloride (KLOR-CON) 10 MEQ tablet Take 2 tablets (20 mEq total) by mouth daily. 2 tablet 0   thiamine (VITAMIN B-1) 100 MG tablet Take 1 tablet (100 mg total) by mouth daily. 30 tablet 2   No current facility-administered medications on file prior to visit.    Review of System: ROS Comprehensive ROS Pertinent positive and negative noted in HPI   Objective:  .vs only noted weight 132 pounds Assessment:  Diagnoses and all orders for this  visit:  Hospital discharge follow-up See HPI  Generalized abdominal pain Refer to GI  Alcohol abuse Per patient only 1-2 beers per day and this is not the first thing he does when he wakes up later in the day does not feel that amount he is drinking is a problem     This note has been created with Education officer, environmental. Any transcriptional errors are unintentional.   Return in about 3 months (around 07/11/2023) for medical conditions.  [name], NP 04/21/2023, 9:07 PM

## 2023-04-22 ENCOUNTER — Ambulatory Visit (INDEPENDENT_AMBULATORY_CARE_PROVIDER_SITE_OTHER): Payer: 59 | Admitting: Primary Care

## 2023-04-25 ENCOUNTER — Encounter (INDEPENDENT_AMBULATORY_CARE_PROVIDER_SITE_OTHER): Payer: Self-pay | Admitting: *Deleted

## 2023-04-25 NOTE — Telephone Encounter (Signed)
 error

## 2023-04-25 NOTE — Telephone Encounter (Signed)
 Error

## 2023-05-07 ENCOUNTER — Other Ambulatory Visit: Payer: Self-pay

## 2023-05-17 ENCOUNTER — Ambulatory Visit: Payer: Self-pay | Admitting: Family Medicine

## 2023-05-22 ENCOUNTER — Encounter: Payer: Self-pay | Admitting: Primary Care

## 2023-05-29 ENCOUNTER — Encounter: Payer: Self-pay | Admitting: Family Medicine

## 2023-05-29 ENCOUNTER — Ambulatory Visit: Payer: 59 | Admitting: Family Medicine

## 2023-05-29 VITALS — BP 122/66 | HR 70 | Temp 98.3°F | Ht 72.0 in | Wt 119.0 lb

## 2023-05-29 DIAGNOSIS — D649 Anemia, unspecified: Secondary | ICD-10-CM

## 2023-05-29 DIAGNOSIS — R109 Unspecified abdominal pain: Secondary | ICD-10-CM | POA: Diagnosis not present

## 2023-05-29 DIAGNOSIS — K760 Fatty (change of) liver, not elsewhere classified: Secondary | ICD-10-CM | POA: Diagnosis not present

## 2023-05-29 DIAGNOSIS — R7989 Other specified abnormal findings of blood chemistry: Secondary | ICD-10-CM

## 2023-05-29 DIAGNOSIS — R5383 Other fatigue: Secondary | ICD-10-CM

## 2023-05-29 DIAGNOSIS — Z8719 Personal history of other diseases of the digestive system: Secondary | ICD-10-CM | POA: Insufficient documentation

## 2023-05-29 DIAGNOSIS — K8020 Calculus of gallbladder without cholecystitis without obstruction: Secondary | ICD-10-CM

## 2023-05-29 DIAGNOSIS — K209 Esophagitis, unspecified without bleeding: Secondary | ICD-10-CM

## 2023-05-29 DIAGNOSIS — G8929 Other chronic pain: Secondary | ICD-10-CM | POA: Diagnosis not present

## 2023-05-29 LAB — HEPATIC FUNCTION PANEL
ALT: 12 U/L (ref 0–53)
AST: 37 U/L (ref 0–37)
Albumin: 3.4 g/dL — ABNORMAL LOW (ref 3.5–5.2)
Alkaline Phosphatase: 96 U/L (ref 39–117)
Bilirubin, Direct: 0.1 mg/dL (ref 0.0–0.3)
Total Bilirubin: 0.4 mg/dL (ref 0.2–1.2)
Total Protein: 7.8 g/dL (ref 6.0–8.3)

## 2023-05-29 LAB — CBC
HCT: 38.7 % — ABNORMAL LOW (ref 39.0–52.0)
Hemoglobin: 12.8 g/dL — ABNORMAL LOW (ref 13.0–17.0)
MCHC: 33.1 g/dL (ref 30.0–36.0)
MCV: 93.5 fl (ref 78.0–100.0)
Platelets: 197 10*3/uL (ref 150.0–400.0)
RBC: 4.14 Mil/uL — ABNORMAL LOW (ref 4.22–5.81)
RDW: 13.3 % (ref 11.5–15.5)
WBC: 3.8 10*3/uL — ABNORMAL LOW (ref 4.0–10.5)

## 2023-05-29 LAB — BASIC METABOLIC PANEL WITH GFR
BUN: 8 mg/dL (ref 6–23)
CO2: 30 meq/L (ref 19–32)
Calcium: 9.1 mg/dL (ref 8.4–10.5)
Chloride: 97 meq/L (ref 96–112)
Creatinine, Ser: 0.56 mg/dL (ref 0.40–1.50)
GFR: 108.4 mL/min
Glucose, Bld: 86 mg/dL (ref 70–99)
Potassium: 4.4 meq/L (ref 3.5–5.1)
Sodium: 134 meq/L — ABNORMAL LOW (ref 135–145)

## 2023-05-29 LAB — VITAMIN B12: Vitamin B-12: 222 pg/mL (ref 211–911)

## 2023-05-29 LAB — FERRITIN: Ferritin: 395.5 ng/mL — ABNORMAL HIGH (ref 22.0–322.0)

## 2023-05-29 LAB — FOLATE: Folate: >25.2 ng/mL

## 2023-05-29 NOTE — Patient Instructions (Addendum)
 Please call to schedule with [facility]  [address] [place], [place] [address] PH# [phone]    We will be in touch with your lab results.   Continue your current medications.    Avoid alcohol.   Work on stopping smoking cigarettes and marijuana.

## 2023-05-29 NOTE — Progress Notes (Signed)
 New Patient Office Visit  Subjective    Patient ID: Robert Carney, male    DOB: [birth year]  Age: 58 y.o. MRN: [id number]  CC:  Chief Complaint  Patient presents with   Establish Care    Abdominal pain for a month    HPI Robert Carney presents to establish care  C/o intermittent abdominal pain and fatigue with hx of same. He was seen at his PCP office in January and referred to GI. He has not yet scheduled.   Reports having a normal bowel movement this morning. Denies seeing blood. No N/V/D.   States he stopped alcohol a few weeks ago. Previous PCP note in January states he was still drinking 1-3 beers per day.   Smokes cigarettes and marijuana   Lives alone.  Siblings are nearby  States he does not drive  Does not work.  Disabled     CT abdomen results:  IMPRESSION: Large volume ascites of unclear etiology.   Diffuse low-density throughout the liver compatible with fatty infiltration.   Cholelithiasis.   Areas of apparent wall thickening in the right colon and transverse colon, but colon is decompressed and difficult to evaluate. No evidence of bowel obstruction.   Trace bilateral pleural effusions, bibasilar atelectasis.   Circumferential wall thickening in the visualized distal esophagus suggesting esophagitis.     Electronically Signed   By: Charlett Nose M.D.   On: 04/09/2023 17:09    Outpatient Encounter Medications as of 05/29/2023  Medication Sig   Cyanocobalamin (B-12 PO) Take by mouth.   potassium chloride (KLOR-CON) 10 MEQ tablet Take 2 tablets (20 mEq total) by mouth daily.   [DISCONTINUED] folic acid (FOLVITE) 1 MG tablet Take 1 tablet (1 mg total) by mouth daily.   [DISCONTINUED] loperamide (IMODIUM) 2 MG capsule Take 1 capsule (2 mg total) by mouth 4 (four) times daily as needed for diarrhea or loose stools.   [DISCONTINUED] metoprolol tartrate (LOPRESSOR) 25 MG tablet Take 0.5 tablets (12.5 mg total) by mouth 2 (two) times daily.  (Patient taking differently: Take 25 mg by mouth 2 (two) times daily.)   [DISCONTINUED] thiamine (VITAMIN B-1) 100 MG tablet Take 1 tablet (100 mg total) by mouth daily.   [DISCONTINUED] pantoprazole (PROTONIX) 40 MG tablet Take 1 tablet (40 mg total) by mouth daily. (Patient not taking: Reported on 05/29/2023)   No facility-administered encounter medications on file as of 05/29/2023.    Past Medical History:  Diagnosis Date   Cervical myelopathy (HCC) 11/01/2016   Gait abnormality 11/01/2016   Hypertension    no longer taking meds    Past Surgical History:  Procedure Laterality Date   ANTERIOR CERVICAL CORPECTOMY N/A 03/26/2016   Procedure: Cervical  Seven Anterior cervical corpectomy;  Surgeon: [name], MD;  Location: [facility] OR;  Service: Neurosurgery;  Laterality: N/A;  Cervical  Seven Anterior cervical corpectomy   ESOPHAGOGASTRODUODENOSCOPY (EGD) WITH PROPOFOL N/A 12/26/2022   Procedure: ESOPHAGOGASTRODUODENOSCOPY (EGD) WITH PROPOFOL;  Surgeon: Sherrilyn Rist, MD;  Location: [facility];  Service: Gastroenterology;  Laterality: N/A;   tooth pulled      Family History  Problem Relation Age of Onset   Diabetes Mother    Lung cancer Mother    Hypertension Mother    Prostate cancer Father    Hypertension Father    Breast cancer Sister    Breast cancer Maternal Aunt    Colon cancer Neg Hx    Colon polyps Neg Hx    Esophageal  cancer Neg Hx    Rectal cancer Neg Hx    Stomach cancer Neg Hx     Social History   Socioeconomic History   Marital status: Single    Spouse name: Not on file   Number of children: 4   Years of education: 12   Highest education level: Not on file  Occupational History   Occupation: [employer]   Tobacco Use   Smoking status: Some Days    Current packs/day: 0.20    Types: Cigarettes   Smokeless tobacco: Never  Vaping Use   Vaping status: Never Used  Substance and Sexual Activity   Alcohol use: Yes    Alcohol/week:  42.0 standard drinks of alcohol    Types: 42 Cans of beer per week    Comment: Daily beer drinker   Drug use: Yes    Types: Marijuana    Comment: No history of IV drug use.   Sexual activity: Not on file  Other Topics Concern   Not on file  Social History Narrative   Lives with mother, Kathie Rhodes   Caffeine use: sometimes   Right handed   Social Drivers of Health   Financial Resource Strain: Not on file  Food Insecurity: No Food Insecurity (04/10/2023)   Hunger Vital Sign    Worried About Running Out of Food in the Last Year: Never true    Ran Out of Food in the Last Year: Never true  Transportation Needs: No Transportation Needs (04/10/2023)   PRAPARE - Administrator, Civil Service (Medical): No    Lack of Transportation (Non-Medical): No  Physical Activity: Not on file  Stress: Not on file  Social Connections: Not on file  Intimate Partner Violence: Not At Risk (04/10/2023)   Humiliation, Afraid, Rape, and Kick questionnaire    Fear of Current or Ex-Partner: No    Emotionally Abused: No    Physically Abused: No    Sexually Abused: No    Review of Systems  Constitutional:  Positive for weight loss. Negative for chills, fever and malaise/fatigue.  Respiratory:  Negative for shortness of breath.   Cardiovascular:  Negative for chest pain, palpitations and leg swelling.  Gastrointestinal:  Positive for abdominal pain. Negative for constipation, diarrhea, nausea and vomiting.  Genitourinary:  Negative for dysuria, frequency and urgency.  Musculoskeletal:  Negative for falls, joint pain and myalgias.  Neurological:  Negative for dizziness, focal weakness and headaches.  Psychiatric/Behavioral:  Negative for depression and suicidal ideas. The patient is not nervous/anxious.         Objective    BP 122/66 (BP Location: Left Arm, Patient Position: Sitting, Cuff Size: Normal)   Pulse 70   Temp 98.3 F (36.8 C) (Temporal)   Ht 6' (1.829 m)   Wt 119 lb (54 kg)    SpO2 99%   BMI 16.14 kg/m   Physical Exam Constitutional:      General: He is not in acute distress.    Appearance: He is not ill-appearing.  HENT:     Mouth/Throat:     Mouth: Mucous membranes are moist.  Eyes:     Extraocular Movements: Extraocular movements intact.     Conjunctiva/sclera: Conjunctivae normal.  Cardiovascular:     Rate and Rhythm: Normal rate and regular rhythm.  Pulmonary:     Effort: Pulmonary effort is normal.     Breath sounds: Normal breath sounds.  Abdominal:     General: Bowel sounds are normal. There is no  distension.     Palpations: Abdomen is soft.     Tenderness: There is no abdominal tenderness. There is no guarding or rebound.  Musculoskeletal:     Cervical back: Normal range of motion and neck supple. No tenderness.     Right lower leg: No edema.     Left lower leg: No edema.  Lymphadenopathy:     Cervical: No cervical adenopathy.  Skin:    General: Skin is warm and dry.  Neurological:     General: No focal deficit present.     Mental Status: He is alert and oriented to person, place, and time.     Motor: No weakness.     Coordination: Coordination normal.     Gait: Gait normal.  Psychiatric:        Mood and Affect: Mood normal.        Behavior: Behavior normal.        Thought Content: Thought content normal.         Assessment & Plan:   Problem List Items Addressed This Visit     Abnormal LFTs   Relevant Orders   Basic metabolic panel (Completed)   Hepatic function panel (Completed)   Calculus of gallbladder without cholecystitis without obstruction   Chronic abdominal pain - Primary   Esophagitis   Hepatic steatosis   History of GI bleed   Normocytic anemia   Relevant Medications   Cyanocobalamin (B-12 PO)   Other Relevant Orders   CBC (Completed)   Folate (Completed)   Ferritin (Completed)   Vitamin B12 (Completed)   Vitamin B1   Other Visit Diagnoses       Fatigue, unspecified type       Relevant Orders    CBC (Completed)   Basic metabolic panel (Completed)   Hepatic function panel (Completed)   Folate (Completed)   Ferritin (Completed)   Vitamin B12 (Completed)   Vitamin B1      Here to establish care. He is aware that he was already established with [facility] NP. He wishes to proceed with the visit.  Here with c/o chronic abdominal pain with abnormal findings on CT abdomen in January 2025. No red flag symptoms today.  He has been referred to [facility] and advised him to call to schedule.  Counseling on stopping smoking to improve health  Avoid alcohol and NSAIDs Check labs. He reports taking supplements including folic acid, B1, B12. I will check levels and refill medications.   Return for pending labs.   Hetty Blend, [name]

## 2023-05-31 ENCOUNTER — Other Ambulatory Visit (INDEPENDENT_AMBULATORY_CARE_PROVIDER_SITE_OTHER): Payer: Self-pay | Admitting: Primary Care

## 2023-05-31 ENCOUNTER — Telehealth: Payer: Self-pay | Admitting: Family Medicine

## 2023-05-31 ENCOUNTER — Other Ambulatory Visit: Payer: Self-pay

## 2023-05-31 ENCOUNTER — Telehealth (INDEPENDENT_AMBULATORY_CARE_PROVIDER_SITE_OTHER): Payer: Self-pay

## 2023-05-31 ENCOUNTER — Telehealth (INDEPENDENT_AMBULATORY_CARE_PROVIDER_SITE_OTHER): Payer: Self-pay | Admitting: Primary Care

## 2023-05-31 DIAGNOSIS — I10 Essential (primary) hypertension: Secondary | ICD-10-CM

## 2023-05-31 DIAGNOSIS — Z760 Encounter for issue of repeat prescription: Secondary | ICD-10-CM

## 2023-05-31 MED ORDER — METOPROLOL TARTRATE 25 MG PO TABS
12.5000 mg | ORAL_TABLET | Freq: Two times a day (BID) | ORAL | 0 refills | Status: DC
  Filled 2023-05-31 – 2023-07-29 (×2): qty 90, 90d supply, fill #0

## 2023-05-31 MED ORDER — FOLIC ACID 1 MG PO TABS
1.0000 mg | ORAL_TABLET | Freq: Every day | ORAL | 0 refills | Status: DC
  Filled 2023-05-31: qty 30, 30d supply, fill #0
  Filled 2023-07-03: qty 30, 30d supply, fill #1
  Filled 2023-07-29: qty 30, 30d supply, fill #2

## 2023-05-31 MED ORDER — LOPERAMIDE HCL 2 MG PO CAPS
2.0000 mg | ORAL_CAPSULE | Freq: Four times a day (QID) | ORAL | 0 refills | Status: DC | PRN
  Filled 2023-05-31: qty 12, 3d supply, fill #0

## 2023-05-31 MED ORDER — PANTOPRAZOLE SODIUM 40 MG PO TBEC
DELAYED_RELEASE_TABLET | ORAL | 0 refills | Status: DC
  Filled 2023-05-31: qty 90, 90d supply, fill #0

## 2023-05-31 MED ORDER — VITAMIN B-1 100 MG PO TABS
100.0000 mg | ORAL_TABLET | Freq: Every day | ORAL | 0 refills | Status: DC
  Filled 2023-05-31: qty 30, 30d supply, fill #0
  Filled 2023-07-03: qty 30, 30d supply, fill #1
  Filled 2023-07-29: qty 30, 30d supply, fill #2

## 2023-05-31 NOTE — Telephone Encounter (Signed)
 Copied from CRM [id number]. Topic: General - Other >> May 31, 2023  2:04 PM Truddie Crumble wrote: Reason for CRM: patient called stating he is returning a call and and he want to keep NP Beather Arbour

## 2023-05-31 NOTE — Telephone Encounter (Signed)
 Called pt to confirm which Primary dr he wants to keep. He just established care with another provider, I am just checking on which he wants to keep.

## 2023-05-31 NOTE — Telephone Encounter (Signed)
 Copied from CRM [id number]. Topic: Clinical - Medication Refill >> May 31, 2023 10:52 AM Turkey A wrote: Most Recent Primary Care Visit:  Provider: [name]  Department: [facility]  Visit Type: OFFICE VISIT  Date: 04/12/2023  Medication: Cyanocobalamin (B-12 PO); folic acid (FOLVITE) 1 MG tablet;loperamide (IMODIUM) 2 MG capsule metoprolol tartrate (LOPRESSOR) 25 MG tablet; pantoprazole (PROTONIX) 40 MG tablet; potassium chloride (KLOR-CON) 10 MEQ tablet; thiamine (VITAMIN B-1) 100 MG tablet    Has the patient contacted their pharmacy? Yes (Agent: If no, request that the patient contact the pharmacy for the refill. If patient does not wish to contact the pharmacy document the reason why and proceed with request.) (Agent: If yes, when and what did the pharmacy advise?)  Is this the correct pharmacy for this prescription? Yes If no, delete pharmacy and type the correct one.  This is the patient's preferred pharmacy:  [facility] - [facility] [address], [address] [place] [address] Phone: [phone] Fax: [phone]  [id number] [place], [place] - [address] AT [facility] [address] [place] [address] Phone: [phone] Fax: [phone]  [facility] [phone] - [place], [place] - [address] AT [facility] [address] [place] [address] [place] [address] Phone: [phone] Fax: [phone]   Has the prescription been filled recently? No  Is the patient out of the medication? Yes  Has the patient been seen for an appointment in the last year OR does the patient have an upcoming appointment? Yes  Can we respond through MyChart? Yes  Agent: Please be advised that Rx refills may take up to 3 business days. We ask that you follow-up with your pharmacy.

## 2023-05-31 NOTE — Telephone Encounter (Signed)
 Patient called to verity the pharmacy to send the medications to and he says [facility]. He asked when will they be ready, advised it can take up to 72 hours. He says he will call on Monday and keep calling to check.  [facility] - [facility] [address], [address] [place] [address] Phone: [phone] Fax: [phone]

## 2023-05-31 NOTE — Telephone Encounter (Signed)
 Robert Carney a staff message that states "Pt has an appt with Marcelino Duster for 07/15/23. Pt established care with Marcelino Duster on 04/12/23. Pt then established care on 05/29/23 at [facility] [place]. Pt is not able to have 2 primary cares. He will need to decide which provider he wants to stay with"  Pt called back with his response.

## 2023-05-31 NOTE — Telephone Encounter (Signed)
 Rx sent, pt can get vitmain B12 OTC. Potassium was fine in labs, no need for refill .

## 2023-05-31 NOTE — Telephone Encounter (Signed)
 Ok to refill all pt medications?

## 2023-05-31 NOTE — Addendum Note (Signed)
 Addended by: Marinus Maw on: 05/31/2023 02:21 PM   Modules accepted: Orders

## 2023-06-01 ENCOUNTER — Encounter: Payer: Self-pay | Admitting: Family Medicine

## 2023-06-03 ENCOUNTER — Other Ambulatory Visit: Payer: Self-pay

## 2023-06-03 LAB — VITAMIN B1: Vitamin B1 (Thiamine): 76 nmol/L — ABNORMAL HIGH (ref 8–30)

## 2023-06-05 ENCOUNTER — Telehealth: Payer: Self-pay

## 2023-06-05 DIAGNOSIS — Z8719 Personal history of other diseases of the digestive system: Secondary | ICD-10-CM

## 2023-06-05 DIAGNOSIS — G8929 Other chronic pain: Secondary | ICD-10-CM

## 2023-06-05 NOTE — Telephone Encounter (Signed)
 Copied from CRM [id number]. Topic: Referral - Status >> Jun 04, 2023  2:53 PM Adele Barthel wrote: Reason for CRM:   Patient was advised by provider that a referral to GI would be submitted at his last visit with her on 05/29/2023. Reviewed chart and advised a referral for GI was placed in 04/21/2023, and was denied. This referral was placed by his previous primary doctor. He is requesting a status of his current referral that is being submitted by his provider.   CB# [phone]

## 2023-06-05 NOTE — Telephone Encounter (Signed)
 Called pt and advised new GI referral placed and they will call him to schedule once authorized. Pt verbalized understanding

## 2023-07-03 ENCOUNTER — Other Ambulatory Visit: Payer: Self-pay

## 2023-07-15 ENCOUNTER — Ambulatory Visit (INDEPENDENT_AMBULATORY_CARE_PROVIDER_SITE_OTHER): Payer: 59 | Admitting: Primary Care

## 2023-07-29 ENCOUNTER — Other Ambulatory Visit: Payer: Self-pay

## 2023-08-08 ENCOUNTER — Ambulatory Visit (INDEPENDENT_AMBULATORY_CARE_PROVIDER_SITE_OTHER)

## 2023-08-08 VITALS — Ht 72.0 in | Wt 119.0 lb

## 2023-08-08 DIAGNOSIS — Z0100 Encounter for examination of eyes and vision without abnormal findings: Secondary | ICD-10-CM

## 2023-08-08 DIAGNOSIS — Z0000 Encounter for general adult medical examination without abnormal findings: Secondary | ICD-10-CM | POA: Diagnosis not present

## 2023-08-08 DIAGNOSIS — Z1159 Encounter for screening for other viral diseases: Secondary | ICD-10-CM | POA: Diagnosis not present

## 2023-08-08 NOTE — Patient Instructions (Addendum)
 [name] , Thank you for taking time out of your busy schedule to complete your Annual Wellness Visit with me. I enjoyed our conversation and look forward to speaking with you again next year. I, as well as your care team,  appreciate your ongoing commitment to your health goals. Please review the following plan we discussed and let me know if I can assist you in the future. Your Game plan/ To Do List    Referrals: If you haven't heard from the office you've been referred to, please reach out to them at the phone provided.  Referral to Dr Ambrosio Junker (Ophthalmologist) for a routine eye exam.  Ordered a Hepatitis C Screening (lab). Follow up Visits: Next Medicare AWV with our clinical staff: 08/11/2024   Have you seen your provider in the last 6 months (3 months if uncontrolled diabetes)? Yes Next Office Visit with your provider: 12/10/2023  Clinician Recommendations:  Aim for 30 minutes of exercise or brisk walking, 6-8 glasses of water, and 5 servings of fruits and vegetables each day. Educated and advised on getting the Pneumonia and COVID vaccines in 2025.      This is a list of the screening recommended for you and due dates:  Health Maintenance  Topic Date Due   COVID-19 Vaccine (1) Never done   Pneumococcal Vaccination (1 of 2 - PCV) Never done   Zoster (Shingles) Vaccine (1 of 2) 08/29/2023*   Flu Shot  10/18/2023   Stool Blood Test  12/25/2023   Cologuard (Stool DNA test)  02/07/2024   Medicare Annual Wellness Visit  08/07/2024   DTaP/Tdap/Td vaccine (2 - Td or Tdap) 10/27/2028   Hepatitis C Screening  Completed   HIV Screening  Completed   HPV Vaccine  Aged Out   Meningitis B Vaccine  Aged Out   Colon Cancer Screening  Discontinued  *Topic was postponed. The date shown is not the original due date.    Advanced directives: (Declined) Advance directive discussed with you today. Even though you declined this today, please call our office should you change your mind, and we can give  you the proper paperwork for you to fill out. Advance Care Planning is important because it:  [x]  Makes sure you receive the medical care that is consistent with your values, goals, and preferences  [x]  It provides guidance to your family and loved ones and reduces their decisional burden about whether or not they are making the right decisions based on your wishes.  Follow the link provided in your after visit summary or read over the paperwork we have mailed to you to help you started getting your Advance Directives in place. If you need assistance in completing these, please reach out to us  so that we can help you!

## 2023-08-08 NOTE — Progress Notes (Signed)
 Subjective:   Robert Carney is a 58 y.o. who presents for a Medicare Wellness preventive visit.  As a reminder, Annual Wellness Visits don't include a physical exam, and some assessments may be limited, especially if this visit is performed virtually. We may recommend an in-person follow-up visit with your provider if needed.  Visit Complete: Virtual I connected with  Robert Carney on 08/08/23 by a audio enabled telemedicine application and verified that I am speaking with the correct person using two identifiers.  Patient Location: Home  Provider Location: Office/Clinic  I discussed the limitations of evaluation and management by telemedicine. The patient expressed understanding and agreed to proceed.  Vital Signs: Because this visit was a virtual/telehealth visit, some criteria may be missing or patient reported. Any vitals not documented were not able to be obtained and vitals that have been documented are patient reported.  VideoDeclined- This patient declined Librarian, academic. Therefore the visit was completed with audio only.  Persons Participating in Visit: Patient.  AWV Questionnaire: No: Patient Medicare AWV questionnaire was not completed prior to this visit.  Cardiac Risk Factors include: advanced age (>57men, >67 women);male gender;hypertension;smoking/ tobacco exposure (current smoker)     Objective:     Today's Vitals   08/08/23 0938  Weight: 119 lb (54 kg)  Height: 6' (1.829 m)   Body mass index is 16.14 kg/m.     08/08/2023    9:38 AM 04/09/2023   11:07 AM 12/26/2022    8:33 AM 12/26/2022    3:18 AM 12/25/2022    2:24 PM 10/16/2018    8:47 AM 12/20/2016    9:32 AM  Advanced Directives  Does Patient Have a Medical Advance Directive? No No No No No No No  Would patient like information on creating a medical advance directive? No - Patient declined    No - Patient declined Yes (ED - Information included in AVS) Yes  (MAU/Ambulatory/Procedural Areas - Information given)    Current Medications (verified) Outpatient Encounter Medications as of 08/08/2023  Medication Sig   Cyanocobalamin  (B-12 PO) Take by mouth.   folic acid  (FOLVITE ) 1 MG tablet Take 1 tablet (1 mg total) by mouth daily.   loperamide  (IMODIUM ) 2 MG capsule Take 1 capsule (2 mg total) by mouth 4 (four) times daily as needed for diarrhea or loose stools.   metoprolol  tartrate (LOPRESSOR ) 25 MG tablet Take 0.5 tablets (12.5 mg total) by mouth 2 (two) times daily.   pantoprazole  (PROTONIX ) 40 MG tablet Take 1 tablet (40 mg total) by mouth daily.   potassium chloride  (KLOR-CON ) 10 MEQ tablet Take 2 tablets (20 mEq total) by mouth daily.   thiamine  (VITAMIN B-1) 100 MG tablet Take 1 tablet (100 mg total) by mouth daily.   No facility-administered encounter medications on file as of 08/08/2023.    Allergies (verified) No known allergies   History: Past Medical History:  Diagnosis Date   Cervical myelopathy (HCC) 11/01/2016   Gait abnormality 11/01/2016   Hypertension    no longer taking meds   Past Surgical History:  Procedure Laterality Date   ANTERIOR CERVICAL CORPECTOMY N/A 03/26/2016   Procedure: Cervical  Seven Anterior cervical corpectomy;  Surgeon: Raelene Bullocks Ditty, MD;  Location: [facility] OR;  Service: Neurosurgery;  Laterality: N/A;  Cervical  Seven Anterior cervical corpectomy   ESOPHAGOGASTRODUODENOSCOPY (EGD) WITH PROPOFOL  N/A 12/26/2022   Procedure: ESOPHAGOGASTRODUODENOSCOPY (EGD) WITH PROPOFOL ;  Surgeon: Albertina Hugger, MD;  Location: [facility] ENDOSCOPY;  Service:  Gastroenterology;  Laterality: N/A;   tooth pulled     Family History  Problem Relation Age of Onset   Diabetes Mother    Lung cancer Mother    Hypertension Mother    Prostate cancer Father    Hypertension Father    Breast cancer Sister    Breast cancer Maternal Aunt    Colon cancer Neg Hx    Colon polyps Neg Hx    Esophageal cancer Neg Hx    Rectal cancer Neg  Hx    Stomach cancer Neg Hx    Social History   Socioeconomic History   Marital status: Single    Spouse name: Not on file   Number of children: 4   Years of education: 12   Highest education level: Not on file  Occupational History   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   Sexual activity: Yes  Other Topics Concern   Not on file  Social History Narrative   Lives with mother, Ninette [place]   Caffeine use: sometimes   Right handed   Social Drivers of Health   Financial Resource Strain: Low Risk  (08/08/2023)   Overall Financial Resource Strain (CARDIA)    Difficulty of Paying Living Expenses: Not hard at all  Food Insecurity: No Food Insecurity (08/08/2023)   Hunger Vital Sign    Worried About Running Out of Food in the Last Year: Never true    Ran Out of Food in the Last Year: Never true  Transportation Needs: No Transportation Needs (08/08/2023)   PRAPARE - Administrator, Civil Service (Medical): No    Lack of Transportation (Non-Medical): No  Physical Activity: Insufficiently Active (08/08/2023)   Exercise Vital Sign    Days of Exercise per Week: 3 days    Minutes of Exercise per Session: 40 min  Stress: No Stress Concern Present (08/08/2023)   [employer] of Occupational Health - Occupational Stress Questionnaire    Feeling of Stress : Not at all  Social Connections: Moderately Integrated (08/08/2023)   Social Connection and Isolation Panel  [NHANES]    Frequency of Communication with Friends and Family: More than three times a week    Frequency of Social Gatherings with Friends and Family: More than three times a week    Attends Religious Services: More than 4 times per year    Active Member of [employer] or Organizations: Yes    Attends Engineer, structural: More than 4 times per year    Marital Status: Never married    Tobacco Counseling Ready to quit: No Counseling given: Yes    Clinical Intake:  Pre-visit preparation completed: Yes  Pain : No/denies pain     BMI - recorded: 16.14 Nutritional Risks: Unintentional weight loss Diabetes: No  Lab Results  Component Value Date   HGBA1C 5.2  05/17/2020     How often do you need to have someone help you when you read instructions, pamphlets, or other written materials from your doctor or pharmacy?: 1 - Never  Interpreter Needed?: No  Information entered by :: Kandy Orris, CMA   Activities of Daily Living     08/08/2023    9:42 AM 12/25/2022    2:24 PM  In your present state of health, do you have any difficulty performing the following activities:  Hearing? 0 0  Vision? 1 0  Comment referral to an Opthalmologist   Difficulty concentrating or making decisions? 0 0  Walking or climbing stairs? 0   Dressing or bathing? 0   Doing errands, shopping? 0 0  Preparing Food and eating ? N   Using the Toilet? N   In the past six months, have you accidently leaked urine? N   Do you have problems with loss of bowel control? N   Managing your Medications? N   Managing your Finances? N   Housekeeping or managing your Housekeeping? N     Patient Care Team: Abram Abraham, [name] as PCP - General (Family Medicine)  Indicate any recent Medical Services you may have received from other than [facility] providers in the past year (date may be approximate).     Assessment:    This is a routine wellness examination for Robert Carney.  Hearing/Vision screen Hearing  Screening - Comments:: Denies hearing difficulties   Vision Screening - Comments:: Wears eyeglasses for reading only - referral to Dr Ambrosio Junker   Goals Addressed               This Visit's Progress     Patient Stated (pt-stated)        Patient stated he will continue exercising       Depression Screen     08/08/2023    9:46 AM 04/10/2023    4:00 PM 01/10/2023    1:46 PM 04/18/2022    3:30 PM 10/02/2021    8:41 AM 04/04/2021    2:59 PM 12/20/2020   10:28 AM  PHQ 2/9 Scores  PHQ - 2 Score 0 1 1 2  1 1   PHQ- 9 Score 0  2 5     Exception Documentation     Patient refusal      Fall Risk     08/08/2023    9:44 AM 01/10/2023    1:46 PM 04/18/2022    3:30 PM 10/02/2021    8:41 AM 04/04/2021    2:59 PM  Fall Risk   Falls in the past year? 1 1 0 0 0  Number falls in past yr: 0 1 0    Comment 2      Injury with Fall? 0 0 0    Risk for fall due to : Impaired balance/gait;History of fall(s)  No Fall Risks    Follow up Falls evaluation completed;Falls prevention discussed        MEDICARE RISK AT HOME:  Medicare Risk at Home Any stairs in or around the home?: Yes If so, are there any without handrails?: No Home free of loose throw rugs in walkways, pet beds, electrical cords, etc?: Yes Adequate lighting in your home to reduce risk of falls?: Yes Life alert?: No Use of a cane, walker or w/c?: Yes (cane) Grab bars in the bathroom?: Yes Shower chair or bench in shower?: Yes Elevated toilet seat or a handicapped toilet?: No  TIMED UP AND GO:  Was  the test performed?  No  Cognitive Function: 6CIT completed        08/08/2023    9:49 AM  6CIT Screen  What Year? 0 points  What month? 0 points  What time? 0 points  Count back from 20 0 points  Months in reverse 0 points  Repeat phrase 2 points  Total Score 2 points    Immunizations Immunization History  Administered Date(s) Administered   Influenza,inj,Quad PF,6+ Mos 11/18/2018, 05/17/2020, 12/06/2020   Tdap 10/28/2018     Screening Tests Health Maintenance  Topic Date Due   COVID-19 Vaccine (1) Never done   Pneumococcal Vaccine 11-67 Years old (1 of 2 - PCV) Never done   Zoster Vaccines- Shingrix  (1 of 2) 08/29/2023 (Originally 07/28/1984)   INFLUENZA VACCINE  10/18/2023   COLON CANCER SCREENING ANNUAL FOBT  12/25/2023   Fecal DNA (Cologuard)  02/07/2024   Medicare Annual Wellness (AWV)  08/07/2024   DTaP/Tdap/Td (2 - Td or Tdap) 10/27/2028   Hepatitis C Screening  Completed   HIV Screening  Completed   HPV VACCINES  Aged Out   Meningococcal B Vaccine  Aged Out   Colonoscopy  Discontinued    Health Maintenance  Health Maintenance Due  Topic Date Due   COVID-19 Vaccine (1) Never done   Pneumococcal Vaccine 34-50 Years old (1 of 2 - PCV) Never done   Health Maintenance Items Addressed: Referral sent to Optometry/Ophthalmology, Hepatitis C Screening  Additional Screening:  Vision Screening: Recommended annual ophthalmology exams for early detection of glaucoma and other disorders of the eye.  Dental Screening: Recommended annual dental exams for proper oral hygiene  Community Resource Referral / Chronic Care Management: CRR required this visit?  No   CCM required this visit?  No   Plan:    I have personally reviewed and noted the following in the patient's chart:   Medical and social history Use of alcohol, tobacco or illicit drugs  Current medications and supplements including opioid prescriptions. Patient is not currently taking opioid prescriptions. Functional ability and status Nutritional status Physical activity Advanced directives List of other physicians Hospitalizations, surgeries, and ER visits in previous 12 months Vitals Screenings to include cognitive, depression, and falls Referrals and appointments  In addition, I have reviewed and discussed with patient certain preventive protocols, quality metrics, and best practice recommendations. A written personalized  care plan for preventive services as well as general preventive health recommendations were provided to patient.   Patria Bookbinder, CMA   08/08/2023   After Visit Summary: (Declined) Due to this being a telephonic visit, with patients personalized plan was offered to patient but patient Declined AVS at this time   Notes: Nothing significant to report at this time.

## 2023-08-14 ENCOUNTER — Other Ambulatory Visit (INDEPENDENT_AMBULATORY_CARE_PROVIDER_SITE_OTHER)

## 2023-08-14 ENCOUNTER — Ambulatory Visit (INDEPENDENT_AMBULATORY_CARE_PROVIDER_SITE_OTHER): Admitting: Gastroenterology

## 2023-08-14 ENCOUNTER — Encounter: Payer: Self-pay | Admitting: Gastroenterology

## 2023-08-14 VITALS — BP 124/78 | HR 95 | Ht 72.0 in | Wt 122.0 lb

## 2023-08-14 DIAGNOSIS — R188 Other ascites: Secondary | ICD-10-CM

## 2023-08-14 DIAGNOSIS — K760 Fatty (change of) liver, not elsewhere classified: Secondary | ICD-10-CM

## 2023-08-14 DIAGNOSIS — F1090 Alcohol use, unspecified, uncomplicated: Secondary | ICD-10-CM | POA: Diagnosis not present

## 2023-08-14 DIAGNOSIS — F1010 Alcohol abuse, uncomplicated: Secondary | ICD-10-CM

## 2023-08-14 LAB — CBC WITH DIFFERENTIAL/PLATELET
Basophils Absolute: 0.1 10*3/uL (ref 0.0–0.1)
Basophils Relative: 1.5 % (ref 0.0–3.0)
Eosinophils Absolute: 0 10*3/uL (ref 0.0–0.7)
Eosinophils Relative: 0.8 % (ref 0.0–5.0)
HCT: 31.7 % — ABNORMAL LOW (ref 39.0–52.0)
Hemoglobin: 10.5 g/dL — ABNORMAL LOW (ref 13.0–17.0)
Lymphocytes Relative: 34.8 % (ref 12.0–46.0)
Lymphs Abs: 2.1 10*3/uL (ref 0.7–4.0)
MCHC: 33.2 g/dL (ref 30.0–36.0)
MCV: 92.5 fl (ref 78.0–100.0)
Monocytes Absolute: 0.3 10*3/uL (ref 0.1–1.0)
Monocytes Relative: 5.6 % (ref 3.0–12.0)
Neutro Abs: 3.5 10*3/uL (ref 1.4–7.7)
Neutrophils Relative %: 57.3 % (ref 43.0–77.0)
Platelets: 175 10*3/uL (ref 150.0–400.0)
RBC: 3.43 Mil/uL — ABNORMAL LOW (ref 4.22–5.81)
RDW: 18.9 % — ABNORMAL HIGH (ref 11.5–15.5)
WBC: 6 10*3/uL (ref 4.0–10.5)

## 2023-08-14 LAB — PROTIME-INR
INR: 1.3 ratio — ABNORMAL HIGH (ref 0.8–1.0)
Prothrombin Time: 13.2 s — ABNORMAL HIGH (ref 9.6–13.1)

## 2023-08-14 LAB — BASIC METABOLIC PANEL WITH GFR
BUN: 8 mg/dL (ref 6–23)
CO2: 30 meq/L (ref 19–32)
Calcium: 8.7 mg/dL (ref 8.4–10.5)
Chloride: 100 meq/L (ref 96–112)
Creatinine, Ser: 0.76 mg/dL (ref 0.40–1.50)
GFR: 98.7 mL/min (ref >60.00)
Glucose, Bld: 120 mg/dL — ABNORMAL HIGH (ref 70–99)
Potassium: 3.5 meq/L (ref 3.5–5.1)
Sodium: 138 meq/L (ref 135–145)

## 2023-08-14 LAB — HEPATIC FUNCTION PANEL
ALT: 44 U/L (ref 0–53)
AST: 139 U/L — ABNORMAL HIGH (ref 0–37)
Albumin: 2.9 g/dL — ABNORMAL LOW (ref 3.5–5.2)
Alkaline Phosphatase: 280 U/L — ABNORMAL HIGH (ref 39–117)
Bilirubin, Direct: 0.5 mg/dL — ABNORMAL HIGH (ref 0.0–0.3)
Total Bilirubin: 1.1 mg/dL (ref 0.2–1.2)
Total Protein: 7.9 g/dL (ref 6.0–8.3)

## 2023-08-14 NOTE — Patient Instructions (Signed)
 Your provider has requested that you go to the basement level for lab work before leaving today. Press "B" on the elevator. The lab is located at the first door on the left as you exit the elevator.  You have been scheduled for an abdominal paracentesis at [facility] radiology (1st floor of hospital) on Friday 08/16/23 at 9 am. Please arrive at least 30 minutes prior to your appointment time for registration. Should you need to reschedule this appointment for any reason, please call our office at [phone].  You have been scheduled for an echocardiogram at [facility] (1st floor of hospital) on Thursday 09/12/23 at 12:30 pm. Please arrive at least 30 minutes prior to your appointment time for registration. Should you need to reschedule this appointment for any reason, please call [phone].  _______________________________________________________  If your blood pressure at your visit was 140/90 or greater, please contact your primary care physician to follow up on this.  _______________________________________________________  If you are age 53 or older, your body mass index should be between 23-30. Your Body mass index is 16.14 kg/m. If this is out of the aforementioned range listed, please consider follow up with your Primary Care Provider.  If you are age 43 or younger, your body mass index should be between 19-25. Your Body mass index is 16.14 kg/m. If this is out of the aformentioned range listed, please consider follow up with your Primary Care Provider.   ________________________________________________________  The [facility] providers would like to encourage you to use MYCHART to communicate with providers for non-urgent requests or questions.  Due to long hold times on the telephone, sending your provider a message by [facility] may be a faster and more efficient way to get a response.  Please allow 48 business hours for a response.  Please remember that this is for  non-urgent requests.  _______________________________________________________

## 2023-08-14 NOTE — Progress Notes (Signed)
 08/14/2023 Robert Carney [id number] 1965/06/13   HISTORY OF PRESENT ILLNESS: Is a 58 year old male who is a patient of Dr. Revonda Castles.  He was last seen by us  during a hospital stay in October 2024.  No follow-up since then.  He is here today for evaluation of his abdominal bloating/distention.  Wants to know what we can do about it.  Says that it seems a little bit better today than it had been.  Some upper abdominal pain, but not severe.  Last labs are from March.  CT scan of the abdomen and pelvis with contrast 03/2023:  IMPRESSION: Large volume ascites of unclear etiology.   Diffuse low-density throughout the liver compatible with fatty infiltration.   Cholelithiasis.   Areas of apparent wall thickening in the right colon and transverse colon, but colon is decompressed and difficult to evaluate. No evidence of bowel obstruction.   Trace bilateral pleural effusions, bibasilar atelectasis.   Circumferential wall thickening in the visualized distal esophagus suggesting esophagitis.   Colonoscopy 06/2022 revealing for internal hemorrhoids and a few left-sided diverticuli.   EGD 12/2022 was normal.   Past Medical History:  Diagnosis Date   Cervical myelopathy (HCC) 11/01/2016   Gait abnormality 11/01/2016   Hypertension    no longer taking meds   Past Surgical History:  Procedure Laterality Date   ANTERIOR CERVICAL CORPECTOMY N/A 03/26/2016   Procedure: Cervical  Seven Anterior cervical corpectomy;  Surgeon: Raelene Bullocks Ditty, MD;  Location: [facility] OR;  Service: Neurosurgery;  Laterality: N/A;  Cervical  Seven Anterior cervical corpectomy   ESOPHAGOGASTRODUODENOSCOPY (EGD) WITH PROPOFOL  N/A 12/26/2022   Procedure: ESOPHAGOGASTRODUODENOSCOPY (EGD) WITH PROPOFOL ;  Surgeon: Albertina Hugger, MD;  Location: [facility] ENDOSCOPY;  Service: Gastroenterology;  Laterality: N/A;   tooth pulled      reports that he has been smoking cigarettes. He started smoking about 34 years ago. He has  a 6.9 pack-year smoking history. He has been exposed to tobacco smoke. He has never used smokeless tobacco. He reports current alcohol use of about 42.0 standard drinks of alcohol per week. He reports current drug use. Drug: Marijuana. family history includes Breast cancer in his maternal aunt and sister; Diabetes in his mother; Hypertension in his father and mother; Lung cancer in his mother; Prostate cancer in his father. Allergies  Allergen Reactions   No Known Allergies       Outpatient Encounter Medications as of 08/14/2023  Medication Sig   Cyanocobalamin  (B-12 PO) Take by mouth.   folic acid  (FOLVITE ) 1 MG tablet Take 1 tablet (1 mg total) by mouth daily.   loperamide  (IMODIUM ) 2 MG capsule Take 1 capsule (2 mg total) by mouth 4 (four) times daily as needed for diarrhea or loose stools.   metoprolol  tartrate (LOPRESSOR ) 25 MG tablet Take 0.5 tablets (12.5 mg total) by mouth 2 (two) times daily.   pantoprazole  (PROTONIX ) 40 MG tablet Take 1 tablet (40 mg total) by mouth daily.   potassium chloride  (KLOR-CON ) 10 MEQ tablet Take 2 tablets (20 mEq total) by mouth daily.   thiamine  (VITAMIN B-1) 100 MG tablet Take 1 tablet (100 mg total) by mouth daily.   No facility-administered encounter medications on file as of 08/14/2023.    REVIEW OF SYSTEMS  : All other systems reviewed and negative except where noted in the History of Present Illness.   PHYSICAL EXAM: BP 124/78   Pulse 95   Ht 6' (1.829 m)   BMI 16.14 kg/m  General:  Well developed AA male in no acute distress Head: Normocephalic and atraumatic Eyes:  Sclerae anicteric, conjunctiva pink. Ears: Normal auditory acuity Lungs: Clear throughout to auscultation; no W/R/R. Heart: Regular rate and rhythm; no M/R/G. Abdomen: Soft, but distended with ascites fluid.  BS present.  Reducible umbilical hernia noted.  Non-tender.   Musculoskeletal: Symmetrical with no gross deformities  Skin: No lesions on visible  extremities Extremities: No edema  Neurological: Alert oriented x 4, grossly non-focal Psychological:  Alert and cooperative. Normal mood and affect  ASSESSMENT AND PLAN: *Ascites: Seen on CT scan in January 2025.  Specifically on that scan it said unspecified source of ascites.  He does have diffuse fatty liver, but most recent liver enzymes were normal, platelets normal, INR normal all pointing against cirrhosis.  Has normal renal function.  Will plan for paracentesis to give him some relief as he is requesting.  Maximum of 5 L removed.  Will check fluid studies for cell count, culture, cytology, albumin.  Will check labs today including CBC, BMP, hepatic function panel, PT/INR.  Will try to calculate a SAAG.  Will order 2D echo as well to rule out heart failure.  Discussed 2 g sodium diet.  May need to try some low-dose diuretics post paracentesis to try to keep fluid levels down. *History of alcohol use: When last seen by us  in October he had discontinued use.  Now using some alcohol again.  Smell like alcohol at his visit today.  Will check a Peth level.  Needs to discontinue use.   CC:  [name], [name], [name]

## 2023-08-15 ENCOUNTER — Ambulatory Visit: Payer: Self-pay | Admitting: Gastroenterology

## 2023-08-16 ENCOUNTER — Other Ambulatory Visit: Payer: Self-pay | Admitting: Gastroenterology

## 2023-08-16 ENCOUNTER — Ambulatory Visit (HOSPITAL_COMMUNITY)
Admission: RE | Admit: 2023-08-16 | Discharge: 2023-08-16 | Disposition: A | Discharge status: Home / Self Care | Source: Ambulatory Visit | Attending: Gastroenterology | Admitting: Gastroenterology

## 2023-08-16 DIAGNOSIS — F1010 Alcohol abuse, uncomplicated: Secondary | ICD-10-CM

## 2023-08-16 DIAGNOSIS — R188 Other ascites: Secondary | ICD-10-CM

## 2023-08-16 DIAGNOSIS — R140 Abdominal distension (gaseous): Secondary | ICD-10-CM | POA: Diagnosis not present

## 2023-08-16 MED ORDER — LIDOCAINE HCL 1 % IJ SOLN
INTRAMUSCULAR | Status: AC
  Filled 2023-08-16: qty 20

## 2023-08-18 LAB — PHOSPHATIDYLETHANOL (PETH)
Phosphatidylethanol (PEth): >2000 ng/mL
Phosphatidylethanol: POSITIVE — AB

## 2023-08-21 NOTE — Progress Notes (Signed)
 ____________________________________________________________  Attending physician addendum:  Thank you for sending this case to me. I have reviewed the entire note and agree with the plan.  Curiously, the ultrasound showed very little ascites, and no pocket amenable to paracentesis.  The ascites is therefore presumably portal hypertensive in nature and the volume fluctuating perhaps based on patient's alcohol intake. Thank you for getting the PETH level, which returned greater than 2000.  This indicates the patient has been drinking alcohol excessively on a regular basis.  Lorella Roles, MD  ____________________________________________________________

## 2023-08-27 ENCOUNTER — Telehealth: Payer: Self-pay | Admitting: Family Medicine

## 2023-08-27 ENCOUNTER — Other Ambulatory Visit: Payer: Self-pay

## 2023-08-27 NOTE — Telephone Encounter (Signed)
 Copied from CRM [id number]. Topic: Clinical - Medication Question >> Aug 27, 2023  1:29 PM Marlan Silva wrote: Reason for CRM: Patient has a questions about what new medication his doctor ha put him on, and would like some information on dosage. Patient states he normally checks that info in his mychart but he can not access it. Patient requesting call back to [phone].  ---  Pcp out of office, forwarding to DOD.

## 2023-08-28 NOTE — Telephone Encounter (Signed)
 This will get sent back to provider

## 2023-08-29 NOTE — Telephone Encounter (Signed)
 ATC pt but VM was full. PCP has not started him on any new medications.

## 2023-09-12 ENCOUNTER — Ambulatory Visit (HOSPITAL_BASED_OUTPATIENT_CLINIC_OR_DEPARTMENT_OTHER)

## 2023-09-12 DIAGNOSIS — R188 Other ascites: Secondary | ICD-10-CM

## 2023-09-12 LAB — ECHOCARDIOGRAM COMPLETE
Area-P 1/2: 3.21 cm2
S' Lateral: 3.13 cm

## 2023-09-13 ENCOUNTER — Encounter: Payer: Self-pay | Admitting: Family Medicine

## 2023-09-13 ENCOUNTER — Ambulatory Visit (INDEPENDENT_AMBULATORY_CARE_PROVIDER_SITE_OTHER): Admitting: Family Medicine

## 2023-09-13 VITALS — BP 116/78 | HR 56 | Temp 97.6°F | Ht 72.0 in | Wt 117.0 lb

## 2023-09-13 DIAGNOSIS — I10 Essential (primary) hypertension: Secondary | ICD-10-CM | POA: Diagnosis not present

## 2023-09-13 DIAGNOSIS — R188 Other ascites: Secondary | ICD-10-CM

## 2023-09-13 DIAGNOSIS — R7989 Other specified abnormal findings of blood chemistry: Secondary | ICD-10-CM

## 2023-09-13 DIAGNOSIS — R109 Unspecified abdominal pain: Secondary | ICD-10-CM

## 2023-09-13 DIAGNOSIS — G8929 Other chronic pain: Secondary | ICD-10-CM

## 2023-09-13 NOTE — Progress Notes (Signed)
 Subjective:     Patient ID: Robert Carney, male    DOB: 1966-01-30, 58 y.o.   MRN: [id number]  Chief Complaint  Patient presents with   Results    Stomach still protruding. Went to GI and they said no fluid in there, also went to heart and vascular yesterday but havent gotten results from them yet. Just want to know next steps of what to do.    HPI   History of Present Illness         He is here with his brother with complaints of abdominal pain.  He has been going through extensive testing for ascites and liver disease.  States he has not been drinking much alcohol recently. Under the care of [facility].  States he does not have a follow-up visit scheduled.  Echocardiogram done yesterday.  Results pending.  Reports being able to eat and drink but he does have decreased appetite.  Denies fever, chills, syncope, dizziness, headaches, chest pain, palpitations, shortness of breath, vomiting or diarrhea.  Denies bleeding.  Health Maintenance Due  Topic Date Due   COVID-19 Vaccine (1) Never done   Pneumococcal Vaccine 57-63 Years old (1 of 2 - PCV) Never done   Hepatitis B Vaccines (1 of 3 - 19+ 3-dose series) Never done   Zoster Vaccines- Shingrix  (1 of 2) Never done    Past Medical History:  Diagnosis Date   Cervical myelopathy (HCC) 11/01/2016   Gait abnormality 11/01/2016   Hypertension    no longer taking meds    Past Surgical History:  Procedure Laterality Date   ANTERIOR CERVICAL CORPECTOMY N/A 03/26/2016   Procedure: Cervical  Seven Anterior cervical corpectomy;  Surgeon: Morene Hicks Ditty, MD;  Location: [facility] OR;  Service: Neurosurgery;  Laterality: N/A;  Cervical  Seven Anterior cervical corpectomy   ESOPHAGOGASTRODUODENOSCOPY (EGD) WITH PROPOFOL  N/A 12/26/2022   Procedure: ESOPHAGOGASTRODUODENOSCOPY (EGD) WITH PROPOFOL ;  Surgeon: Legrand Victory LITTIE DOUGLAS, MD;  Location: [facility] ENDOSCOPY;  Service: Gastroenterology;  Laterality: N/A;   tooth pulled      Family History   Problem Relation Age of Onset   Diabetes Mother    Lung cancer Mother    Hypertension Mother    Prostate cancer Father    Hypertension Father    Breast cancer Sister    Breast cancer Maternal Aunt    Colon cancer Neg Hx    Colon polyps Neg Hx    Esophageal cancer Neg Hx    Rectal cancer Neg Hx    Stomach cancer Neg Hx     Social History   Socioeconomic History   Marital status: Single    Spouse name: Not on file   Number of children: 4   Years of education: 12   Highest education level: Not on file  Occupational History   Occupation: [employer] Furn[employer]   Tobacco Use   Smoking status: Some Days    Current packs/day: 0.20    Average packs/day: 0.2 packs/day for 34.5 years (6.9 ttl pk-yrs)    Types: Cigarettes    Start date: 03/19/1989    Passive exposure: Current   Smokeless tobacco: Never  Vaping Use   Vaping status: Never Used  Substance and Sexual Activity   Alcohol use: Yes    Alcohol/week: 42.0 standard drinks of alcohol    Types: 42 Cans of beer per week    Comment: Daily beer drinker   Drug use: Yes    Types: Marijuana    Comment: No  history of IV drug use.   Sexual activity: Yes  Other Topics Concern   Not on file  Social History Narrative   Lives with mother, Dickey   Caffeine use: sometimes   Right handed   Social Drivers of Health   Financial Resource Strain: Low Risk  (08/08/2023)   Overall Financial Resource Strain (CARDIA)    Difficulty of Paying Living Expenses: Not hard at all  Food Insecurity: No Food Insecurity (08/08/2023)   Hunger Vital Sign    Worried About Running Out of Food in the Last Year: Never true    Ran Out of Food in the Last Year: Never true  Transportation Needs: No Transportation Needs (08/08/2023)   PRAPARE - Administrator, Civil Service (Medical): No    Lack of Transportation (Non-Medical): No  Physical Activity: Insufficiently Active (08/08/2023)   Exercise Vital Sign    Days of Exercise per Week: 3  days    Minutes of Exercise per Session: 40 min  Stress: No Stress Concern Present (08/08/2023)   [employer] of Occupational Health - Occupational Stress Questionnaire    Feeling of Stress : Not at all  Social Connections: Moderately Integrated (08/08/2023)   Social Connection and Isolation Panel    Frequency of Communication with Friends and Family: More than three times a week    Frequency of Social Gatherings with Friends and Family: More than three times a week    Attends Religious Services: More than 4 times per year    Active Member of [employer] or Organizations: Yes    Attends Banker Meetings: More than 4 times per year    Marital Status: Never married  Intimate Partner Violence: Not At Risk (08/08/2023)   Humiliation, Afraid, Rape, and Kick questionnaire    Fear of Current or Ex-Partner: No    Emotionally Abused: No    Physically Abused: No    Sexually Abused: No    Outpatient Medications Prior to Visit  Medication Sig Dispense Refill   Cyanocobalamin  (B-12 PO) Take by mouth.     folic acid  (FOLVITE ) 1 MG tablet Take 1 tablet (1 mg total) by mouth daily. 90 tablet 0   loperamide  (IMODIUM ) 2 MG capsule Take 1 capsule (2 mg total) by mouth 4 (four) times daily as needed for diarrhea or loose stools. 12 capsule 0   metoprolol  tartrate (LOPRESSOR ) 25 MG tablet Take 0.5 tablets (12.5 mg total) by mouth 2 (two) times daily. 90 tablet 0   pantoprazole  (PROTONIX ) 40 MG tablet Take 1 tablet (40 mg total) by mouth daily. 90 tablet 0   potassium chloride  (KLOR-CON ) 10 MEQ tablet Take 2 tablets (20 mEq total) by mouth daily. 2 tablet 0   thiamine  (VITAMIN B-1) 100 MG tablet Take 1 tablet (100 mg total) by mouth daily. 90 tablet 0   No facility-administered medications prior to visit.    Allergies  Allergen Reactions   No Known Allergies     Review of Systems  Constitutional:  Positive for malaise/fatigue. Negative for chills and fever.  Eyes:  Negative for blurred  vision and double vision.  Respiratory:  Negative for cough and shortness of breath.   Cardiovascular:  Negative for chest pain, palpitations, orthopnea and leg swelling.  Gastrointestinal:  Positive for abdominal pain and nausea. Negative for blood in stool, constipation, diarrhea and vomiting.  Genitourinary:  Negative for dysuria, frequency and urgency.  Musculoskeletal:  Negative for falls.  Neurological:  Negative for dizziness, tingling, focal weakness  and headaches.       Objective:    Physical Exam Constitutional:      General: He is not in acute distress.    Appearance: He is not ill-appearing.  HENT:     Mouth/Throat:     Mouth: Mucous membranes are moist.     Pharynx: Oropharynx is clear.   Eyes:     Extraocular Movements: Extraocular movements intact.     Conjunctiva/sclera: Conjunctivae normal.    Cardiovascular:     Rate and Rhythm: Normal rate and regular rhythm.  Pulmonary:     Effort: Pulmonary effort is normal.     Breath sounds: Normal breath sounds.  Abdominal:     General: Abdomen is protuberant. Bowel sounds are normal. There is distension.     Tenderness: There is generalized abdominal tenderness. There is no guarding or rebound. Negative signs include Murphy's sign and McBurney's sign.   Musculoskeletal:     Cervical back: Normal range of motion and neck supple.     Right lower leg: No edema.     Left lower leg: No edema.   Skin:    General: Skin is warm and dry.   Neurological:     General: No focal deficit present.     Mental Status: He is alert and oriented to person, place, and time.     Motor: No weakness.     Coordination: Coordination normal.   Psychiatric:        Mood and Affect: Mood normal.        Behavior: Behavior normal.        Thought Content: Thought content normal.      BP 116/78 (BP Location: Left Arm, Patient Position: Sitting)   Pulse (!) 56   Temp 97.6 F (36.4 C) (Temporal)   Ht 6' (1.829 m)   Wt 117 lb (53.1  kg)   SpO2 100%   BMI 15.87 kg/m  Wt Readings from Last 3 Encounters:  09/13/23 117 lb (53.1 kg)  08/14/23 122 lb (55.3 kg)  08/08/23 119 lb (54 kg)       Assessment & Plan:   Problem List Items Addressed This Visit     Abnormal LFTs   Chronic abdominal pain - Primary   Other ascites   Other Visit Diagnoses       Essential hypertension          Reviewed notes and results from specialists.  There is an echocardiogram pending. Does not appear to be in any acute distress. Encouraged him to avoid alcohol and NSAIDs. May continue pantoprazole  unless advised to discontinue it. Encouraged him to follow-up with GI for abdominal pain and abnormal liver findings.   I am having Robert Carney maintain his potassium chloride , Cyanocobalamin  (B-12 PO), pantoprazole , folic acid , metoprolol  tartrate, thiamine , and loperamide .  No orders of the defined types were placed in this encounter.

## 2023-09-13 NOTE — Patient Instructions (Addendum)
 Please call [facility] to schedule a visit. [phone]  Avoid alcohol and over the counter pain medications such as ibuprofen, Aleve , Advil, aspirin, BC Goody, or others.   You can continue the acid medication pantoprazole .   If you notice any bleeding, please go to the emergency department.

## 2023-09-30 ENCOUNTER — Other Ambulatory Visit: Payer: Self-pay | Admitting: *Deleted

## 2023-09-30 DIAGNOSIS — F1010 Alcohol abuse, uncomplicated: Secondary | ICD-10-CM

## 2023-09-30 DIAGNOSIS — R188 Other ascites: Secondary | ICD-10-CM

## 2023-10-04 ENCOUNTER — Other Ambulatory Visit: Payer: Self-pay | Admitting: Family Medicine

## 2023-10-04 ENCOUNTER — Other Ambulatory Visit

## 2023-10-04 DIAGNOSIS — R188 Other ascites: Secondary | ICD-10-CM

## 2023-10-04 DIAGNOSIS — F1010 Alcohol abuse, uncomplicated: Secondary | ICD-10-CM | POA: Diagnosis not present

## 2023-10-04 LAB — COMPREHENSIVE METABOLIC PANEL WITH GFR
ALT: 28 U/L (ref 0–53)
AST: 101 U/L — ABNORMAL HIGH (ref 0–37)
Albumin: 3 g/dL — ABNORMAL LOW (ref 3.5–5.2)
Alkaline Phosphatase: 245 U/L — ABNORMAL HIGH (ref 39–117)
BUN: 4 mg/dL — ABNORMAL LOW (ref 6–23)
CO2: 27 meq/L (ref 19–32)
Calcium: 8.5 mg/dL (ref 8.4–10.5)
Chloride: 97 meq/L (ref 96–112)
Creatinine, Ser: 0.65 mg/dL (ref 0.40–1.50)
GFR: 103.37 mL/min (ref >60.00)
Glucose, Bld: 94 mg/dL (ref 70–99)
Potassium: 3.4 meq/L — ABNORMAL LOW (ref 3.5–5.1)
Sodium: 133 meq/L — ABNORMAL LOW (ref 135–145)
Total Bilirubin: 0.7 mg/dL (ref 0.2–1.2)
Total Protein: 8 g/dL (ref 6.0–8.3)

## 2023-10-04 LAB — CBC WITH DIFFERENTIAL/PLATELET
Basophils Absolute: 0.1 K/uL (ref 0.0–0.1)
Basophils Relative: 1.5 % (ref 0.0–3.0)
Eosinophils Absolute: 0.2 K/uL (ref 0.0–0.7)
Eosinophils Relative: 4.8 % (ref 0.0–5.0)
HCT: 35.4 % — ABNORMAL LOW (ref 39.0–52.0)
Hemoglobin: 11.7 g/dL — ABNORMAL LOW (ref 13.0–17.0)
Lymphocytes Relative: 40 % (ref 12.0–46.0)
Lymphs Abs: 2 K/uL (ref 0.7–4.0)
MCHC: 32.9 g/dL (ref 30.0–36.0)
MCV: 96.6 fl (ref 78.0–100.0)
Monocytes Absolute: 0.3 K/uL (ref 0.1–1.0)
Monocytes Relative: 6.9 % (ref 3.0–12.0)
Neutro Abs: 2.4 K/uL (ref 1.4–7.7)
Neutrophils Relative %: 46.8 % (ref 43.0–77.0)
Platelets: 161 K/uL (ref 150.0–400.0)
RBC: 3.67 Mil/uL — ABNORMAL LOW (ref 4.22–5.81)
RDW: 13.1 % (ref 11.5–15.5)
WBC: 5.1 K/uL (ref 4.0–10.5)

## 2023-10-04 LAB — PROTIME-INR
INR: 1.3 ratio — ABNORMAL HIGH (ref 0.8–1.0)
Prothrombin Time: 13.7 s — ABNORMAL HIGH (ref 9.6–13.1)

## 2023-10-10 ENCOUNTER — Ambulatory Visit: Payer: Self-pay | Admitting: Gastroenterology

## 2023-10-27 NOTE — Progress Notes (Signed)
 Robert Carney [id number] 1965-11-06   Chief Complaint: Abdominal pain  Referring Provider: Lendia Boby LITTIE, [name] [facility] MD: Dr. Legrand  HPI: Robert Carney is a 58 y.o. male with past medical history of HTN, cervical myelopathy, alcohol use, smoking who presents today for a complaint of abdominal pain.    Patient admitted to the hospital 12/25/2022 to 12/27/2022 for acute GI bleed after presenting to the ED with complaint of dizziness and noted to have FOBT positive stool.  Hemoglobin dropped to 9.7.  He underwent EGD which was unremarkable.  Placed on PPI.  Patient last seen in office 08/14/2023 by Robert Mail, PA-C for complaint of abdominal bloating and distention.   CT A/P 03/2023 showed large volume ascites of unclear etiology, fatty liver, cholelithiasis, areas of apparent wall thickening in the right colon and transverse colon, though colon was decompressed and difficult to evaluate, circumferential wall thickening in the visualized distal esophagus suggesting esophagitis.  EGD 12/2022 was normal.  Colonoscopy 06/2022 revealed a few left-sided diverticuli and internal hemorrhoids, otherwise normal.  History of alcohol use, noted to smell like alcohol at last visit.    Labs 08/14/2023: INR 1.3, PT 13.2, alk phos 280, AST 139, ALT 44, albumin 2.9, total bilirubin 1.1, hemoglobin 10.5, platelets 175, Peth positive  Abdominal ultrasound with paracentesis was ordered and showed small volume ascites without pocket of fluid large enough to allow for safe approach for paracentesis on 08/16/2023.  Labs 10/04/2023: INR 1.3, PT 13.7, hemoglobin 11.7, platelets 161, alk phos 245, AST 101, ALT 28, albumin 3  Has been advised to abstain from alcohol.  Previous GI Procedures/Imaging   CT A/P 04/09/2023 - Large volume ascites of unclear etiology. - Diffuse low-density throughout the liver compatible with fatty infiltration. - Cholelithiasis. - Areas of apparent wall thickening in the  right colon and transverse colon, but colon is decompressed and difficult to evaluate. No evidence of bowel obstruction. - Trace bilateral pleural effusions, bibasilar atelectasis. - Circumferential wall thickening in the visualized distal esophagus suggesting esophagitis.   Past Medical History:  Diagnosis Date   Cervical myelopathy (HCC) 11/01/2016   Gait abnormality 11/01/2016   Hypertension    no longer taking meds    Past Surgical History:  Procedure Laterality Date   ANTERIOR CERVICAL CORPECTOMY N/A 03/26/2016   Procedure: Cervical  Seven Anterior cervical corpectomy;  Surgeon: Robert Hicks Ditty, MD;  Location: [facility] OR;  Service: Neurosurgery;  Laterality: N/A;  Cervical  Seven Anterior cervical corpectomy   ESOPHAGOGASTRODUODENOSCOPY (EGD) WITH PROPOFOL  N/A 12/26/2022   Procedure: ESOPHAGOGASTRODUODENOSCOPY (EGD) WITH PROPOFOL ;  Surgeon: Robert Victory Carney DOUGLAS, MD;  Location: [facility] ENDOSCOPY;  Service: Gastroenterology;  Laterality: N/A;   tooth pulled      Current Outpatient Medications  Medication Sig Dispense Refill   Cyanocobalamin  (B-12 PO) Take by mouth.     folic acid  (FOLVITE ) 1 MG tablet Take 1 tablet (1 mg total) by mouth daily. 90 tablet 0   loperamide  (IMODIUM ) 2 MG capsule Take 1 capsule (2 mg total) by mouth 4 (four) times daily as needed for diarrhea or loose stools. 12 capsule 0   metoprolol  tartrate (LOPRESSOR ) 25 MG tablet Take 0.5 tablets (12.5 mg total) by mouth 2 (two) times daily. 90 tablet 0   pantoprazole  (PROTONIX ) 40 MG tablet Take 1 tablet (40 mg total) by mouth daily. 90 tablet 0   potassium chloride  (KLOR-CON ) 10 MEQ tablet Take 2 tablets (20 mEq total) by mouth daily. 2 tablet 0  thiamine  (VITAMIN B-1) 100 MG tablet Take 1 tablet (100 mg total) by mouth daily. 90 tablet 0   No current facility-administered medications for this visit.    Allergies as of 10/28/2023 - Review Complete 09/13/2023  Allergen Reaction Noted   No known allergies  03/25/2016     Family History  Problem Relation Age of Onset   Diabetes Mother    Lung cancer Mother    Hypertension Mother    Prostate cancer Father    Hypertension Father    Breast cancer Sister    Breast cancer Maternal Aunt    Colon cancer Neg Hx    Colon polyps Neg Hx    Esophageal cancer Neg Hx    Rectal cancer Neg Hx    Stomach cancer Neg Hx     Social History   Tobacco Use   Smoking status: Some Days    Current packs/day: 0.20    Average packs/day: 0.2 packs/day for 34.6 years (6.9 ttl pk-yrs)    Types: Cigarettes    Start date: 03/19/1989    Passive exposure: Current   Smokeless tobacco: Never  Vaping Use   Vaping status: Never Used  Substance Use Topics   Alcohol use: Yes    Alcohol/week: 42.0 standard drinks of alcohol    Types: 42 Cans of beer per week    Comment: Daily beer drinker   Drug use: Yes    Types: Marijuana    Comment: No history of IV drug use.     Review of Systems:    Constitutional: No weight loss, fever, chills, weakness or fatigue Eyes: No change in vision Ears, Nose, Throat:  No change in hearing or congestion Skin: No rash or itching Cardiovascular: No chest pain, chest pressure or palpitations   Respiratory: No SOB or cough Gastrointestinal: See HPI and otherwise negative Genitourinary: No dysuria or change in urinary frequency Neurological: No headache, dizziness or syncope Musculoskeletal: No new muscle or joint pain Hematologic: No bleeding or bruising    Physical Exam:  Vital signs: There were no vitals taken for this visit.  Constitutional: NAD, Well developed, Well nourished, alert and cooperative Head:  Normocephalic and atraumatic.  Eyes: No scleral icterus. Conjunctiva pink. Mouth: No oral lesions. Respiratory: Respirations even and unlabored. Lungs clear to auscultation bilaterally.  No wheezes, crackles, or rhonchi.  Cardiovascular:  Regular rate and rhythm. No murmurs. No peripheral edema. Gastrointestinal:  Soft,  nondistended, nontender. No rebound or guarding. Normal bowel sounds. No appreciable masses or hepatomegaly. Rectal:  Not performed.  Neurologic:  Alert and oriented x4;  grossly normal neurologically.  Skin:   Dry and intact without significant lesions or rashes. Psychiatric: Oriented to person, place and time. Demonstrates good judgement and reason without abnormal affect or behaviors.   RELEVANT LABS AND IMAGING: CBC    Component Value Date/Time   WBC 5.1 10/04/2023 1221   RBC 3.67 (L) 10/04/2023 1221   HGB 11.7 (L) 10/04/2023 1221   HGB 12.5 (L) 04/18/2022 0000   HGB 13.1 10/02/2016 1435   HCT 35.4 (L) 10/04/2023 1221   HCT 37.4 (L) 04/18/2022 0000   HCT 39.5 10/02/2016 1435   PLT 161.0 10/04/2023 1221   PLT 303 04/18/2022 0000   MCV 96.6 10/04/2023 1221   MCV 90 04/18/2022 0000   MCV 88.0 10/02/2016 1435   MCH 32.0 04/09/2023 1118   MCHC 32.9 10/04/2023 1221   RDW 13.1 10/04/2023 1221   RDW 13.7 04/18/2022 0000   RDW 13.8 10/02/2016 1435  LYMPHSABS 2.0 10/04/2023 1221   LYMPHSABS 2.8 04/18/2022 0000   LYMPHSABS 1.8 10/02/2016 1435   MONOABS 0.3 10/04/2023 1221   MONOABS 0.4 10/02/2016 1435   EOSABS 0.2 10/04/2023 1221   EOSABS 0.2 04/18/2022 0000   BASOSABS 0.1 10/04/2023 1221   BASOSABS 0.1 04/18/2022 0000   BASOSABS 0.0 10/02/2016 1435    CMP     Component Value Date/Time   NA 133 (L) 10/04/2023 1221   NA 138 04/18/2022 0000   NA 140 10/02/2016 1435   K 3.4 (L) 10/04/2023 1221   K 3.7 10/02/2016 1435   CL 97 10/04/2023 1221   CO2 27 10/04/2023 1221   CO2 28 10/02/2016 1435   GLUCOSE 94 10/04/2023 1221   GLUCOSE 85 10/02/2016 1435   BUN 4 (L) 10/04/2023 1221   BUN 6 04/18/2022 0000   BUN 12.2 10/02/2016 1435   CREATININE 0.65 10/04/2023 1221   CREATININE 1.2 10/02/2016 1435   CALCIUM 8.5 10/04/2023 1221   CALCIUM 10.2 10/02/2016 1435   PROT 8.0 10/04/2023 1221   PROT 8.0 04/18/2022 0000   PROT 9.0 (H) 10/02/2016 1435   ALBUMIN 3.0 (L) 10/04/2023  1221   ALBUMIN 4.3 04/18/2022 0000   ALBUMIN 4.6 10/02/2016 1435   AST 101 (H) 10/04/2023 1221   AST 23 10/02/2016 1435   ALT 28 10/04/2023 1221   ALT 13 10/02/2016 1435   ALKPHOS 245 (H) 10/04/2023 1221   ALKPHOS 52 10/02/2016 1435   BILITOT 0.7 10/04/2023 1221   BILITOT <0.2 04/18/2022 0000   BILITOT 0.40 10/02/2016 1435   GFRNONAA >60 04/09/2023 1118   GFRAA 93 11/24/2019 0836     Assessment/Plan:       Camie Furbish, PA-C [facility] 10/27/2023, 8:00 PM  Patient Care Team: Robert Boby CROME, [name] as PCP - General (Family Medicine)

## 2023-10-28 ENCOUNTER — Other Ambulatory Visit (INDEPENDENT_AMBULATORY_CARE_PROVIDER_SITE_OTHER)

## 2023-10-28 ENCOUNTER — Ambulatory Visit (INDEPENDENT_AMBULATORY_CARE_PROVIDER_SITE_OTHER): Admitting: Gastroenterology

## 2023-10-28 ENCOUNTER — Encounter: Payer: Self-pay | Admitting: Gastroenterology

## 2023-10-28 VITALS — BP 104/60 | HR 72 | Resp 94 | Ht 72.0 in | Wt 118.0 lb

## 2023-10-28 DIAGNOSIS — R7989 Other specified abnormal findings of blood chemistry: Secondary | ICD-10-CM

## 2023-10-28 DIAGNOSIS — K760 Fatty (change of) liver, not elsewhere classified: Secondary | ICD-10-CM

## 2023-10-28 DIAGNOSIS — F1010 Alcohol abuse, uncomplicated: Secondary | ICD-10-CM

## 2023-10-28 DIAGNOSIS — Z8719 Personal history of other diseases of the digestive system: Secondary | ICD-10-CM | POA: Diagnosis not present

## 2023-10-28 DIAGNOSIS — R188 Other ascites: Secondary | ICD-10-CM

## 2023-10-28 LAB — COMPREHENSIVE METABOLIC PANEL WITH GFR
ALT: 20 U/L (ref 0–53)
AST: 79 U/L — ABNORMAL HIGH (ref 0–37)
Albumin: 3 g/dL — ABNORMAL LOW (ref 3.5–5.2)
Alkaline Phosphatase: 203 U/L — ABNORMAL HIGH (ref 39–117)
BUN: 6 mg/dL (ref 6–23)
CO2: 29 meq/L (ref 19–32)
Calcium: 8.7 mg/dL (ref 8.4–10.5)
Chloride: 98 meq/L (ref 96–112)
Creatinine, Ser: 0.63 mg/dL (ref 0.40–1.50)
GFR: 104.31 mL/min (ref >60.00)
Glucose, Bld: 73 mg/dL (ref 70–99)
Potassium: 3.9 meq/L (ref 3.5–5.1)
Sodium: 133 meq/L — ABNORMAL LOW (ref 135–145)
Total Bilirubin: 0.5 mg/dL (ref 0.2–1.2)
Total Protein: 7.9 g/dL (ref 6.0–8.3)

## 2023-10-28 LAB — CBC WITH DIFFERENTIAL/PLATELET
Basophils Absolute: 0.1 K/uL (ref 0.0–0.1)
Basophils Relative: 1 % (ref 0.0–3.0)
Eosinophils Absolute: 0.3 K/uL (ref 0.0–0.7)
Eosinophils Relative: 4.5 % (ref 0.0–5.0)
HCT: 37.8 % — ABNORMAL LOW (ref 39.0–52.0)
Hemoglobin: 12.4 g/dL — ABNORMAL LOW (ref 13.0–17.0)
Lymphocytes Relative: 27.1 % (ref 12.0–46.0)
Lymphs Abs: 1.6 K/uL (ref 0.7–4.0)
MCHC: 32.7 g/dL (ref 30.0–36.0)
MCV: 94.1 fl (ref 78.0–100.0)
Monocytes Absolute: 0.5 K/uL (ref 0.1–1.0)
Monocytes Relative: 8.1 % (ref 3.0–12.0)
Neutro Abs: 3.5 K/uL (ref 1.4–7.7)
Neutrophils Relative %: 59.3 % (ref 43.0–77.0)
Platelets: 166 K/uL (ref 150.0–400.0)
RBC: 4.02 Mil/uL — ABNORMAL LOW (ref 4.22–5.81)
RDW: 12.9 % (ref 11.5–15.5)
WBC: 6 K/uL (ref 4.0–10.5)

## 2023-10-28 LAB — PROTIME-INR
INR: 1.2 ratio — ABNORMAL HIGH (ref 0.8–1.0)
Prothrombin Time: 12.7 s (ref 9.6–13.1)

## 2023-10-28 NOTE — Patient Instructions (Signed)
 You have been scheduled for an abdominal ultrasound at [facility]iology (1st floor of hospital) on __________ at ______________. Please arrive 30 minutes prior to your appointment for registration. Make certain not to have anything to eat or drink 6 hours prior to your appointment. Should you need to reschedule your appointment, please contact radiology at [phone]. This test typically takes about 30 minutes to perform.  You will be contacted by [facility] Sched[facility] in the next 2 days to arrange a Ultrasound Complete.  The number on your caller ID will be [phone], please answer when they call.  If you have not heard from them in 2 days please call [phone] to schedule.     Your provider has requested that you go to the basement level for lab work before leaving today. Press B on the elevator. The lab is located at the first door on the left as you exit the elevator.   Due to recent changes in healthcare laws, you may see the results of your imaging and laboratory studies on MyChart before your provider has had a chance to review them.  We understand that in some cases there may be results that are confusing or concerning to you. Not all laboratory results come back in the same time frame and the provider may be waiting for multiple results in order to interpret others.  Please give us  48 hours in order for your provider to thoroughly review all the results before contacting the office for clarification of your results.    I appreciate the  opportunity to care for you  Thank You   Camie Heinz,PA-C

## 2023-10-29 ENCOUNTER — Ambulatory Visit: Payer: Self-pay | Admitting: Gastroenterology

## 2023-10-29 ENCOUNTER — Encounter: Payer: Self-pay | Admitting: Gastroenterology

## 2023-10-30 ENCOUNTER — Ambulatory Visit (HOSPITAL_COMMUNITY)
Admission: RE | Admit: 2023-10-30 | Discharge: 2023-10-30 | Disposition: A | Discharge status: Home / Self Care | Source: Ambulatory Visit | Attending: Gastroenterology | Admitting: Gastroenterology

## 2023-10-30 DIAGNOSIS — K760 Fatty (change of) liver, not elsewhere classified: Secondary | ICD-10-CM | POA: Insufficient documentation

## 2023-10-30 DIAGNOSIS — R7989 Other specified abnormal findings of blood chemistry: Secondary | ICD-10-CM | POA: Insufficient documentation

## 2023-10-30 DIAGNOSIS — R188 Other ascites: Secondary | ICD-10-CM | POA: Insufficient documentation

## 2023-10-30 DIAGNOSIS — F1010 Alcohol abuse, uncomplicated: Secondary | ICD-10-CM | POA: Diagnosis present

## 2023-10-30 NOTE — Progress Notes (Signed)
 ____________________________________________________________  Attending physician addendum:  Thank you for sending this case to me. I have reviewed the entire note and agree with the plan.  If ultrasound at this time shows a significant amount of ascites, and I think a repeat attempted paracentesis is warranted to be certain of our suspicion that this ascites is portal hypertensive in nature. Given his most recent PETH level of greater than 2000, it is clear that alcohol cessation is a matter of highest priority for him.  Victory Brand, MD  ____________________________________________________________

## 2023-11-02 LAB — PHOSPHATIDYLETHANOL (PETH)
Phosphatidylethanol (PEth): 1237 ng/mL
Phosphatidylethanol: POSITIVE — AB

## 2023-11-11 ENCOUNTER — Other Ambulatory Visit: Payer: Self-pay

## 2023-11-11 ENCOUNTER — Other Ambulatory Visit: Payer: Self-pay | Admitting: Family Medicine

## 2023-11-11 DIAGNOSIS — R188 Other ascites: Secondary | ICD-10-CM

## 2023-11-11 DIAGNOSIS — I10 Essential (primary) hypertension: Secondary | ICD-10-CM

## 2023-11-11 DIAGNOSIS — Z760 Encounter for issue of repeat prescription: Secondary | ICD-10-CM

## 2023-11-11 NOTE — Telephone Encounter (Unsigned)
 Copied from CRM #[id number]. Topic: Clinical - Medication Refill >> Nov 11, 2023  3:08 PM Hamdi H wrote: Medication: folic acid  (FOLVITE ) 1 MG tablet loperamide  (IMODIUM ) 2 MG capsule metoprolol  tartrate (LOPRESSOR ) 25 MG tablet pantoprazole  (PROTONIX ) 40 MG tablet potassium chloride  (KLOR-CON ) 10 MEQ tablet thiamine  (VITAMIN B-1) 100 MG tablet Has the patient contacted their pharmacy? No (Agent: If no, request that the patient contact the pharmacy for the refill. If patient does not wish to contact the pharmacy document the reason why and proceed with request.) (Agent: If yes, when and what did the pharmacy advise?) Need new refill   This is the patient's preferred pharmacy:  [facility] [address], [address] [place] [address] Phone: [phone] Fax: [phone]  Is this the correct pharmacy for this prescription? Yes If no, delete pharmacy and type the correct one.   Has the prescription been filled recently? No  Is the patient out of the medication? No  Has the patient been seen for an appointment in the last year OR does the patient have an upcoming appointment? Yes  Can we respond through MyChart? No  Agent: Please be advised that Rx refills may take up to 3 business days. We ask that you follow-up with your pharmacy.

## 2023-11-12 ENCOUNTER — Other Ambulatory Visit: Payer: Self-pay

## 2023-11-12 MED ORDER — FOLIC ACID 1 MG PO TABS
1.0000 mg | ORAL_TABLET | Freq: Every day | ORAL | 0 refills | Status: DC
  Filled 2023-11-12: qty 90, 90d supply, fill #0

## 2023-11-12 MED ORDER — METOPROLOL TARTRATE 25 MG PO TABS
12.5000 mg | ORAL_TABLET | Freq: Two times a day (BID) | ORAL | 0 refills | Status: AC
Start: 2023-11-12 — End: ?
  Filled 2023-11-12: qty 90, 90d supply, fill #0

## 2023-11-12 MED ORDER — PANTOPRAZOLE SODIUM 40 MG PO TBEC
40.0000 mg | DELAYED_RELEASE_TABLET | Freq: Every day | ORAL | 0 refills | Status: DC
  Filled 2023-11-12: qty 90, 90d supply, fill #0

## 2023-11-12 MED ORDER — THIAMINE HCL 100 MG PO TABS
100.0000 mg | ORAL_TABLET | Freq: Every day | ORAL | 0 refills | Status: DC
  Filled 2023-11-12: qty 90, 90d supply, fill #0

## 2023-11-12 MED ORDER — LOPERAMIDE HCL 2 MG PO CAPS
2.0000 mg | ORAL_CAPSULE | Freq: Four times a day (QID) | ORAL | 0 refills | Status: DC | PRN
  Filled 2023-11-12: qty 12, 3d supply, fill #0

## 2023-11-14 ENCOUNTER — Telehealth: Payer: Self-pay

## 2023-11-14 ENCOUNTER — Ambulatory Visit: Payer: Self-pay | Admitting: Gastroenterology

## 2023-11-14 ENCOUNTER — Other Ambulatory Visit: Payer: Self-pay | Admitting: Gastroenterology

## 2023-11-14 ENCOUNTER — Ambulatory Visit (HOSPITAL_COMMUNITY)
Admission: RE | Admit: 2023-11-14 | Discharge: 2023-11-14 | Disposition: A | Discharge status: Home / Self Care | Source: Ambulatory Visit | Attending: Gastroenterology | Admitting: Gastroenterology

## 2023-11-14 DIAGNOSIS — R188 Other ascites: Secondary | ICD-10-CM

## 2023-11-14 DIAGNOSIS — R140 Abdominal distension (gaseous): Secondary | ICD-10-CM | POA: Diagnosis not present

## 2023-11-14 DIAGNOSIS — K7460 Unspecified cirrhosis of liver: Secondary | ICD-10-CM | POA: Insufficient documentation

## 2023-11-14 MED ORDER — LIDOCAINE-EPINEPHRINE 1 %-1:100000 IJ SOLN
INTRAMUSCULAR | Status: AC
  Filled 2023-11-14: qty 1

## 2023-11-14 NOTE — Telephone Encounter (Signed)
 Copied from CRM #[id number]. Topic: Referral - Request for Referral >> Nov 14, 2023  1:38 PM Rea C wrote: Did the patient discuss referral with their provider in the last year? Yes (If No - schedule appointment) (If Yes - send message)  Appointment offered? No  Type of order/referral and detailed reason for visit: Patient has a hernia and would like a referral to to have it checked/removed.  Preference of office, provider, location: Close to location   If referral order, have you been seen by this specialty before? Yes (If Yes, this issue or another issue? When? Where?  Can we respond through MyChart? No- [phone] (M)

## 2023-11-14 NOTE — Telephone Encounter (Signed)
 Does he need a visit for referral for hernia check/removal?  He does already see [facility]

## 2023-11-14 NOTE — Procedures (Signed)
 PROCEDURE SUMMARY:  Imaging of all 4 quadrants of the abdomen. There is only a small volume of ascites present.  Small volume ascites without pocket of fluid large enough to allow for safe approach for paracentesis.  Please see imaging section of Epic for full dictation.  Melinda Gwinner NP 11/14/2023 10:05 AM

## 2023-11-15 NOTE — Telephone Encounter (Signed)
 Called pt and advised that he will need to be seen for documentation purposes as they require this for the referral. Attempted to get him scheduled at [facility], pt states he will call back

## 2023-11-22 ENCOUNTER — Ambulatory Visit: Admitting: Family Medicine

## 2023-11-26 ENCOUNTER — Encounter: Payer: Self-pay | Admitting: Family Medicine

## 2023-11-26 ENCOUNTER — Ambulatory Visit: Admitting: Family Medicine

## 2023-11-26 VITALS — BP 130/90 | HR 88 | Temp 98.0°F | Ht 72.0 in | Wt 115.0 lb

## 2023-11-26 DIAGNOSIS — K429 Umbilical hernia without obstruction or gangrene: Secondary | ICD-10-CM

## 2023-11-26 DIAGNOSIS — I10 Essential (primary) hypertension: Secondary | ICD-10-CM | POA: Diagnosis not present

## 2023-11-26 DIAGNOSIS — R7989 Other specified abnormal findings of blood chemistry: Secondary | ICD-10-CM

## 2023-11-26 NOTE — Progress Notes (Signed)
 Subjective:     Patient ID: Robert Carney, male    DOB: 28-Dec-1965, 58 y.o.   MRN: [id number]  Chief Complaint  Patient presents with   Hernia    Would like surgical referral for hernia removal, lower part of abdomen     HPI  Discussed the use of AI scribe software for clinical note transcription with the patient, who gave verbal consent to proceed.  History of Present Illness Robert Carney is a 58 year old male who presents with concerns about an umbilical hernia.  Umbilical hernia - Present for approximately six months, initially identified during a gastroenterology visit - Bothersome when lying down - No associated pain - Concerned about cosmetic appearance and potential for enlargement, though uncertain if it has increased in size - No abdominal pain, fever, or chills      Health Maintenance Due  Topic Date Due   COVID-19 Vaccine (1) Never done   Pneumococcal Vaccine: 50+ Years (1 of 2 - PCV) Never done   Hepatitis B Vaccines 19-59 Average Risk (1 of 3 - 19+ 3-dose series) Never done   Zoster Vaccines- Shingrix  (1 of 2) Never done   Influenza Vaccine  10/18/2023    Past Medical History:  Diagnosis Date   Cervical myelopathy (HCC) 11/01/2016   Gait abnormality 11/01/2016   Hypertension    no longer taking meds    Past Surgical History:  Procedure Laterality Date   ANTERIOR CERVICAL CORPECTOMY N/A 03/26/2016   Procedure: Cervical  Seven Anterior cervical corpectomy;  Surgeon: Morene Hicks Ditty, MD;  Location: [facility] OR;  Service: Neurosurgery;  Laterality: N/A;  Cervical  Seven Anterior cervical corpectomy   ESOPHAGOGASTRODUODENOSCOPY (EGD) WITH PROPOFOL  N/A 12/26/2022   Procedure: ESOPHAGOGASTRODUODENOSCOPY (EGD) WITH PROPOFOL ;  Surgeon: Legrand Victory LITTIE DOUGLAS, MD;  Location: [facility] ENDOSCOPY;  Service: Gastroenterology;  Laterality: N/A;   tooth pulled      Family History  Problem Relation Age of Onset   Diabetes Mother    Lung cancer Mother    Hypertension  Mother    Prostate cancer Father    Hypertension Father    Breast cancer Sister    Breast cancer Maternal Aunt    Colon cancer Neg Hx    Colon polyps Neg Hx    Esophageal cancer Neg Hx    Rectal cancer Neg Hx    Stomach cancer Neg Hx     Social History   Socioeconomic History   Marital status: Single    Spouse name: Not on file   Number of children: 4   Years of education: 12   Highest education level: Not on file  Occupational History   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   Sexual activity: Yes  Other Topics Concern   Not on file  Social History Narrative   Lives  with mother, Robert Carney   Caffeine use: sometimes   Right handed   Social Drivers of Health   Financial Resource Strain: Low Risk  (08/08/2023)   Overall Financial Resource Strain (CARDIA)    Difficulty of Paying Living Expenses: Not hard at all  Food Insecurity: No Food Insecurity (08/08/2023)   Hunger Vital Sign    Worried About Running Out of Food in the Last Year: Never true    Ran Out of Food in the Last Year: Never true  Transportation Needs: No Transportation Needs (08/08/2023)   PRAPARE - Administrator, Civil Service (Medical): No    Lack of Transportation (Non-Medical): No  Physical Activity: Insufficiently Active (08/08/2023)   Exercise Vital Sign    Days of Exercise per Week: 3 days    Minutes of Exercise per Session: 40 min  Stress: No Stress Concern Present  (08/08/2023)   [employer] of Occupational Health - Occupational Stress Questionnaire    Feeling of Stress : Not at all  Social Connections: Moderately Integrated (08/08/2023)   Social Connection and Isolation Panel    Frequency of Communication with Friends and Family: More than three times a week    Frequency of Social Gatherings with Friends and Family: More than three times a week    Attends Religious Services: More than 4 times per year    Active Member of [employer] or Organizations: Yes    Attends Banker Meetings: More than 4 times per year    Marital Status: Never married  Intimate Partner Violence: Not At Risk (08/08/2023)   Humiliation, Afraid, Rape, and Kick questionnaire    Fear of Current or Ex-Partner: No    Emotionally Abused: No    Physically Abused: No    Sexually Abused: No    Outpatient Medications Prior to Visit  Medication Sig Dispense Refill   Cyanocobalamin  (B-12 PO) Take by mouth.     folic acid  (FOLVITE ) 1 MG tablet Take 1 tablet (1 mg total) by mouth daily. 90 tablet 0   loperamide  (IMODIUM ) 2 MG capsule Take 1 capsule (2 mg total) by mouth 4 (four) times daily as needed for diarrhea or loose stools. 12 capsule 0   metoprolol  tartrate (LOPRESSOR ) 25 MG tablet Take 0.5 tablets (12.5 mg total) by mouth 2 (two) times daily. 90 tablet 0   pantoprazole  (PROTONIX ) 40 MG tablet Take 1 tablet (40 mg total) by mouth daily. 90 tablet 0   potassium chloride  (KLOR-CON ) 10 MEQ tablet Take 2 tablets (20 mEq total) by mouth daily. 2 tablet 0   thiamine  (VITAMIN B1) 100 MG tablet Take 1 tablet (100 mg total) by mouth daily. 90 tablet 0   No facility-administered medications prior to visit.    Allergies  Allergen Reactions   No Known Allergies     Review of Systems  Constitutional:  Negative for chills, fever and malaise/fatigue.  Respiratory:  Negative for shortness of breath.   Cardiovascular:  Negative for chest pain, palpitations and leg swelling.   Gastrointestinal:  Negative for abdominal pain, constipation, diarrhea, nausea and vomiting.  Genitourinary:  Negative for dysuria, frequency and urgency.  Neurological:  Negative for dizziness, focal weakness and headaches.  Endo/Heme/Allergies:  Does not bruise/bleed easily.       Objective:    Physical Exam Constitutional:      General: He is not in acute distress.    Appearance: He is not ill-appearing.  HENT:     Mouth/Throat:     Mouth:  Mucous membranes are moist.     Pharynx: Oropharynx is clear.  Eyes:     Extraocular Movements: Extraocular movements intact.     Conjunctiva/sclera: Conjunctivae normal.  Cardiovascular:     Rate and Rhythm: Normal rate and regular rhythm.  Pulmonary:     Effort: Pulmonary effort is normal.     Breath sounds: Normal breath sounds.  Abdominal:     General: Bowel sounds are normal. There is distension.     Tenderness: There is no abdominal tenderness.     Hernia: A hernia is present. Hernia is present in the umbilical area.     Comments: Reducible umbilical hernia   Musculoskeletal:     Cervical back: Normal range of motion and neck supple.  Skin:    General: Skin is warm and dry.  Neurological:     General: No focal deficit present.     Mental Status: He is alert and oriented to person, place, and time.  Psychiatric:        Mood and Affect: Mood normal.        Behavior: Behavior normal.        Thought Content: Thought content normal.      BP (!) 130/90   Pulse 88   Temp 98 F (36.7 C) (Temporal)   Ht 6' (1.829 m)   Wt 115 lb (52.2 kg)   SpO2 97%   BMI 15.60 kg/m  Wt Readings from Last 3 Encounters:  11/26/23 115 lb (52.2 kg)  10/28/23 118 lb (53.5 kg)  09/13/23 117 lb (53.1 kg)       Assessment & Plan:   Problem List Items Addressed This Visit     Abnormal LFTs   Essential hypertension   Other Visit Diagnoses       Umbilical hernia without obstruction and without gangrene    -  Primary   Relevant Orders    Ambulatory referral to General Surgery       Assessment and Plan Assessment & Plan Umbilical hernia without obstruction or gangrene Umbilical hernia is bothersome but not painful, reducible, and present for approximately six months. He is considering surgery due to cosmetic concerns and discomfort while sleeping. - Refer to general surgery for evaluation and discussion of surgical options versus monitoring.  Abnormal liver function. Reviewed notes from recent GI visit. Being followed for liver disease   Hypertension -continue current medication and monitor BP at home.   Ascites No significant amount of ascites at recent GI visit per Dr. Legrand.  He reports no abdominal pain, fever, or chills.     I am having Robert Carney maintain his potassium chloride , Cyanocobalamin  (B-12 PO), folic acid , loperamide , metoprolol  tartrate, pantoprazole , and thiamine .  No orders of the defined types were placed in this encounter.

## 2023-12-09 DIAGNOSIS — K429 Umbilical hernia without obstruction or gangrene: Secondary | ICD-10-CM | POA: Diagnosis not present

## 2023-12-10 ENCOUNTER — Ambulatory Visit (INDEPENDENT_AMBULATORY_CARE_PROVIDER_SITE_OTHER): Admitting: Family Medicine

## 2023-12-10 ENCOUNTER — Encounter: Payer: Self-pay | Admitting: Family Medicine

## 2023-12-10 VITALS — BP 100/66 | HR 53 | Temp 97.9°F | Ht 72.0 in | Wt 115.0 lb

## 2023-12-10 DIAGNOSIS — E519 Thiamine deficiency, unspecified: Secondary | ICD-10-CM | POA: Diagnosis not present

## 2023-12-10 DIAGNOSIS — Z716 Tobacco abuse counseling: Secondary | ICD-10-CM

## 2023-12-10 DIAGNOSIS — M79672 Pain in left foot: Secondary | ICD-10-CM

## 2023-12-10 DIAGNOSIS — K760 Fatty (change of) liver, not elsewhere classified: Secondary | ICD-10-CM

## 2023-12-10 DIAGNOSIS — R001 Bradycardia, unspecified: Secondary | ICD-10-CM | POA: Diagnosis not present

## 2023-12-10 DIAGNOSIS — R7989 Other specified abnormal findings of blood chemistry: Secondary | ICD-10-CM

## 2023-12-10 DIAGNOSIS — E7800 Pure hypercholesterolemia, unspecified: Secondary | ICD-10-CM

## 2023-12-10 DIAGNOSIS — M79671 Pain in right foot: Secondary | ICD-10-CM | POA: Diagnosis not present

## 2023-12-10 DIAGNOSIS — M21619 Bunion of unspecified foot: Secondary | ICD-10-CM

## 2023-12-10 DIAGNOSIS — I10 Essential (primary) hypertension: Secondary | ICD-10-CM

## 2023-12-10 DIAGNOSIS — F1910 Other psychoactive substance abuse, uncomplicated: Secondary | ICD-10-CM

## 2023-12-10 DIAGNOSIS — Z1159 Encounter for screening for other viral diseases: Secondary | ICD-10-CM

## 2023-12-10 DIAGNOSIS — E538 Deficiency of other specified B group vitamins: Secondary | ICD-10-CM | POA: Diagnosis not present

## 2023-12-10 DIAGNOSIS — H539 Unspecified visual disturbance: Secondary | ICD-10-CM | POA: Diagnosis not present

## 2023-12-10 LAB — VITAMIN B12: Vitamin B-12: 370 pg/mL (ref 211–911)

## 2023-12-10 LAB — CBC
HCT: 42 % (ref 39.0–52.0)
Hemoglobin: 13.7 g/dL (ref 13.0–17.0)
MCHC: 32.5 g/dL (ref 30.0–36.0)
MCV: 92.9 fl (ref 78.0–100.0)
Platelets: 183 K/uL (ref 150.0–400.0)
RBC: 4.52 Mil/uL (ref 4.22–5.81)
RDW: 14.4 % (ref 11.5–15.5)
WBC: 5.4 K/uL (ref 4.0–10.5)

## 2023-12-10 LAB — MAGNESIUM: Magnesium: 1.9 mg/dL (ref 1.5–2.5)

## 2023-12-10 LAB — LIPID PANEL
Cholesterol: 134 mg/dL (ref 0–200)
HDL: 56.1 mg/dL (ref >39.00)
LDL Cholesterol: 56 mg/dL (ref 0–99)
NonHDL: 78.11
Total CHOL/HDL Ratio: 2
Triglycerides: 111 mg/dL (ref 0.0–149.0)
VLDL: 22.2 mg/dL (ref 0.0–40.0)

## 2023-12-10 LAB — BASIC METABOLIC PANEL WITH GFR
BUN: 6 mg/dL (ref 6–23)
CO2: 29 meq/L (ref 19–32)
Calcium: 9.4 mg/dL (ref 8.4–10.5)
Chloride: 101 meq/L (ref 96–112)
Creatinine, Ser: 0.69 mg/dL (ref 0.40–1.50)
GFR: 101.39 mL/min (ref >60.00)
Glucose, Bld: 79 mg/dL (ref 70–99)
Potassium: 4.5 meq/L (ref 3.5–5.1)
Sodium: 137 meq/L (ref 135–145)

## 2023-12-10 LAB — TSH: TSH: 3.03 u[IU]/mL (ref 0.35–5.50)

## 2023-12-10 LAB — T4, FREE: Free T4: 0.85 ng/dL (ref 0.60–1.60)

## 2023-12-10 NOTE — Progress Notes (Signed)
 Subjective:     Patient ID: Robert Carney, male    DOB: December 18, 1965, 58 y.o.   MRN: [id number]  Chief Complaint  Patient presents with   Medical Management of Chronic Issues    6 month f/u, needs eye dr and foot dr     HPI  Discussed the use of AI scribe software for clinical note transcription with the patient, who gave verbal consent to proceed.  History of Present Illness Robert Carney is a 58 year old male who presents for a chronic health follow-up focusing on bradycardia.  Bradycardia and cardiac symptoms - No dizziness, lightheadedness, or syncope - Takes metoprolol , half a pill daily, which lowers his pulse - Has not followed up with cardiology despite a previous abnormal echocardiogram in June 2025 -is not on statin therapy and never has been   Substance use - Smokes tobacco and marijuana - Drinks alcohol twice a week, with reduced intake -denies IV drug use   Physical activity and general well-being - Takes daily walks - Feels good overall  - Difficulty seeing both up close and far away; needs eye exam  Gastrointestinal symptoms - Takes pantoprazole  - Has Imodium  at home - No chronic diarrhea  Musculoskeletal symptoms - Bunions causing discomfort - No history of bunion surgery - Requests referral to have bunions evaluated   Vitamin supplementation - Takes B1 vitamin, folic acid , and B12     Health Maintenance Due  Topic Date Due   Pneumococcal Vaccine: 50+ Years (1 of 2 - PCV) Never done   Hepatitis B Vaccines 19-59 Average Risk (1 of 3 - 19+ 3-dose series) Never done   Zoster Vaccines- Shingrix  (1 of 2) Never done    Past Medical History:  Diagnosis Date   Cervical myelopathy (HCC) 11/01/2016   Gait abnormality 11/01/2016   Hypertension    no longer taking meds    Past Surgical History:  Procedure Laterality Date   ANTERIOR CERVICAL CORPECTOMY N/A 03/26/2016   Procedure: Cervical  Seven Anterior cervical corpectomy;  Surgeon: Morene Hicks Ditty, MD;  Location: [facility] OR;  Service: Neurosurgery;  Laterality: N/A;  Cervical  Seven Anterior cervical corpectomy   ESOPHAGOGASTRODUODENOSCOPY (EGD) WITH PROPOFOL  N/A 12/26/2022   Procedure: ESOPHAGOGASTRODUODENOSCOPY (EGD) WITH PROPOFOL ;  Surgeon: Legrand Victory LITTIE DOUGLAS, MD;  Location: [facility] ENDOSCOPY;  Service: Gastroenterology;  Laterality: N/A;   tooth pulled      Family History  Problem Relation Age of Onset   Diabetes Mother    Lung cancer Mother    Hypertension Mother    Prostate cancer Father    Hypertension Father    Breast cancer Sister    Breast cancer Maternal Aunt    Colon cancer Neg Hx    Colon polyps Neg Hx    Esophageal cancer Neg Hx    Rectal cancer Neg Hx    Stomach cancer Neg Hx     Social History   Socioeconomic History   Marital status: Single    Spouse name: Not on file   Number of children: 4   Years of education: 12   Highest education level: Not on file  Occupational History   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   Sexual activity: Yes  Other Topics Concern   Not on file  Social History Narrative   Lives with mother, Dickey   Caffeine use: sometimes   Right handed   Social Drivers of Health   Financial Resource Strain: Low Risk  (08/08/2023)   Overall Financial Resource Strain (CARDIA)    Difficulty of Paying Living Expenses: Not hard at all  Food Insecurity: No Food Insecurity (08/08/2023)   Hunger Vital Sign     Worried About Running Out of Food in the Last Year: Never true    Ran Out of Food in the Last Year: Never true  Transportation Needs: No Transportation Needs (08/08/2023)   PRAPARE - Administrator, Civil Service (Medical): No    Lack of Transportation (Non-Medical): No  Physical Activity: Insufficiently Active (08/08/2023)   Exercise Vital Sign    Days of Exercise per Week: 3 days    Minutes of Exercise per Session: 40 min  Stress: No Stress Concern Present (08/08/2023)   [employer] of Occupational Health - Occupational Stress Questionnaire    Feeling of Stress : Not at all  Social Connections: Moderately Integrated (08/08/2023)   Social Connection and Isolation Panel    Frequency of Communication with Friends and Family: More than three times a week    Frequency of Social Gatherings with Friends and Family: More than three times a week    Attends Religious Services: More than 4 times per year    Active Member of [employer] or Organizations: Yes    Attends Banker Meetings: More than 4 times per year    Marital Status: Never married  Intimate Partner Violence: Not At Risk (08/08/2023)   Humiliation, Afraid, Rape, and Kick questionnaire    Fear of Current or Ex-Partner: No    Emotionally Abused: No    Physically Abused: No    Sexually Abused: No    Outpatient Medications Prior to Visit  Medication Sig Dispense Refill   Cyanocobalamin  (B-12 PO) Take by mouth.     folic acid  (FOLVITE ) 1 MG tablet Take 1 tablet (1 mg total) by mouth daily. 90 tablet 0   metoprolol  tartrate (LOPRESSOR ) 25 MG tablet Take 0.5 tablets (12.5 mg total) by mouth 2 (two) times daily. 90 tablet 0   pantoprazole  (PROTONIX ) 40 MG tablet Take 1 tablet (40 mg total) by mouth daily. 90 tablet 0   potassium chloride  (KLOR-CON ) 10 MEQ tablet Take 2 tablets (20 mEq total) by mouth daily. 2 tablet 0   thiamine  (VITAMIN B1) 100 MG tablet Take 1 tablet (100 mg total) by mouth daily. 90 tablet 0    loperamide  (IMODIUM ) 2 MG capsule Take 1 capsule (2 mg total) by mouth 4 (four) times daily as needed for diarrhea or loose stools. 12 capsule 0   No facility-administered medications prior to visit.    Allergies  Allergen Reactions   No Known Allergies     Review of Systems  Constitutional:  Negative for chills, fever and malaise/fatigue.  Eyes:  Negative for double vision and pain.       Trouble seeing TV  for a while.   Respiratory:  Negative for shortness of breath.   Cardiovascular:  Negative for chest pain, palpitations and leg swelling.  Gastrointestinal:  Negative for abdominal pain, constipation, diarrhea, nausea and vomiting.  Genitourinary:  Negative for dysuria, frequency and urgency.  Neurological:  Negative for dizziness, focal weakness and headaches.       Objective:    Physical Exam Constitutional:      General: He is not in acute distress.    Appearance: He is not ill-appearing.  HENT:     Mouth/Throat:     Mouth: Mucous membranes are moist.     Pharynx: Oropharynx is clear.  Eyes:     Extraocular Movements: Extraocular movements intact.     Conjunctiva/sclera: Conjunctivae normal.     Pupils: Pupils are equal, round, and reactive to light.  Cardiovascular:     Rate and Rhythm: Regular rhythm. Bradycardia present.  Pulmonary:     Effort: Pulmonary effort is normal.     Breath sounds: Normal breath sounds.  Musculoskeletal:     Cervical back: Normal range of motion and neck supple.     Right lower leg: No edema.     Left lower leg: No edema.  Skin:    General: Skin is warm and dry.  Neurological:     General: No focal deficit present.     Mental Status: He is alert and oriented to person, place, and time.     Motor: No weakness.     Coordination: Coordination normal.     Gait: Gait normal.  Psychiatric:        Mood and Affect: Mood normal.        Behavior: Behavior normal.        Thought Content: Thought content normal.      BP 100/66    Pulse (!) 53   Temp 97.9 F (36.6 C) (Temporal)   Ht 6' (1.829 m)   Wt 115 lb (52.2 kg)   SpO2 99%   BMI 15.60 kg/m  Wt Readings from Last 3 Encounters:  12/10/23 115 lb (52.2 kg)  11/26/23 115 lb (52.2 kg)  10/28/23 118 lb (53.5 kg)       Assessment & Plan:   Problem List Items Addressed This Visit     Abnormal LFTs   Essential hypertension - Primary   Relevant Orders   CBC   Basic metabolic panel with GFR   TSH   T4, free   EKG 12-Lead   Hepatic steatosis   Polysubstance abuse (HCC)   Other Visit Diagnoses       Bradycardia       Relevant Orders   CBC   Basic metabolic panel with GFR   TSH   T4, free   Magnesium   EKG 12-Lead     Pain in both feet       Relevant Orders   Ambulatory referral to Podiatry     Bunion       Relevant Orders   Ambulatory referral to Podiatry     Vision changes       Relevant Orders   Ambulatory referral to Ophthalmology     Vitamin B12 deficiency       Relevant Orders   Vitamin B12     Vitamin B1 deficiency       Relevant Orders   Vitamin B1     Pure hypercholesterolemia       Relevant Orders   Lipid panel  Encounter for hepatitis C screening test for low risk patient         Tobacco abuse counseling           Assessment and Plan Assessment & Plan Bradycardia New onset bradycardia with a pulse of 53 bpm, previously recorded at 88 bpm. No symptoms of dizziness, lightheadedness, or syncope. - Perform EKG to assess heart rhythm -EKG shows sinus bradycardia, rate 50, left anterior fascicular block, no QT prolongation as previous EKG showed. No ST elevation  -reviewed echocardiogram which showed mild thickening of the mitral valve leaflet(s)  Umbilical hernia without obstruction or gangrene Scheduled for surgical repair. No current symptoms of obstruction or gangrene. Informed about the risks of smoking on surgical outcomes, including potential infection.  Bilateral bunions Bilateral bunions causing mild  discomfort. -referral to podiatry as requested   Impaired visual acuity Reports difficulty seeing both up close and far away. No history of eye examination. - Refer to ophthalmology for comprehensive eye examination   Hypertension -on metoprolol . Well controlled. Asymptomatic    Hyperlipidemia -no recent lipid panel. Check lipids, calculate ASCVD and start statin therapy which was discussed with patient   Tobacco abuse counseling - Encouraged smoking cessation - Recommend developing a plan of when he smokes and healthy substitute to replace smoking  I also encouraged alcohol cessation   I have discontinued Asaph L. Giangregorio's loperamide . I am also having him maintain his potassium chloride , Cyanocobalamin  (B-12 PO), folic acid , metoprolol  tartrate, pantoprazole , and thiamine .  No orders of the defined types were placed in this encounter.

## 2023-12-10 NOTE — Patient Instructions (Addendum)
 Please go downstairs for labs before you leave  I encourage you to work on stopping smoking and alcohol    Managing the Challenge of Quitting Smoking Quitting smoking is a physical and mental challenge. You may have cravings, withdrawal symptoms, and temptation to smoke. Before quitting, work with your health care provider to make a plan that can help you manage quitting. Making a plan before you quit may keep you from smoking when you have the urge to smoke while trying to quit. How to manage lifestyle changes Managing stress Stress can make you want to smoke, and wanting to smoke may cause stress. It is important to find ways to manage your stress. You could try some of the following: Practice relaxation techniques. Breathe slowly and deeply, in through your nose and out through your mouth. Listen to music. Soak in a bath or take a shower. Imagine a peaceful place or vacation. Get some support. Talk with family or friends about your stress. Join a support group. Talk with a counselor or therapist. Get some physical activity. Go for a walk, run, or bike ride. Play a favorite sport. Practice yoga.  Medicines Talk with your health care provider about medicines that might help you deal with cravings and make quitting easier for you. Relationships Social situations can be difficult when you are quitting smoking. To manage this, you can: Avoid parties and other social situations where people might be smoking. Avoid alcohol. Leave right away if you have the urge to smoke. Explain to your family and friends that you are quitting smoking. Ask for support and let them know you might be a bit grumpy. Plan activities where smoking is not an option. General instructions Be aware that many people gain weight after they quit smoking. However, not everyone does. To keep from gaining weight, have a plan in place before you quit, and stick to the plan after you quit. Your plan should  include: Eating healthy snacks. When you have a craving, it may help to: Eat popcorn, or try carrots, celery, or other cut vegetables. Chew sugar-free gum. Changing how you eat. Eat small portion sizes at meals. Eat 4-6 small meals throughout the day instead of 1-2 large meals a day. Be mindful when you eat. You should avoid watching television or doing other things that might distract you as you eat. Exercising regularly. Make time to exercise each day. If you do not have time for a long workout, do short bouts of exercise for 5-10 minutes several times a day. Do some form of strengthening exercise, such as weight lifting. Do some exercise that gets your heart beating and causes you to breathe deeply, such as walking fast, running, swimming, or biking. This is very important. Drinking plenty of water or other low-calorie or no-calorie drinks. Drink enough fluid to keep your urine pale yellow.  How to recognize withdrawal symptoms Your body and mind may experience discomfort as you try to get used to not having nicotine  in your system. These effects are called withdrawal symptoms. They may include: Feeling hungrier than normal. Having trouble concentrating. Feeling irritable or restless. Having trouble sleeping. Feeling depressed. Craving a cigarette. These symptoms may surprise you, but they are normal to have when quitting smoking. To manage withdrawal symptoms: Avoid places, people, and activities that trigger your cravings. Remember why you want to quit. Get plenty of sleep. Avoid coffee and other drinks that contain caffeine. These may worsen some of your symptoms. How to manage cravings Come up  with a plan for how to deal with your cravings. The plan should include the following: A definition of the specific situation you want to deal with. An activity or action you will take to replace smoking. A clear idea for how this action will help. The name of someone who could help you  with this. Cravings usually last for 5-10 minutes. Consider taking the following actions to help you with your plan to deal with cravings: Keep your mouth busy. Chew sugar-free gum. Suck on hard candies or a straw. Brush your teeth. Keep your hands and body busy. Change to a different activity right away. Squeeze or play with a ball. Do an activity or a hobby, such as making bead jewelry, practicing needlepoint, or working with wood. Mix up your normal routine. Take a short exercise break. Go for a quick walk, or run up and down stairs. Focus on doing something kind or helpful for someone else. Call a friend or family member to talk during a craving. Join a support group. Contact a quitline. Where to find support To get help or find a support group: Call the National Cancer Institute's Smoking Quitline: 1-800-QUIT-NOW (813)249-5777) Text QUIT to SmokefreeTXT: 521151 Where to find more information Visit these websites to find more information on quitting smoking: U.S. Department of Health and Human Services: www.smokefree.gov American Lung Association: www.freedomfromsmoking.org Centers for Disease Control and Prevention (CDC): FootballExhibition.com.br American Heart Association: www.heart.org Contact a health care provider if: You want to change your plan for quitting. The medicines you are taking are not helping. Your eating feels out of control or you cannot sleep. You feel depressed or become very anxious. Summary Quitting smoking is a physical and mental challenge. You will face cravings, withdrawal symptoms, and temptation to smoke again. Preparation can help you as you go through these challenges. Try different techniques to manage stress, handle social situations, and prevent weight gain. You can deal with cravings by keeping your mouth busy (such as by chewing gum), keeping your hands and body busy, calling family or friends, or contacting a quitline for people who want to quit smoking. You  can deal with withdrawal symptoms by avoiding places where people smoke, getting plenty of rest, and avoiding drinks that contain caffeine. This information is not intended to replace advice given to you by your health care provider. Make sure you discuss any questions you have with your health care provider. Document Revised: 02/24/2021 Document Reviewed: 02/24/2021 Elsevier Patient Education  2024 [employer].

## 2023-12-11 LAB — HEPATITIS C ANTIBODY: Hepatitis C Ab: NONREACTIVE

## 2023-12-14 LAB — VITAMIN B1: Vitamin B1 (Thiamine): 38 nmol/L — ABNORMAL HIGH (ref 8–30)

## 2023-12-15 ENCOUNTER — Ambulatory Visit: Payer: Self-pay | Admitting: Family Medicine

## 2023-12-15 NOTE — Progress Notes (Signed)
 He can cut back on the vitamin B1 and take it only 4- 5 days weekly since his vitamin B1 level is still slightly elevated.  Otherwise, his labs are good and no new concerns

## 2023-12-20 ENCOUNTER — Ambulatory Visit (INDEPENDENT_AMBULATORY_CARE_PROVIDER_SITE_OTHER)

## 2023-12-20 VITALS — Ht 72.0 in | Wt 115.0 lb

## 2023-12-20 DIAGNOSIS — M2042 Other hammer toe(s) (acquired), left foot: Secondary | ICD-10-CM | POA: Diagnosis not present

## 2023-12-20 DIAGNOSIS — M2041 Other hammer toe(s) (acquired), right foot: Secondary | ICD-10-CM

## 2023-12-20 DIAGNOSIS — M21619 Bunion of unspecified foot: Secondary | ICD-10-CM | POA: Diagnosis not present

## 2023-12-20 DIAGNOSIS — M21612 Bunion of left foot: Secondary | ICD-10-CM

## 2023-12-20 DIAGNOSIS — M21611 Bunion of right foot: Secondary | ICD-10-CM | POA: Diagnosis not present

## 2023-12-20 DIAGNOSIS — G6289 Other specified polyneuropathies: Secondary | ICD-10-CM | POA: Diagnosis not present

## 2023-12-20 NOTE — Progress Notes (Signed)
 Subjective:  Patient ID: Robert Carney, male    DOB: 05-08-1965,  MRN: [id number]  Chief Complaint  Patient presents with   Bunions    RM 10 Patient is here for bilateral bunions. Pt states a tingling sensation in both feet.    58 y.o. male presents with the above complaint. He states he has had bunion pain for a significant amount of pain. Deformity has been present for years.  He states that it causes limitation in shoe gear, tight shoes do cause him pain to the medial eminence.  He is not experiencing 1st MTP joint pain.  He does relate to minor burning and tingling sensation.  Review of Systems: Negative except as noted in the HPI. Denies N/V/F/Ch.  Past Medical History:  Diagnosis Date   Cervical myelopathy (HCC) 11/01/2016   Gait abnormality 11/01/2016   Hypertension    no longer taking meds    Current Outpatient Medications:    Cyanocobalamin  (B-12 PO), Take by mouth., Disp: , Rfl:    folic acid  (FOLVITE ) 1 MG tablet, Take 1 tablet (1 mg total) by mouth daily., Disp: 90 tablet, Rfl: 0   metoprolol  tartrate (LOPRESSOR ) 25 MG tablet, Take 0.5 tablets (12.5 mg total) by mouth 2 (two) times daily., Disp: 90 tablet, Rfl: 0   pantoprazole  (PROTONIX ) 40 MG tablet, Take 1 tablet (40 mg total) by mouth daily., Disp: 90 tablet, Rfl: 0   potassium chloride  (KLOR-CON ) 10 MEQ tablet, Take 2 tablets (20 mEq total) by mouth daily., Disp: 2 tablet, Rfl: 0   thiamine  (VITAMIN B1) 100 MG tablet, Take 1 tablet (100 mg total) by mouth daily., Disp: 90 tablet, Rfl: 0  Social History   Tobacco Use  Smoking Status Some Days   Current packs/day: 0.20   Average packs/day: 0.2 packs/day for 34.8 years (7.0 ttl pk-yrs)   Types: Cigarettes   Start date: 03/19/1989   Passive exposure: Current  Smokeless Tobacco Never    Allergies  Allergen Reactions   No Known Allergies    Objective:  There were no vitals filed for this visit. Body mass index is 15.6 kg/m. Constitutional Well  developed. Well nourished. Oriented to person, place, and time.  Vascular Dorsalis pedis pulses palpable bilaterally. Posterior tibial pulses palpable bilaterally. Capillary refill normal to all digits.  No cyanosis or clubbing noted. Pedal hair growth normal.  Neurologic Normal speech. Epicritic sensation to light touch grossly present bilaterally. Negative tinel sign at tarsal tunnel bilaterally.   Dermatologic Skin texture and turgor are within normal limits.  No open wounds. No skin lesions.  Musculoskeletal: 5 out of 5 muscle strength to all major pedal muscle groups.  Rectus foot shape with very wide forefoot.  Bunion deformity present bilaterally.  Pain with palpation of medial eminence.  No pain with range of motion first MTP bilaterally, left side does have slight crepitus upon max dorsiflexion that is nonpainful.  Mild hypermobility present.  Hammertoes 2 through 5 present and flexible.   Radiographs: Taken and reviewed.  Bilateral 3 weightbearing views were taken today.  These demonstrate bilateral hallux abductovalgus deformities.  15.2 degrees IM 1-2 on the left, 14.5 on right.  Tibial sesamoid position of 5 bilaterally.  Mild elevation of first ray bilaterally.  No significant hindfoot arthropathy or deformity contributing.  No acute osseous lesions.  Assessment:   1. Bunion   2. Other polyneuropathy   3. Hammertoe, bilateral    Plan:  - Patient was evaluated and treated and all questions answered.  1. Bunion, hammertoes -Patient does have bilateral hallux abductovalgus deformities that are causing him issues in and out of shoe gear.  We did discuss treatments for this ranging from conservative to surgical.  We will start with conservative treatments. -Patient was provided with 2 bunion silicone cushions today to help alleviate symptoms from rubbing against shoe gear -We discussed utilization of wide toebox shoes with good support to decrease pressure -We did discuss  that if surgical intervention does become necessary, the patient would likely be a candidate for Lapidus bunionectomy, hammertoe correction via flexor tendon transfer to 2 through 5, possible gastrocnemius recession, possible second metatarsal Weil osteotomy.  We did discuss that he would have to be nonweightbearing for approximately 4 weeks following the procedure. He expresses understanding of this.  -We discussed smoking cessation as part of surgical workup, he expresses understanding -He is to return to clinic as needed, he will return if he would like to discuss surgical intervention  Neuropathy -Patient has mild neuropathic symptoms of burning and tingling.  He does not feel that this is related to the pain from his bunion deformity.  He has maintained protective sensation.  He does not feel that he needs the neuropathy treated at this time.   Prentice Ovens, DPM AACFAS Fellowship Trained Podiatric Surgeon [facility]

## 2024-01-16 ENCOUNTER — Encounter: Payer: Self-pay | Admitting: Gastroenterology

## 2024-01-16 ENCOUNTER — Ambulatory Visit (INDEPENDENT_AMBULATORY_CARE_PROVIDER_SITE_OTHER): Admitting: Gastroenterology

## 2024-01-16 VITALS — BP 110/50 | HR 85 | Ht 72.0 in | Wt 120.1 lb

## 2024-01-16 DIAGNOSIS — Z23 Encounter for immunization: Secondary | ICD-10-CM

## 2024-01-16 DIAGNOSIS — K709 Alcoholic liver disease, unspecified: Secondary | ICD-10-CM | POA: Diagnosis not present

## 2024-01-16 DIAGNOSIS — F1090 Alcohol use, unspecified, uncomplicated: Secondary | ICD-10-CM

## 2024-01-16 DIAGNOSIS — R7989 Other specified abnormal findings of blood chemistry: Secondary | ICD-10-CM

## 2024-01-16 NOTE — Patient Instructions (Addendum)
 Follow up in 3 months.   Thank you for trusting me with your gastrointestinal care!    Dr. Victory Legrand DOUGLAS [facility]

## 2024-01-16 NOTE — Progress Notes (Signed)
 [facility]  Chief Complaint: Elevated LFTs, alcohol-related liver disease  Subjective  Prior history  Summary from 10/28/2023 APP office visit: Patient admitted to the hospital 12/25/2022 to 12/27/2022 for acute GI bleed after presenting to the ED with complaint of dizziness and noted to have FOBT positive stool.  Hemoglobin dropped to 9.7.  He underwent EGD which was unremarkable.  Placed on PPI.   Patient last seen in office 08/14/2023 by Harlene Mail, PA-C for complaint of abdominal bloating and distention.    CT A/P 03/2023 showed large volume ascites of unclear etiology, fatty liver, cholelithiasis, areas of apparent wall thickening in the right colon and transverse colon, though colon was decompressed and difficult to evaluate, circumferential wall thickening in the visualized distal esophagus suggesting esophagitis.   EGD 12/2022 was normal.  Colonoscopy 06/2022 revealed a few left-sided diverticuli and internal hemorrhoids, otherwise normal.   History of alcohol use, noted to smell like alcohol at last visit.     Labs 08/14/2023: INR 1.3, PT 13.2, alk phos 280, AST 139, ALT 44, albumin 2.9, total bilirubin 1.1, hemoglobin 10.5, platelets 175, Peth positive   Abdominal ultrasound with paracentesis was ordered and showed small volume ascites without pocket of fluid large enough to allow for safe approach for paracentesis on 08/16/2023.   Labs 10/04/2023: INR 1.3, PT 13.7, hemoglobin 11.7, platelets 161, alk phos 245, AST 101, ALT 28, albumin 3   Has been advised to abstain from alcohol.   2D echo looked good with no sign of heart failure.  Repeat labs stable to minimally improved and advised to continue alcohol cessation.  _______________  PETH level > 2000 in May 2025 and 1237 in August 2025   History of Present Illness  [facility] was here today to follow-up on his elevated liver labs. He is still drinking regularly, somewhat evasive about the amount except to  say it is less than before. I brought up his significantly elevated PETH level and my concern that his excess alcohol use is causing serious liver issues and he is risking cirrhosis if that continues.  He denies abdominal pain nausea or vomiting.  Has not noticed any change in his abdominal girth he has not developed peripheral edema.  No falls or blackouts.  Denies chest pain dyspnea or dysuria   ROS: Cardiovascular:  no chest pain Respiratory: no dyspnea Chronic pain in both legs and feet for which he reports being on disability  The patient's Past Medical, Family and Social History were reviewed and are on file in the EMR. Past Medical History:  Diagnosis Date   Cervical myelopathy (HCC) 11/01/2016   Gait abnormality 11/01/2016   Hypertension    no longer taking meds    Past Surgical History:  Procedure Laterality Date   ANTERIOR CERVICAL CORPECTOMY N/A 03/26/2016   Procedure: Cervical  Seven Anterior cervical corpectomy;  Surgeon: Morene Hicks Ditty, MD;  Location: [facility] OR;  Service: Neurosurgery;  Laterality: N/A;  Cervical  Seven Anterior cervical corpectomy   ESOPHAGOGASTRODUODENOSCOPY (EGD) WITH PROPOFOL  N/A 12/26/2022   Procedure: ESOPHAGOGASTRODUODENOSCOPY (EGD) WITH PROPOFOL ;  Surgeon: Legrand Victory LITTIE DOUGLAS, MD;  Location: [facility] ENDOSCOPY;  Service: Gastroenterology;  Laterality: N/A;   tooth pulled       Objective:  Med list reviewed  Current Outpatient Medications:    Cyanocobalamin  (B-12 PO), Take by mouth., Disp: , Rfl:    folic acid  (FOLVITE ) 1 MG tablet, Take 1 tablet (1 mg total) by mouth daily., Disp: 90 tablet,  Rfl: 0   metoprolol  tartrate (LOPRESSOR ) 25 MG tablet, Take 0.5 tablets (12.5 mg total) by mouth 2 (two) times daily., Disp: 90 tablet, Rfl: 0   pantoprazole  (PROTONIX ) 40 MG tablet, Take 1 tablet (40 mg total) by mouth daily., Disp: 90 tablet, Rfl: 0   potassium chloride  (KLOR-CON ) 10 MEQ tablet, Take 2 tablets (20 mEq total) by mouth daily., Disp: 2 tablet,  Rfl: 0   thiamine  (VITAMIN B1) 100 MG tablet, Take 1 tablet (100 mg total) by mouth daily., Disp: 90 tablet, Rfl: 0   Vital signs in last 24 hrs: Vitals:   01/16/24 1540  BP: (!) 110/50  Pulse: 85   Wt Readings from Last 3 Encounters:  01/16/24 120 lb 2 oz (54.5 kg)  12/20/23 115 lb (52.2 kg)  12/10/23 115 lb (52.2 kg)    Physical Exam  Thin, poor muscle mass HEENT: sclera anicteric, oral mucosa moist without lesions Neck: supple, no thyromegaly, JVD or lymphadenopathy Cardiac: Regular without appreciable murmur,  no peripheral edema Pulm: clear to auscultation bilaterally, normal RR and effort noted Abdomen: soft, no tenderness, with active bowel sounds.  Nondistended, no discernible ascites.  Liver edge is felt 3 fingerbreadths below the right costal margin on inspiration Skin; warm and dry, no jaundice or rash   Labs:     Latest Ref Rng & Units 12/10/2023   10:15 AM 10/28/2023   11:50 AM 10/04/2023   12:21 PM  CBC  WBC 4.0 - 10.5 K/uL 5.4  6.0  5.1   Hemoglobin 13.0 - 17.0 g/dL 86.2  87.5  88.2   Hematocrit 39.0 - 52.0 % 42.0  37.8  35.4   Platelets 150.0 - 400.0 K/uL 183.0  166.0  161.0       Latest Ref Rng & Units 12/10/2023   10:15 AM 10/28/2023   11:50 AM 10/04/2023   12:21 PM  CMP  Glucose 70 - 99 mg/dL 79  73  94   BUN 6 - 23 mg/dL 6  6  4    Creatinine 0.40 - 1.50 mg/dL 9.30  9.36  9.34   Sodium 135 - 145 mEq/L 137  133  133   Potassium 3.5 - 5.1 mEq/L 4.5  3.9  3.4   Chloride 96 - 112 mEq/L 101  98  97   CO2 19 - 32 mEq/L 29  29  27    Calcium 8.4 - 10.5 mg/dL 9.4  8.7  8.5   Total Protein 6.0 - 8.3 g/dL  7.9  8.0   Total Bilirubin 0.2 - 1.2 mg/dL  0.5  0.7   Alkaline Phos 39 - 117 U/L  203  245   AST 0 - 37 U/L  79  101   ALT 0 - 53 U/L  20  28    Lab Results  Component Value Date   INR 1.2 (H) 10/28/2023   INR 1.3 (H) 10/04/2023   INR 1.3 (H) 08/14/2023  September 2024:  B12 370  Hep C antibody negative TSH normal  Negative hepatitis B  surface antibody in 2018 ___________________________________________ Radiologic studies: Narrative & Impression  INDICATION: cirrhosis of the liver with ascites 59 year old male with a history of ascites of unclear etiology on 04/09/2023. He presented to his GI on 5/28 with complaints of intermittent abdominal distension. Request for diagnostic and therapeutic paracentesis. Previously evaluated by IR for paracentesis 08/16/23 with no safe window at that time.   EXAM: ULTRASOUND ABDOMEN LIMITED   FINDINGS: Preprocedural limited US  Abdomen, prior to  the scheduled paracentesis.   Imaging of all 4 quadrants of the abdomen. There is only a small volume of ascites present, without safe approach for paracentesis.   IMPRESSION: Small volume ascites.  Paracentesis was NOT performed.   Interpreted by Laymon Coast, NP under supervision of Thom Hall, MD     Electronically Signed   By: Thom Hall M.D.   On: 11/14/2023 10:20    ____________________________________________ Other:   _____________________________________________   Encounter Diagnoses  Name Primary?   LFTs abnormal Yes   Alcohol use disorder    Alcoholic liver disease    Need for prophylactic vaccination against viral hepatitis     Assessment & Plan  He does not have any physical exam stigmata of cirrhosis, and his CT scan from earlier this year did not have definite evidence of that in the way of hypertrophied caudate lobe or portal venous collaterals.  There was a large amount of ascites on that exam, but on subsequent ultrasounds including August of this year, there is still little fluid that it is not amenable to paracentesis.  It is possible that he has had ascites from chronic alcohol related hepatitis, but he most likely does have cirrhosis.  I have made it clear in no uncertain terms that no matter the degree of liver damage that he has at this point, he is risking worsening liver damage and liver  failure in the future if he continues to drink alcohol at all.  Is not clear to what extent he has insight into that, but says he will work toward alcohol cessation.  I recommended against sudden abstinence since he may go into alcohol withdrawal, but we better for him to slowly cut down over the course of about a week if possible. He was offered resources for AA and other community counseling, but he says he can handle this on his own.   Plan: I also explained that he should be vaccinated for hepatitis A and B because if he contracts those at any point, he could have more severe hepatitis and even die of liver failure given his already compromised liver from alcohol use.  He was agreeable to that, and received his first dose of both the hepatitis A and B vaccines today.  Second hepatitis A in 6 months, second hepatitis B in a month.  He will return to see us  in clinic in about 3 months.  At that point we will be time for updated lab work including CBC, CMP, INR, AFP and an abdominal ultrasound both for ascites assessment and HCC screening.  30 minutes were spent on this encounter (including chart review, history/exam, counseling/coordination of care, and documentation) > 50% of that time was spent on counseling and coordination of care.   Victory LITTIE Brand III

## 2024-02-11 ENCOUNTER — Ambulatory Visit: Admitting: Family Medicine

## 2024-02-11 ENCOUNTER — Telehealth: Payer: Self-pay

## 2024-02-11 DIAGNOSIS — K828 Other specified diseases of gallbladder: Secondary | ICD-10-CM

## 2024-02-11 DIAGNOSIS — R7989 Other specified abnormal findings of blood chemistry: Secondary | ICD-10-CM

## 2024-02-11 NOTE — Telephone Encounter (Signed)
 The pt has been advised and agrees to US . Order has been entered and sent to the schedulers

## 2024-02-11 NOTE — Telephone Encounter (Signed)
-----   Message from Nurse Anasha Perfecto P sent at 11/11/2023  4:25 PM EDT -----  repeat RUQ US  in 3 months due to finding of possible gallbladder sludge, follow up imaging recommended.

## 2024-02-17 ENCOUNTER — Ambulatory Visit

## 2024-02-17 DIAGNOSIS — Z23 Encounter for immunization: Secondary | ICD-10-CM

## 2024-02-20 ENCOUNTER — Ambulatory Visit (HOSPITAL_COMMUNITY)
Admission: RE | Admit: 2024-02-20 | Discharge: 2024-02-20 | Disposition: A | Source: Ambulatory Visit | Attending: Gastroenterology

## 2024-02-20 DIAGNOSIS — R7989 Other specified abnormal findings of blood chemistry: Secondary | ICD-10-CM | POA: Insufficient documentation

## 2024-02-20 DIAGNOSIS — K828 Other specified diseases of gallbladder: Secondary | ICD-10-CM | POA: Insufficient documentation

## 2024-02-24 ENCOUNTER — Ambulatory Visit: Payer: Self-pay | Admitting: General Surgery

## 2024-02-24 NOTE — Progress Notes (Signed)
 Surgery orders requested via Epic inbox.

## 2024-02-26 ENCOUNTER — Encounter (HOSPITAL_COMMUNITY): Payer: Self-pay

## 2024-02-26 NOTE — Progress Notes (Addendum)
 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 Patient agrees to go over the instructions while at home for a better understanding. Patient also instructed to self quarantine after being tested for COVID-19. The opportunity to ask questions was provided.

## 2024-02-26 NOTE — Patient Instructions (Signed)
 SURGICAL WAITING ROOM VISITATION  Patients having surgery or a procedure may have no more than 2 support people in the waiting area - these visitors may rotate.    Children ages 74 and under will not be able to visit patients in [facility] under most circumstances.   Visitors with respiratory illnesses are discouraged from visiting and should remain at home.  If the patient needs to stay at the hospital during part of their recovery, the visitor guidelines for inpatient rooms apply. Pre-op nurse will coordinate an appropriate time for 1 support person to accompany patient in pre-op.  This support person may not rotate.    Please refer to the [facility] website for the visitor guidelines for Inpatients (after your surgery is over and you are in a regular room).       Your procedure is scheduled on: 03-16-24   Report to [facility]    Report to admitting at     0515  AM   Call this number if you have problems the morning of surgery [phone]   Do not eat food :After Midnight.   After Midnight you may have the following liquids until _0430 _____ AM/ DAY OF SURGERY  then nothing by mouth  Water Non-Citrus Juices (without pulp, NO RED-Apple, White grape, White cranberry) Black Coffee (NO MILK/CREAM OR CREAMERS, sugar ok)  Clear Tea (NO MILK/CREAM OR CREAMERS, sugar ok) regular and decaf                             Plain Jell-O (NO RED)                                           Fruit ices (not with fruit pulp, NO RED)                                     Popsicles (NO RED)                                                               Sports drinks like Gatorade (NO RED)                      If you have questions, please contact your surgeons office.   FOLLOW ANY ADDITIONAL PRE OP INSTRUCTIONS YOU RECEIVED FROM YOUR SURGEON'S OFFICE!!!     Oral Hygiene is also important to reduce your risk of infection.                                     Remember - BRUSH YOUR TEETH THE MORNING OF SURGERY WITH YOUR REGULAR TOOTHPASTE  DENTURES WILL BE REMOVED PRIOR TO SURGERY PLEASE DO NOT APPLY Poly grip OR ADHESIVES!!!   Do NOT smoke after Midnight   Stop all vitamins and herbal supplements 7 days before surgery.   Take these medicines the morning of surgery with A SIP OF WATER: pantoprazole , metoprolol     Bring  CPAP mask and tubing day of surgery.                              You may not have any metal on your body including hair pins, jewelry, and body piercing             Do not wear , lotions, powders, /cologne, or deodorant               Men may shave face and neck.   Do not bring valuables to the hospital. [facility] IS NOT             RESPONSIBLE   FOR VALUABLES.   Contacts, glasses, dentures or bridgework may not be worn into surgery.   Bring small overnight bag day of surgery.   DO NOT BRING YOUR HOME MEDICATIONS TO THE HOSPITAL. PHARMACY WILL DISPENSE MEDICATIONS LISTED ON YOUR MEDICATION LIST TO YOU DURING YOUR ADMISSION IN THE HOSPITAL!    Patients discharged on the day of surgery will not be allowed to drive home.  Someone NEEDS to stay with you for the first 24 hours after anesthesia.   Special Instructions: Bring a copy of your healthcare power of attorney and living will documents the day of surgery if you haven't scanned them before.              Please read over the following fact sheets you were given: IF YOU HAVE QUESTIONS ABOUT YOUR PRE-OP INSTRUCTIONS PLEASE CALL [phone].   . If you test positive for Covid or have been in contact with anyone that has tested positive in the last 10 days please notify you surgeon.    [facility] - Preparing for Surgery Before surgery, you can play an important role.  Because skin is not sterile, your skin needs to be as free of germs as possible.  You can reduce the number of germs on your skin by washing with CHG (chlorahexidine gluconate) soap before surgery.  CHG  is an antiseptic cleaner which kills germs and bonds with the skin to continue killing germs even after washing. Please DO NOT use if you have an allergy to CHG or antibacterial soaps.  If your skin becomes reddened/irritated stop using the CHG and inform your nurse when you arrive at Short Stay. Do not shave (including legs and underarms) for at least 48 hours prior to the first CHG shower.  You may shave your face/neck.  Please follow these instructions carefully:  1.  Shower with CHG Soap the night before surgery ONLY (DO NOT USE THE SOAP THE MORNING OF SURGERY).  2.  If you choose to wash your hair, wash your hair first as usual with your normal  shampoo.  3.  After you shampoo, rinse your hair and body thoroughly to remove the shampoo.                             4.  Use CHG as you would any other liquid soap.  You can apply chg directly to the skin and wash.  Gently with a scrungie or clean washcloth.  5.  Apply the CHG Soap to your body ONLY FROM THE NECK DOWN.   Do  not use on face/ open  Wound or open sores. Avoid contact with eyes, ears mouth and genitals (private parts).                       Wash face,  Genitals (private parts) with your normal soap.             6.  Wash thoroughly, paying special attention to the area where your  surgery  will be performed.  7.  Thoroughly rinse your body with warm water from the neck down.  8.  DO NOT shower/wash with your normal soap after using and rinsing off the CHG Soap.                9.  Pat yourself dry with a clean towel.            10.  Wear clean pajamas.            11.  Place clean sheets on your bed the night of your first shower and do not  sleep with pets. Day of Surgery : Do not apply any CHG, lotions/deodorants the morning of surgery.  Please wear clean clothes to the hospital/surgery [facility].  FAILURE TO FOLLOW THESE INSTRUCTIONS MAY RESULT IN THE CANCELLATION OF YOUR SURGERY  PATIENT  SIGNATURE_________________________________  NURSE SIGNATURE__________________________________  ________________________________________________________________________

## 2024-02-27 ENCOUNTER — Ambulatory Visit: Payer: Self-pay | Admitting: Gastroenterology

## 2024-03-02 ENCOUNTER — Encounter (HOSPITAL_COMMUNITY): Admission: RE | Admit: 2024-03-02 | Discharge: 2024-03-02 | Attending: General Surgery

## 2024-03-02 ENCOUNTER — Other Ambulatory Visit: Payer: Self-pay

## 2024-03-02 ENCOUNTER — Encounter (HOSPITAL_COMMUNITY): Payer: Self-pay

## 2024-03-02 VITALS — BP 122/78 | HR 74 | Temp 99.1°F | Resp 16 | Ht 72.0 in | Wt 120.0 lb

## 2024-03-02 DIAGNOSIS — I10 Essential (primary) hypertension: Secondary | ICD-10-CM

## 2024-03-02 DIAGNOSIS — Z01812 Encounter for preprocedural laboratory examination: Secondary | ICD-10-CM | POA: Diagnosis present

## 2024-03-02 DIAGNOSIS — K429 Umbilical hernia without obstruction or gangrene: Secondary | ICD-10-CM | POA: Diagnosis not present

## 2024-03-02 DIAGNOSIS — Z87891 Personal history of nicotine dependence: Secondary | ICD-10-CM | POA: Diagnosis not present

## 2024-03-02 LAB — CBC
HCT: 40.8 % (ref 39.0–52.0)
Hemoglobin: 13.1 g/dL (ref 13.0–17.0)
MCH: 30.2 pg (ref 26.0–34.0)
MCHC: 32.1 g/dL (ref 30.0–36.0)
MCV: 94 fL (ref 80.0–100.0)
Platelets: 182 K/uL (ref 150–400)
RBC: 4.34 MIL/uL (ref 4.22–5.81)
RDW: 13.9 % (ref 11.5–15.5)
WBC: 6 K/uL (ref 4.0–10.5)
nRBC: 0 % (ref 0.0–0.2)

## 2024-03-02 LAB — BASIC METABOLIC PANEL WITH GFR
Anion gap: 11 (ref 5–15)
BUN: 9 mg/dL (ref 6–20)
CO2: 27 mmol/L (ref 22–32)
Calcium: 9.5 mg/dL (ref 8.9–10.3)
Chloride: 100 mmol/L (ref 98–111)
Creatinine, Ser: 0.92 mg/dL (ref 0.61–1.24)
GFR, Estimated: >60 mL/min (ref >60)
Glucose, Bld: 75 mg/dL (ref 70–99)
Potassium: 4.5 mmol/L (ref 3.5–5.1)
Sodium: 137 mmol/L (ref 135–145)

## 2024-03-04 ENCOUNTER — Encounter (HOSPITAL_COMMUNITY): Payer: Self-pay

## 2024-03-04 NOTE — Anesthesia Preprocedure Evaluation (Addendum)
"                                    Anesthesia Evaluation  Patient identified by MRN, date of birth, ID band Patient awake    Reviewed: Allergy & Precautions, NPO status , Patient's Chart, lab work & pertinent test results, reviewed documented beta blocker date and time   History of Anesthesia Complications Negative for: history of anesthetic complications  Airway Mallampati: I  TM Distance: >3 FB Neck ROM: Full    Dental  (+) Dental Advisory Given   Pulmonary COPD, Current Smoker and Patient abstained from smoking.   breath sounds clear to auscultation       Cardiovascular hypertension, Pt. on medications and Pt. on home beta blockers (-) angina  Rhythm:Regular Rate:Normal  08/2023 ECHO: EF 60-65% 1. LV has normal function 2. RVF normal 3. Trivial MR 4. Unable to visualize AV well, but no AI, no AS   Neuro/Psych    GI/Hepatic ,GERD  Medicated and Controlled,,(+) Cirrhosis   ascites  substance abuse  alcohol use and marijuana use  Endo/Other  negative endocrine ROS    Renal/GU negative Renal ROS     Musculoskeletal   Abdominal   Peds  Hematology Hb 13.1, plt 182k   Anesthesia Other Findings   Reproductive/Obstetrics                              Anesthesia Physical Anesthesia Plan  ASA: 3  Anesthesia Plan: General   Post-op Pain Management: Precedex    Induction: Intravenous  PONV Risk Score and Plan: 1 and Ondansetron  and Dexamethasone   Airway Management Planned: Oral ETT  Additional Equipment: None  Intra-op Plan:   Post-operative Plan: Extubation in OR  Informed Consent: I have reviewed the patients History and Physical, chart, labs and discussed the procedure including the risks, benefits and alternatives for the proposed anesthesia with the patient or authorized representative who has indicated his/her understanding and acceptance.     Dental advisory given  Plan Discussed with: CRNA and  Surgeon  Anesthesia Plan Comments: (See PAT note from 12/15 )         Anesthesia Quick Evaluation  "

## 2024-03-04 NOTE — Progress Notes (Signed)
 Case: [id number] Date/Time: 03/16/24 0715   Procedure: REPAIR, HERNIA, VENTRAL, ROBOT-ASSISTED - ROBOTIC VENTRAL HERNIA REPAIR WITH MESH   Anesthesia type: General   Pre-op diagnosis: UMBILICAL HERNIA   Location: [facility] ROOM 02 / [facility]   Surgeons: Polly Cordella LABOR, MD       DISCUSSION: [facility] is a 58 yo male with PMH of smoking (cigarettes and marijuana), hx of ETOH abuse, HTN, hx of cervical myelopathy, GIB, hx of ascites.  Admitted for upper GIB in 12/2022. Underwent EGD without findings.  Patient has a history of ascites of unclear etiology. He had a CT from January that showed a relatively large volume of ascites. He was scheduled for paracentesis in May but at the time of evaluation there was minimal fluid. He denies any issues since. He has never had a paracentesis and never had SBP. Follows with GI. PETH level >2000. INR 1.2. They advised ETOH cessation.   Echo in 08/2023 normal  At PAT visit patient reports he is able to do stairs without CP/SOB    VS: BP 122/78   Pulse 74   Temp 37.3 C (Oral)   Resp 16   Ht 6' (1.829 m)   Wt 54.4 kg   SpO2 100%   BMI 16.27 kg/m   PROVIDERS: [name], [name], [name]   LABS: Labs reviewed: Acceptable for surgery. (all labs ordered are listed, but only abnormal results are displayed)  Labs Reviewed  BASIC METABOLIC PANEL WITH GFR  CBC     IMAGES:   EKG 12/10/2023:  Sinus bradycardia Left anterior fascicular block   Echo 09/12/2023:  IMPRESSIONS    1. Left ventricular ejection fraction, by estimation, is 60 to 65%. Left ventricular ejection fraction by 3D volume is 62 %. The left ventricle has normal function. The left ventricle has no regional wall motion abnormalities. Left ventricular diastolic  parameters were normal. The average left ventricular global longitudinal strain is -14.6 %. The global longitudinal strain is abnormal.  2. Right ventricular systolic function is normal. The right ventricular size is  normal. There is normal pulmonary artery systolic pressure. The estimated right ventricular systolic pressure is 10.2 mmHg.  3. The mitral valve is grossly normal. Trivial mitral valve regurgitation. No evidence of mitral stenosis.  4. The aortic valve was not well visualized. Unable to determine aortic valve morphology due to image quality. Aortic valve regurgitation is not visualized. No aortic stenosis is present.  5. The inferior vena cava is normal in size with greater than 50% respiratory variability, suggesting right atrial pressure of 3 mmHg.  Comparison(s): No prior Echocardiogram. Past Medical History:  Diagnosis Date   Cervical myelopathy (HCC) 11/01/2016   pt unaware   Gait abnormality 11/01/2016   Hypertension     Past Surgical History:  Procedure Laterality Date   ANTERIOR CERVICAL CORPECTOMY N/A 03/26/2016   Procedure: Cervical  Seven Anterior cervical corpectomy;  Surgeon: Morene Hicks Ditty, MD;  Location: [facility] OR;  Service: Neurosurgery;  Laterality: N/A;  Cervical  Seven Anterior cervical corpectomy   ESOPHAGOGASTRODUODENOSCOPY (EGD) WITH PROPOFOL  N/A 12/26/2022   Procedure: ESOPHAGOGASTRODUODENOSCOPY (EGD) WITH PROPOFOL ;  Surgeon: Legrand Victory LITTIE DOUGLAS, MD;  Location: [facility] ENDOSCOPY;  Service: Gastroenterology;  Laterality: N/A;   tooth pulled      MEDICATIONS:  B Complex Vitamins (B COMPLEX PO)   Cyanocobalamin  (B-12 PO)   folic acid  (FOLVITE ) 1 MG tablet   metoprolol  tartrate (LOPRESSOR ) 25 MG tablet   pantoprazole  (PROTONIX ) 40 MG tablet   potassium  chloride (KLOR-CON ) 10 MEQ tablet   thiamine  (VITAMIN B1) 100 MG tablet   No current facility-administered medications for this encounter.    Burnard CHRISTELLA Odis DEVONNA [facility] Short Stay/Anesthesiology [facility] Phone [phone] 03/04/2024 3:01 PM

## 2024-03-16 ENCOUNTER — Other Ambulatory Visit (HOSPITAL_COMMUNITY): Payer: Self-pay

## 2024-03-16 ENCOUNTER — Ambulatory Visit (HOSPITAL_COMMUNITY): Admitting: Anesthesiology

## 2024-03-16 ENCOUNTER — Encounter (HOSPITAL_COMMUNITY)
Admission: RE | Disposition: A | Discharge status: Home / Self Care | Payer: Self-pay | Source: Ambulatory Visit | Attending: General Surgery

## 2024-03-16 ENCOUNTER — Other Ambulatory Visit: Payer: Self-pay

## 2024-03-16 ENCOUNTER — Ambulatory Visit (HOSPITAL_COMMUNITY)
Admission: RE | Admit: 2024-03-16 | Discharge: 2024-03-16 | Disposition: A | Discharge status: Home / Self Care | Source: Ambulatory Visit | Attending: General Surgery | Admitting: General Surgery

## 2024-03-16 ENCOUNTER — Encounter (HOSPITAL_COMMUNITY): Payer: Self-pay | Admitting: General Surgery

## 2024-03-16 ENCOUNTER — Ambulatory Visit (HOSPITAL_COMMUNITY): Payer: Self-pay | Admitting: Medical

## 2024-03-16 DIAGNOSIS — I10 Essential (primary) hypertension: Secondary | ICD-10-CM | POA: Diagnosis not present

## 2024-03-16 DIAGNOSIS — K439 Ventral hernia without obstruction or gangrene: Secondary | ICD-10-CM | POA: Insufficient documentation

## 2024-03-16 DIAGNOSIS — J449 Chronic obstructive pulmonary disease, unspecified: Secondary | ICD-10-CM | POA: Diagnosis not present

## 2024-03-16 DIAGNOSIS — Z79899 Other long term (current) drug therapy: Secondary | ICD-10-CM | POA: Insufficient documentation

## 2024-03-16 DIAGNOSIS — F17210 Nicotine dependence, cigarettes, uncomplicated: Secondary | ICD-10-CM | POA: Diagnosis not present

## 2024-03-16 DIAGNOSIS — K219 Gastro-esophageal reflux disease without esophagitis: Secondary | ICD-10-CM | POA: Diagnosis not present

## 2024-03-16 DIAGNOSIS — K429 Umbilical hernia without obstruction or gangrene: Secondary | ICD-10-CM | POA: Insufficient documentation

## 2024-03-16 HISTORY — PX: XI ROBOTIC ASSISTED VENTRAL HERNIA: SHX6789

## 2024-03-16 SURGERY — REPAIR, HERNIA, VENTRAL, ROBOT-ASSISTED
Anesthesia: General | Site: Abdomen

## 2024-03-16 MED ORDER — LACTATED RINGERS IV SOLN: INTRAVENOUS | Status: DC | PRN

## 2024-03-16 MED ORDER — CELECOXIB 200 MG PO CAPS
200.0000 mg | ORAL_CAPSULE | ORAL | Status: AC
  Administered 2024-03-16: 200 mg via ORAL
  Filled 2024-03-16: qty 1

## 2024-03-16 MED ORDER — SUGAMMADEX SODIUM 200 MG/2ML IV SOLN
INTRAVENOUS | Status: AC
  Filled 2024-03-16: qty 2

## 2024-03-16 MED ORDER — PROPOFOL 10 MG/ML IV BOLUS
INTRAVENOUS | Status: DC | PRN
  Administered 2024-03-16: 200 mg via INTRAVENOUS

## 2024-03-16 MED ORDER — LACTATED RINGERS IV SOLN: INTRAVENOUS | Status: DC

## 2024-03-16 MED ORDER — MIDAZOLAM HCL 5 MG/5ML IJ SOLN
INTRAMUSCULAR | Status: DC | PRN
  Administered 2024-03-16: 2 mg via INTRAVENOUS

## 2024-03-16 MED ORDER — HYDROMORPHONE HCL 1 MG/ML IJ SOLN
INTRAMUSCULAR | Status: AC
  Filled 2024-03-16: qty 1

## 2024-03-16 MED ORDER — GLYCOPYRROLATE 0.2 MG/ML IJ SOLN
INTRAMUSCULAR | Status: AC
  Filled 2024-03-16: qty 1

## 2024-03-16 MED ORDER — FENTANYL CITRATE (PF) 100 MCG/2ML IJ SOLN
INTRAMUSCULAR | Status: DC | PRN
  Administered 2024-03-16: 100 ug via INTRAVENOUS
  Administered 2024-03-16: 50 ug via INTRAVENOUS

## 2024-03-16 MED ORDER — EPHEDRINE SULFATE-NACL 50-0.9 MG/10ML-% IV SOSY
PREFILLED_SYRINGE | INTRAVENOUS | Status: DC | PRN
  Administered 2024-03-16 (×2): 5 mg via INTRAVENOUS
  Administered 2024-03-16: 10 mg via INTRAVENOUS

## 2024-03-16 MED ORDER — HYDROMORPHONE HCL 1 MG/ML IJ SOLN
0.2500 mg | INTRAMUSCULAR | Status: DC | PRN
  Administered 2024-03-16 (×4): 0.5 mg via INTRAVENOUS

## 2024-03-16 MED ORDER — ROCURONIUM BROMIDE 100 MG/10ML IV SOLN
INTRAVENOUS | Status: DC | PRN
  Administered 2024-03-16: 10 mg via INTRAVENOUS
  Administered 2024-03-16: 60 mg via INTRAVENOUS

## 2024-03-16 MED ORDER — SODIUM CHLORIDE 0.9 % IR SOLN
Status: DC | PRN
  Administered 2024-03-16: 1000 mL

## 2024-03-16 MED ORDER — MIDAZOLAM HCL (PF) 2 MG/2ML IJ SOLN: 0.5000 mg | Freq: Once | INTRAMUSCULAR | Status: DC | PRN

## 2024-03-16 MED ORDER — PHENYLEPHRINE 80 MCG/ML (10ML) SYRINGE FOR IV PUSH (FOR BLOOD PRESSURE SUPPORT)
PREFILLED_SYRINGE | INTRAVENOUS | Status: DC | PRN
  Administered 2024-03-16 (×2): 160 ug via INTRAVENOUS

## 2024-03-16 MED ORDER — SODIUM CHLORIDE 0.9% FLUSH: 3.0000 mL | Freq: Two times a day (BID) | INTRAVENOUS | Status: DC

## 2024-03-16 MED ORDER — FENTANYL CITRATE (PF) 100 MCG/2ML IJ SOLN
INTRAMUSCULAR | Status: AC
  Filled 2024-03-16: qty 2

## 2024-03-16 MED ORDER — OXYCODONE HCL 5 MG PO TABS: 5.0000 mg | ORAL_TABLET | ORAL | Status: DC | PRN

## 2024-03-16 MED ORDER — SODIUM CHLORIDE 0.9% FLUSH: 3.0000 mL | INTRAVENOUS | Status: DC | PRN

## 2024-03-16 MED ORDER — ONDANSETRON HCL 4 MG/2ML IJ SOLN
INTRAMUSCULAR | Status: DC | PRN
  Administered 2024-03-16: 4 mg via INTRAVENOUS

## 2024-03-16 MED ORDER — ACETAMINOPHEN 650 MG RE SUPP: 650.0000 mg | RECTAL | Status: DC | PRN

## 2024-03-16 MED ORDER — MEPERIDINE HCL 25 MG/ML IJ SOLN: 6.2500 mg | INTRAMUSCULAR | Status: DC | PRN

## 2024-03-16 MED ORDER — BUPIVACAINE-EPINEPHRINE 0.25% -1:200000 IJ SOLN
INTRAMUSCULAR | Status: DC | PRN
  Administered 2024-03-16: 20 mL
  Administered 2024-03-16: 10 mL

## 2024-03-16 MED ORDER — ROCURONIUM BROMIDE 10 MG/ML (PF) SYRINGE
PREFILLED_SYRINGE | INTRAVENOUS | Status: AC
  Filled 2024-03-16: qty 10

## 2024-03-16 MED ORDER — GLYCOPYRROLATE 0.2 MG/ML IJ SOLN
INTRAMUSCULAR | Status: DC | PRN
  Administered 2024-03-16: .2 mg via INTRAVENOUS

## 2024-03-16 MED ORDER — ACETAMINOPHEN 500 MG PO TABS
1000.0000 mg | ORAL_TABLET | ORAL | Status: AC
  Administered 2024-03-16: 1000 mg via ORAL
  Filled 2024-03-16: qty 2

## 2024-03-16 MED ORDER — PROPOFOL 10 MG/ML IV BOLUS
INTRAVENOUS | Status: AC
  Filled 2024-03-16: qty 20

## 2024-03-16 MED ORDER — MIDAZOLAM HCL 2 MG/2ML IJ SOLN
INTRAMUSCULAR | Status: AC
  Filled 2024-03-16: qty 2

## 2024-03-16 MED ORDER — SODIUM CHLORIDE 0.9 % IV SOLN: 250.0000 mL | INTRAVENOUS | Status: DC | PRN

## 2024-03-16 MED ORDER — BUPIVACAINE-EPINEPHRINE (PF) 0.25% -1:200000 IJ SOLN
INTRAMUSCULAR | Status: AC
  Filled 2024-03-16: qty 60

## 2024-03-16 MED ORDER — CHLORHEXIDINE GLUCONATE CLOTH 2 % EX PADS: 6.0000 | MEDICATED_PAD | Freq: Once | CUTANEOUS | Status: DC

## 2024-03-16 MED ORDER — EPHEDRINE 5 MG/ML INJ
INTRAVENOUS | Status: AC
  Filled 2024-03-16: qty 5

## 2024-03-16 MED ORDER — ORAL CARE MOUTH RINSE: 15.0000 mL | Freq: Once | OROMUCOSAL | Status: AC

## 2024-03-16 MED ORDER — LIDOCAINE HCL (PF) 2 % IJ SOLN
INTRAMUSCULAR | Status: AC
  Filled 2024-03-16: qty 5

## 2024-03-16 MED ORDER — ONDANSETRON HCL 4 MG/2ML IJ SOLN
INTRAMUSCULAR | Status: AC
  Filled 2024-03-16: qty 2

## 2024-03-16 MED ORDER — DEXAMETHASONE SOD PHOSPHATE PF 10 MG/ML IJ SOLN
INTRAMUSCULAR | Status: DC | PRN
  Administered 2024-03-16: 10 mg via INTRAVENOUS

## 2024-03-16 MED ORDER — CEFAZOLIN SODIUM-DEXTROSE 2-4 GM/100ML-% IV SOLN
2.0000 g | INTRAVENOUS | Status: AC
  Administered 2024-03-16: 2 g via INTRAVENOUS
  Filled 2024-03-16: qty 100

## 2024-03-16 MED ORDER — ACETAMINOPHEN 325 MG PO TABS: 650.0000 mg | ORAL_TABLET | ORAL | Status: DC | PRN

## 2024-03-16 MED ORDER — SUGAMMADEX SODIUM 200 MG/2ML IV SOLN
INTRAVENOUS | Status: DC | PRN
  Administered 2024-03-16: 100 mg via INTRAVENOUS

## 2024-03-16 MED ORDER — LIDOCAINE HCL (CARDIAC) PF 100 MG/5ML IV SOSY
PREFILLED_SYRINGE | INTRAVENOUS | Status: DC | PRN
  Administered 2024-03-16: 30 mg via INTRAVENOUS

## 2024-03-16 MED ORDER — GABAPENTIN 300 MG PO CAPS
300.0000 mg | ORAL_CAPSULE | ORAL | Status: AC
  Administered 2024-03-16: 300 mg via ORAL
  Filled 2024-03-16: qty 1

## 2024-03-16 MED ORDER — ACETAMINOPHEN 325 MG PO TABS
650.0000 mg | ORAL_TABLET | Freq: Four times a day (QID) | ORAL | 0 refills | Status: AC
  Filled 2024-03-16: qty 50, 7d supply, fill #0

## 2024-03-16 MED ORDER — DEXMEDETOMIDINE HCL IN NACL 80 MCG/20ML IV SOLN
INTRAVENOUS | Status: DC | PRN
  Administered 2024-03-16: 8 ug via INTRAVENOUS
  Administered 2024-03-16: 4 ug via INTRAVENOUS

## 2024-03-16 MED ORDER — IBUPROFEN 200 MG PO TABS
600.0000 mg | ORAL_TABLET | Freq: Four times a day (QID) | ORAL | 0 refills | Status: DC
  Filled 2024-03-16: qty 100, 9d supply, fill #0

## 2024-03-16 MED ORDER — OXYCODONE HCL 5 MG/5ML PO SOLN: 5.0000 mg | Freq: Once | ORAL | Status: DC | PRN

## 2024-03-16 MED ORDER — DEXMEDETOMIDINE HCL IN NACL 80 MCG/20ML IV SOLN
INTRAVENOUS | Status: AC
  Filled 2024-03-16: qty 20

## 2024-03-16 MED ORDER — MORPHINE SULFATE (PF) 2 MG/ML IV SOLN: 1.0000 mg | INTRAVENOUS | Status: DC | PRN

## 2024-03-16 MED ORDER — OXYCODONE HCL 5 MG PO TABS
5.0000 mg | ORAL_TABLET | Freq: Three times a day (TID) | ORAL | 0 refills | Status: AC | PRN
  Filled 2024-03-16: qty 12, 4d supply, fill #0

## 2024-03-16 MED ORDER — OXYCODONE HCL 5 MG PO TABS: 5.0000 mg | ORAL_TABLET | Freq: Once | ORAL | Status: DC | PRN

## 2024-03-16 MED ORDER — CHLORHEXIDINE GLUCONATE 0.12 % MT SOLN
15.0000 mL | Freq: Once | OROMUCOSAL | Status: AC
  Administered 2024-03-16: 15 mL via OROMUCOSAL

## 2024-03-16 SURGICAL SUPPLY — 40 items
APPLICATOR COTTON TIP 6 STRL (MISCELLANEOUS) IMPLANT
BAG COUNTER SPONGE SURGICOUNT (BAG) IMPLANT
BLADE SURG SZ11 CARB STEEL (BLADE) ×1 IMPLANT
CHLORAPREP W/TINT 26 (MISCELLANEOUS) ×1 IMPLANT
COVER SURGICAL LIGHT HANDLE (MISCELLANEOUS) ×1 IMPLANT
COVER TIP SHEARS 8 DVNC (MISCELLANEOUS) ×1 IMPLANT
DERMABOND ADVANCED .7 DNX12 (GAUZE/BANDAGES/DRESSINGS) ×1 IMPLANT
DRAPE ARM DVNC X/XI (DISPOSABLE) ×4 IMPLANT
DRAPE COLUMN DVNC XI (DISPOSABLE) ×1 IMPLANT
DRIVER NDLE MEGA SUTCUT DVNCXI (INSTRUMENTS) ×1 IMPLANT
ELECT PENCIL ROCKER SW 15FT (MISCELLANEOUS) ×1 IMPLANT
ELECT REM PT RETURN 15FT ADLT (MISCELLANEOUS) ×1 IMPLANT
GAUZE 4X4 16PLY ~~LOC~~+RFID DBL (SPONGE) ×1 IMPLANT
GLOVE BIO SURGEON STRL SZ7.5 (GLOVE) ×2 IMPLANT
GOWN STRL REUS W/ TWL XL LVL3 (GOWN DISPOSABLE) ×2 IMPLANT
IRRIGATION SUCT STRKRFLW 2 WTP (MISCELLANEOUS) IMPLANT
KIT BASIN OR (CUSTOM PROCEDURE TRAY) ×1 IMPLANT
KIT TURNOVER KIT A (KITS) ×1 IMPLANT
MARKER SKIN DUAL TIP RULER LAB (MISCELLANEOUS) ×1 IMPLANT
MESH PROGRIP LAP SLF FIX 16X12 (Mesh General) IMPLANT
NEEDLE HYPO 22X1.5 SAFETY MO (MISCELLANEOUS) ×1 IMPLANT
NEEDLE INSUFFLATION 14GA 120MM (NEEDLE) ×1 IMPLANT
OBTURATOR OPTICALSTD 8 DVNC (TROCAR) ×1 IMPLANT
PACK CARDIOVASCULAR III (CUSTOM PROCEDURE TRAY) ×1 IMPLANT
PROTECTOR NERVE ULNAR (MISCELLANEOUS) IMPLANT
SCISSORS MNPLR CVD DVNC XI (INSTRUMENTS) ×1 IMPLANT
SEAL UNIV 5-12 XI (MISCELLANEOUS) ×4 IMPLANT
SOLUTION ANTFG W/FOAM PAD STRL (MISCELLANEOUS) ×1 IMPLANT
SOLUTION ELECTROSURG ANTI STCK (MISCELLANEOUS) ×1 IMPLANT
SPIKE FLUID TRANSFER (MISCELLANEOUS) ×1 IMPLANT
SUT MNCRL AB 4-0 PS2 18 (SUTURE) ×1 IMPLANT
SUT STRATA PDS 2-0 23 CT-1 (SUTURE) IMPLANT
SUT STRATAFIX SPIRAL PDS3-0 (SUTURE) IMPLANT
SUTURE STRATFX 0 PDS+ CT-2 23 (SUTURE) ×2 IMPLANT
SYR 10ML LL (SYRINGE) ×1 IMPLANT
SYR 20ML LL LF (SYRINGE) ×1 IMPLANT
TOWEL GREEN STERILE FF (TOWEL DISPOSABLE) ×1 IMPLANT
TOWEL OR 17X26 10 PK STRL BLUE (TOWEL DISPOSABLE) ×1 IMPLANT
TROCAR Z-THREAD FIOS 5X100MM (TROCAR) ×1 IMPLANT
TUBING INSUFFLATION 10FT LAP (TUBING) ×1 IMPLANT

## 2024-03-16 NOTE — Anesthesia Procedure Notes (Signed)
 Procedure Name: Intubation Date/Time: 03/16/2024 7:38 AM  Performed by: Areatha Glendia HERO, CRNAPre-anesthesia Checklist: Patient identified, Emergency Drugs available, Suction available and Patient being monitored Patient Re-evaluated:Patient Re-evaluated prior to induction Oxygen Delivery Method: Circle System Utilized Preoxygenation: Pre-oxygenation with 100% oxygen Induction Type: IV induction Ventilation: Mask ventilation without difficulty Laryngoscope Size: Mac and 4 Grade View: Grade I Tube type: Oral Tube size: 7.5 mm Number of attempts: 1 Airway Equipment and Method: Stylet and Oral airway Placement Confirmation: ETT inserted through vocal cords under direct vision, positive ETCO2 and breath sounds checked- equal and bilateral Secured at: 22 cm Tube secured with: Tape Dental Injury: Teeth and Oropharynx as per pre-operative assessment

## 2024-03-16 NOTE — Transfer of Care (Addendum)
 Immediate Anesthesia Transfer of Care Note  Patient: [facility]  Procedure(s) Performed: REPAIR, HERNIA, VENTRAL, ROBOT-ASSISTED (Abdomen)  Patient Location: PACU  Anesthesia Type:General  Level of Consciousness: awake  Airway & Oxygen Therapy: Patient Spontanous Breathing and Patient connected to face mask oxygen  Post-op Assessment: Report given to RN and Post -op Vital signs reviewed and stable  Post vital signs: Reviewed and stable  Last Vitals:  Vitals Value Taken Time  BP 100/65 03/16/24 09:53  Temp 36 C 03/16/24 09:53  Pulse 55 03/16/24 09:54  Resp 12 03/16/24 09:54  SpO2 100 % 03/16/24 09:54  Vitals shown include unfiled device data.  Last Pain:  Vitals:   03/16/24 0603  TempSrc: Oral  PainSc:          Complications: No notable events documented.

## 2024-03-16 NOTE — Op Note (Signed)
 Robert Carney ([id number])  Operative Report  Date 03/16/2024   PREOPERATIVE DIAGNOSIS: Umbilical hernia  POSTOPERATIVE DIAGNOSIS: Same (2.5cm x 2cm based on intra-op measurement)  PROCEDURE:  Robotic Transabdominal Preperitoneal Ventral Hernia Repair with Mesh (8x10cm Progrip Mesh)   HERNIA CHARACTERISTICS: Location: umbilical Approach: Robotic Initial/Recurrent: Initial Reducible/Incarcerated: easily reducible Mesh Implantation: Yes - 8x10 cm Medtronic ProGrip Mesh Hernia Size: 2.5 x 2cm  IMPLANTS: Implant Name Type Inv. Item Serial No. Manufacturer Lot No. LRB No. Used Action  MESH PROGRIP LAP SLF FIX 16X12 - [id number] Mesh General MESH PROGRIP LAP SLF FIX 16X12  COVIDIEN [id number] N/A 1 Implanted    SURGEON: Cordella Idler, MD  ANESTHESIA: General Anesthesia    INTRAOPERATIVE FINDINGS: Fat-containing umbilical hernia. Evidence of cirrhosis  ESTIMATED BLOOD LOSS: Minimal  COMPLICATIONS: None  SPECIMENS: None  OPERATIVE INDICATIONS: Pt is a 58 y.o. male who presents with a umbilical ventral hernia.  The patient desires definitive repair.  The risks, benefits and alternatives of the procedure were explained to the patient.  Risks including the risks of bleeding, infection, need for mesh removal, and potential of hernia recurrence were discussed.  The patient agreed to proceed and signed informed consent in front of a witness.   DESCRIPTION OF PROCEDURE: After preoperative identifying the patient in holding, the patient was brought to the operating room and placed supine on the operating table. SCDs were placed.  After induction of general anesthesia, the patient was given the appropriate perioperative antibiotics.  Both arms were tucked and padded to avoid potential nerve injury.  The abdomen was prepped and draped in a typical sterile fashion.  A JACHO approved time out, where the name of the patient, the operation, and intended site was confirmed.  The procedure was  begun.  An incision was made in the left upper quadrant and the Veress needle was introduced into the abdomen under standard drop technique.  The abdomen was insufflated with air.  An 8 mm left upper quadrant optical trocar was placed under direct visualization and the abdomen was inspected.  No evidence of injury was evident at the trocar site or secondary to the Veress needle.  Two additional 8 mm ports were placed in the left flank and epigastric region under direct visualization.  The daVinci robot was brought into the field and docked. The hernia was inspected.  The hernia was noted to contain fat.I also looked at his liver and noted signs of cirrhosis.  The peritoneum at the falciform ligament was opened with sharp dissection and electrocautery.  The space was developed to accommodate reduction of the hernia as well as adequate space to place our mesh.  Attention was turned to the hernia.  Adhesions to the hernia were taken down sharply and with electrocautery.  Care was taken to not injury any of the abdominal contents of the hernia.  After defining our defect, the defect was closed primarily with a running #1 Stratafix Symmetric Suture.  Next, a 10 cm x 8 cm piece of Medtronic ProGrip mesh was introduced in the abdomen.  The mesh was aligned appropriately with the defect to provide adequate coverage in all directions.  There was good coverage of the defect at completion.  Attention was then turned to closure of the peritoneal flap.  This was closed with a running 3-0 Stratafix Spiral Suture.  The abdomen was thoroughly inspected a final time and hemostasis was assured.  Any suture needles were removed from the body.  The robot was undocked from the  patient.  The remaining ports were removed and the abdomen was desulfated.  The port sites were closed, local anesthetic was infiltrated, and Dermabond applied.  After confirming twice that the sponge, needle, and instrument counts were correct, the procedure was  terminated, the patient was extubated and transferred to the recovery room in stable condition.  DISPOSITION: Stable to PACU.   Electronically Signed By: Cordella DELENA Idler 03/16/2024 9:43 AM

## 2024-03-16 NOTE — Discharge Instructions (Signed)
 Home Care After Hernia Repair  Activity  Limit activity for the first 24 hours, then you may return to normal daily activities. Returning to normal daily activities as soon as you can following surgery will enhance recovery time.  No heavy lifting pushing or pulling, anything heavier than 10 pounds (gallon of milk weighs approx. 8.8 pounds) for 4-6 weeks from surgery date.  Do not mow the lawn, use a vacuum cleaner, or do any other strenuous activities without first consulting your surgical team.  Climb stairs slowly and watch your step.  Walk as often as you feel able to increase strength and endurance.  No driving or operating heavy machinery within 24 hours of taking narcotic pain medication.  Diet  Drink plenty of fluids and eat a light meal on the night of surgery. Some patients may find their appetite is poor for a week or two after surgery. This is a normal result of the stress of surgery-your appetite will return in time.   There are no specific diet restrictions after surgery.  Dressings and Wound Care  Dermabond/Durabond (skin glue): This will usually remain in place for 10-14 days, then naturally fall off your skin. You may take a shower 24 hrs after  surgery, carefully wash, not scrub the incision site with a mild non-scented soap. Pat dry with a soft towel.  Do not pick or peel skin glue off.  You can shower and let the water fall on the dressings above. Do not soak or submerge your incision(s) in a bath tub, hot tub, or swimming pool, until your doctor says it is ok to do so or the incision(s) have completely healed, usually about 2-4 weeks.  Do not use creams, powder, salves or balms on your incision(s).  What to Expect After Surgery   Moderate discomfort controlled with medications  Minimal drainage from incision  You may feel pain in one or both shoulders. This pain comes from the gas still left in your belly after the surgery, if you had laparoscopic surgery (several small  incisions). The pain should ease over several days to a week. Ambulation will help with this pain.   Belly swelling  Feeling fatigue and weak  Constipation after surgery is common. Drink plenty fluids and eat a high fiber diet.  Swelling - In some patients might feel that their hernia has returned after surgery-DO NOT Worry this is normal. Swelling may be due to the development of a seroma. Seroma is fluid that has built up where the hernia was repaired this is a normal result after surgery and it will slowly reabsorb back into your body over the next several weeks.   Pain Control: Prescribed Non-Narcotic Pain Medication  You will be given three prescriptions.  Two of them will be for prescription strength ibuprofen (i.e. Advil) and prescription strength acetaminophen (i.e. Tylenol).  The vast majority of patients will just need these two medications.  One prescription will be for a 'rescue' prescription of an oral narcotic (oxycodone).  You may fill this if needed.  You will alternate taking the ibuprofen (600mg ) every 6 hours and also the acetaminophen (650mg ) every 6 hours so that you are taking one of those medications every 3 hours.  For example: o 0800 - take ibuprofen 600mg  o 1100 - take acetaminophen 650mg  o 1400 - take ibuprofen 600mg  o 1700 - take acetaminophen 650mg  o Etc.  Continue taking this alternating pattern of ibuprofen and acetaminophen for 3 days  If you cannot take one  or the other of these medications, just take the one you can every 6 hours.  If you are comfortable at night, you don't have to wake up and take a medication.  If you are still uncomfortable after taking either ibuprofen or acetaminophen, try gentle stretching exercise and ice packs (a bag of frozen vegetables works great).  If you are still uncomfortable, you may fill the narcotic prescription of Oxycodone and take as directed.  Once you have completed these prescriptions, your pain level should be low enough  to stop taking medications altogether or just use an over the counter medication (ibuprofen or acetaminophen) as needed.   Pain Control: Over the Counter Medications to take as needed:  Colace/Docusate: May be prescribed by your surgeon to prevent constipation caused by the combination of narcotics, effects of anesthesia, and decreased ambulation.  Hold for loose stools or diarrhea. Take 100 mg 1-2 times a day starting tonight.   Fiber: High fiber foods, extra liquids (water 9-13 cups/day) can also assist with constipation. Examples of high fiber foods are fruit, bran. Prune juice and water are also good liquids to drink.  Milk of Magnesia/Miralax:  If constipated despite take the over the counter stool softeners, you may take Milk of Magnesia or Miralax as directed on bottle to assist with constipation.     Pepcid/Famotidine: May be prescribed while taking naproxen (Aleve) or other NSAIDs such as ibuprofen (Motrin/Advil) to prevent stomach upset or Acid-reflux symptoms. Take 1 tablet 1-2 times a day.   **Constipation: The first bowel movement may occur anywhere between 1-5 days after surgery.  As long as you are not nauseated or not having significant abdominal pain this variation is acceptable.  Narcotic pain medications can cause constipation increasing discomfort; early discontinuation will assist with bowel management.If constipated despite taking stool softeners, you may take Milk of Magnesia or Miralax as directed on the bottle.     **Home medications: You may restart your home medications as directed by your respective Primary Care Physician or Surgeon.  When to notify your Doctor or Healthcare Team:   Sign of Wound Infection   Fever over 100 degrees.  Wound becomes extremely swollen, shows red streaks, warm to the touch, and/or drainage from the incision site or foul-smelling drainage.  Wound edges separate or opens up  Bleeding or bruising   If you have bleeding, apply pressure to the  site and hold the pressure firmly for 5 minutes. If the bleeding continues, apply pressure again and call 911. If the bleeding stopped, call your doctor to report it.   Call your doctor or nurse if you have increased bleeding from your site and increased bruising or a lump forms or gets larger under your skin at the site.  Unrelieved Pain    Call your doctor or nurse if your pain gets worse or is not eased 1 hour after taking your pain medicine, or if it is severe and uncontrolled.  Nausea and Vomiting   Call your doctor or nurse if you have nausea and vomiting that continues more than 24 hours, will not let you keep medicine down and will not let you keep fluids down  Fever, Flu-like symptoms   Fever over 100 degrees and/or chills  Gastrointestinal Bleeding Symptoms    Black tarry bowel movements.  This can be normal after surgery on the stomach, but should resolve in a day or two.    Call 911 if you suddenly have signs of blood loss such as:  Vomiting blood  Fast heart rate  Feeling faint, sweaty, or blacking out  Passing bright red blood from your rectum  Blood Clot Symptoms   Tender, swollen or reddened areas in your calf muscle or thighs.  Numbness or tingling in your lower leg or calf, or at the top of your leg or groin  Skin on your leg looks pale or blue or feels cold to touch  Chest pain or have trouble breathing, lightheadedness, fast heart rate  Sudden Onset of Symptoms    Call 911 if you suddenly have:  Leg weakness and spasm  Loss of bladder or bowel function  Seizure  Confusion, severe headache, dizziness or feeling unsteady, problems talking, difficulty swallowing, and/or numbness or muscle weakness as these could be signs of a stroke.   Follow up Appointment Your follow up appointment should be scheduled 2-3 weeks after your surgery date.  If you have not previously scheduled for a follow-up visit you can be scheduled by contacting [phone].

## 2024-03-16 NOTE — Anesthesia Postprocedure Evaluation (Signed)
"   Anesthesia Post Note  Patient: Robert Carney  Procedure(s) Performed: REPAIR, HERNIA, VENTRAL, ROBOT-ASSISTED (Abdomen)     Patient location during evaluation: PACU Anesthesia Type: General Level of consciousness: sedated, oriented and patient cooperative Pain management: pain level controlled Vital Signs Assessment: post-procedure vital signs reviewed and stable Respiratory status: spontaneous breathing, nonlabored ventilation and respiratory function stable Cardiovascular status: blood pressure returned to baseline and stable Postop Assessment: no apparent nausea or vomiting Anesthetic complications: no   No notable events documented.  Last Vitals:  Vitals:   03/16/24 1115 03/16/24 1130  BP: 108/67 117/67  Pulse: (!) 47 (!) 49  Resp: 10 12  Temp:    SpO2: 98% 100%    Last Pain:  Vitals:   03/16/24 1130  TempSrc:   PainSc: Asleep                 [name],[name]      "

## 2024-03-16 NOTE — H&P (Signed)
 "    Robert Carney Apr 26, 1965  [id number].    HPI:  58 y/o M with an umbilical hernia who presents for elective repair. He reports that he is in his usual state of health and denies any recent changes in medication.   ROS: Review of Systems  Constitutional: Negative.   HENT: Negative.    Eyes: Negative.   Respiratory: Negative.    Cardiovascular: Negative.   Gastrointestinal: Negative.   Genitourinary: Negative.   Musculoskeletal: Negative.   Skin: Negative.   Neurological: Negative.   Endo/Heme/Allergies: Negative.   Psychiatric/Behavioral: Negative.      Family History  Problem Relation Age of Onset   Diabetes Mother    Lung cancer Mother    Hypertension Mother    Prostate cancer Father    Hypertension Father    Breast cancer Sister    Breast cancer Maternal Aunt    Colon cancer Neg Hx    Colon polyps Neg Hx    Esophageal cancer Neg Hx    Rectal cancer Neg Hx    Stomach cancer Neg Hx     Past Medical History:  Diagnosis Date   Cervical myelopathy (HCC) 11/01/2016   pt unaware   Gait abnormality 11/01/2016   Hypertension     Past Surgical History:  Procedure Laterality Date   ANTERIOR CERVICAL CORPECTOMY N/A 03/26/2016   Procedure: Cervical  Seven Anterior cervical corpectomy;  Surgeon: Morene Hicks Ditty, MD;  Location: [facility] OR;  Service: Neurosurgery;  Laterality: N/A;  Cervical  Seven Anterior cervical corpectomy   ESOPHAGOGASTRODUODENOSCOPY (EGD) WITH PROPOFOL  N/A 12/26/2022   Procedure: ESOPHAGOGASTRODUODENOSCOPY (EGD) WITH PROPOFOL ;  Surgeon: Legrand Victory LITTIE DOUGLAS, MD;  Location: [facility] ENDOSCOPY;  Service: Gastroenterology;  Laterality: N/A;   tooth pulled      Social History:  reports that he has been smoking cigarettes. He started smoking about 35 years ago. He has a 7 pack-year smoking history. He has been exposed to tobacco smoke. He has never used smokeless tobacco. He reports current alcohol use of about 42.0 standard drinks of alcohol per week. He  reports current drug use. Drug: Marijuana.  Allergies: Allergies[1]  Medications Prior to Admission  Medication Sig Dispense Refill   B Complex Vitamins (B COMPLEX PO) Take 1 tablet by mouth daily.     Cyanocobalamin  (B-12 PO) Take 1 tablet by mouth daily.     folic acid  (FOLVITE ) 1 MG tablet Take 1 tablet (1 mg total) by mouth daily. 90 tablet 0   metoprolol  tartrate (LOPRESSOR ) 25 MG tablet Take 0.5 tablets (12.5 mg total) by mouth 2 (two) times daily. 90 tablet 0   pantoprazole  (PROTONIX ) 40 MG tablet Take 1 tablet (40 mg total) by mouth daily. 90 tablet 0   thiamine  (VITAMIN B1) 100 MG tablet Take 1 tablet (100 mg total) by mouth daily. (Patient taking differently: Take 50 mg by mouth every other day.) 90 tablet 0   potassium chloride  (KLOR-CON ) 10 MEQ tablet Take 2 tablets (20 mEq total) by mouth daily. (Patient not taking: Reported on 02/27/2024) 2 tablet 0    Physical Exam: Blood pressure (!) 155/93, pulse (!) 55, temperature 97.9 F (36.6 C), temperature source Oral, resp. rate 16, height 6' (1.829 m), weight 54.4 kg, SpO2 100%. Gen: male, NAD Abd: soft, non-distended, umbilical bulge marked  No results found for this or any previous visit (from the past 48 hours). No results found.  Assessment/Plan 58 y/o M with an umbilical hernia  -  Will proceed to  the OR. We discussed the alternatives and potential risks of surgery, including but not limited to: bleeding, infection, damage to bowel or surrounding structures, mesh complications, chronic pain, recurrent hernia, and need for additional procedures. All questions were addressed and consent was obtained.    Cordella DELENA Polly Marlis Cheron Surgery 03/16/2024, 7:06 AM Please see Amion for pager number during day hours 7:00am-4:30pm or 7:00am -11:30am on weekends     [1]  Allergies Allergen Reactions   No Known Allergies    "

## 2024-03-17 ENCOUNTER — Encounter (HOSPITAL_COMMUNITY): Payer: Self-pay | Admitting: General Surgery

## 2024-03-18 ENCOUNTER — Other Ambulatory Visit: Payer: Self-pay | Admitting: Family Medicine

## 2024-03-18 ENCOUNTER — Other Ambulatory Visit: Payer: Self-pay

## 2024-03-18 MED ORDER — PANTOPRAZOLE SODIUM 40 MG PO TBEC
40.0000 mg | DELAYED_RELEASE_TABLET | Freq: Every day | ORAL | 0 refills | Status: AC
  Filled 2024-03-18: qty 90, 90d supply, fill #0

## 2024-03-18 NOTE — Telephone Encounter (Signed)
 Copied from CRM #[id number]. Topic: Clinical - Medication Refill >> Mar 18, 2024 10:03 AM Frederich PARAS wrote: Medicatiopantoprazole (PROTONIX ) 40 MG table, thiamine  (VITAMIN B1) 100 MG tablet , potassium chloride  (KLOR-CON ) 10 MEQ tablet  This is the patient's preferred pharmacy:  [facility] [address], [address] [place] [address] Phone: [phone] Fax: [phone]  Is this the correct pharmacy for this prescription? Yes If no, delete pharmacy and type the correct one.   Has the prescription been filled recently? Yes  Is the patient out of the medication? Yes  Has the patient been seen for an appointment in the last year OR does the patient have an upcoming appointment? Yes  Can we respond through MyChart? Yes  Agent: Please be advised that Rx refills may take up to 3 business days. We ask that you follow-up with your pharmacy.

## 2024-03-20 ENCOUNTER — Other Ambulatory Visit: Payer: Self-pay

## 2024-03-20 MED ORDER — THIAMINE HCL 100 MG PO TABS
100.0000 mg | ORAL_TABLET | Freq: Every day | ORAL | 0 refills | Status: AC
  Filled 2024-03-20: qty 90, 90d supply, fill #0

## 2024-03-24 ENCOUNTER — Ambulatory Visit (INDEPENDENT_AMBULATORY_CARE_PROVIDER_SITE_OTHER): Admitting: Family Medicine

## 2024-03-24 ENCOUNTER — Encounter: Payer: Self-pay | Admitting: Family Medicine

## 2024-03-24 VITALS — BP 124/76 | HR 69 | Temp 97.8°F | Ht 72.0 in | Wt 121.0 lb

## 2024-03-24 DIAGNOSIS — D649 Anemia, unspecified: Secondary | ICD-10-CM | POA: Diagnosis not present

## 2024-03-24 DIAGNOSIS — E519 Thiamine deficiency, unspecified: Secondary | ICD-10-CM | POA: Diagnosis not present

## 2024-03-24 DIAGNOSIS — I10 Essential (primary) hypertension: Secondary | ICD-10-CM

## 2024-03-24 DIAGNOSIS — H539 Unspecified visual disturbance: Secondary | ICD-10-CM

## 2024-03-24 DIAGNOSIS — E538 Deficiency of other specified B group vitamins: Secondary | ICD-10-CM

## 2024-03-24 DIAGNOSIS — R499 Unspecified voice and resonance disorder: Secondary | ICD-10-CM | POA: Diagnosis not present

## 2024-03-24 NOTE — Progress Notes (Signed)
 "  Subjective:     Patient ID: Robert Carney, male    DOB: [birth year], 59 y.o.   MRN: [id number]  Chief Complaint  Patient presents with   Medical Management of Chronic Issues    2 month fasting    HPI  Discussed the use of AI scribe software for clinical note transcription with the patient, who gave verbal consent to proceed.  History of Present Illness Robert Carney is a 59 year old male who presents for follow-up on chronic health conditions.  Postoperative status: ventral hernia repair - Underwent robot-assisted ventral hernia repair on December 29 - Persistent discomfort at the surgical site - Overall feeling well postoperatively  Gallbladder abnormalities - Gallbladder sludge identified on gastroenterology report dated December 11 - No current alcohol use  Voice changes and hoarseness - Hoarseness and voice changes since prior surgery. Requests to see ENT - No sore throat - History of hardware placement in throat during previous procedure  Anemia and gastrointestinal bleed - History of anemia secondary to prior gastrointestinal bleed - Hemoglobin normalized on blood work dated December 15  Hypertension - Was off antihypertensive medication for approximately one week, now restarted - Requires new device for home blood pressure monitoring  Medication and supplement use - Currently taking thiamine , Protonix , vitamin B12, folic acid  - Uses Tylenol  instead of Motrin  - Previously took potassium supplements for low levels, not currently taking     Health Maintenance Due  Topic Date Due   COVID-19 Vaccine (1) Never done   Pneumococcal Vaccine: 50+ Years (1 of 2 - PCV) Never done   Zoster Vaccines- Shingrix  (1 of 2) Never done   COLON CANCER SCREENING ANNUAL FOBT  12/25/2023   Fecal DNA (Cologuard)  02/07/2024    Past Medical History:  Diagnosis Date   Cervical myelopathy (HCC) 11/01/2016   pt unaware   Gait abnormality 11/01/2016   Hypertension      Past Surgical History:  Procedure Laterality Date   ANTERIOR CERVICAL CORPECTOMY N/A 03/26/2016   Procedure: Cervical  Seven Anterior cervical corpectomy;  Surgeon: Morene Hicks Ditty, MD;  Location: [facility] OR;  Service: Neurosurgery;  Laterality: N/A;  Cervical  Seven Anterior cervical corpectomy   ESOPHAGOGASTRODUODENOSCOPY (EGD) WITH PROPOFOL  N/A 12/26/2022   Procedure: ESOPHAGOGASTRODUODENOSCOPY (EGD) WITH PROPOFOL ;  Surgeon: Legrand Victory LITTIE DOUGLAS, MD;  Location: [facility] ENDOSCOPY;  Service: Gastroenterology;  Laterality: N/A;   tooth pulled     XI ROBOTIC ASSISTED VENTRAL HERNIA N/A 03/16/2024   Procedure: REPAIR, HERNIA, VENTRAL, ROBOT-ASSISTED;  Surgeon: Polly Cordella LABOR, MD;  Location: [facility] ORS;  Service: General;  Laterality: N/A;  ROBOTIC VENTRAL HERNIA REPAIR WITH MESH    Family History  Problem Relation Age of Onset   Diabetes Mother    Lung cancer Mother    Hypertension Mother    Prostate cancer Father    Hypertension Father    Breast cancer Sister    Breast cancer Maternal Aunt    Colon cancer Neg Hx    Colon polyps Neg Hx    Esophageal cancer Neg Hx    Rectal cancer Neg Hx    Stomach cancer Neg Hx     Social History   Socioeconomic History   Marital status: Single    Spouse name: Not on file   Number of children: 4   Years of education: 12   Highest education level: Not on file  Occupational History   Occupation: [employer]   Tobacco Use   Smoking status:  Some Days    Current packs/day: 0.20    Average packs/day: 0.2 packs/day for 35.0 years (7.0 ttl pk-yrs)    Types: Cigarettes    Start date: 03/19/1989    Passive exposure: Current   Smokeless tobacco: Never  Vaping Use   Vaping status: Never Used  Substance and Sexual Activity   Alcohol use: Yes    Alcohol/week: 42.0 standard drinks of alcohol    Types: 42 Cans of beer per week    Comment: Daily beer drinker   Drug use: Yes    Types: Marijuana    Comment: No history of IV drug use.    Sexual activity: Yes  Other Topics Concern   Not on file  Social History Narrative   Lives with mother, Dickey   Caffeine use: sometimes   Right handed   Social Drivers of Health   Tobacco Use: High Risk (03/24/2024)   Patient History    Smoking Tobacco Use: Some Days    Smokeless Tobacco Use: Never    Passive Exposure: Current  Financial Resource Strain: Low Risk (08/08/2023)   Overall Financial Resource Strain (CARDIA)    Difficulty of Paying Living Expenses: Not hard at all  Food Insecurity: No Food Insecurity (08/08/2023)   Hunger Vital Sign    Worried About Running Out of Food in the Last Year: Never true    Ran Out of Food in the Last Year: Never true  Transportation Needs: No Transportation Needs (08/08/2023)   PRAPARE - Administrator, Civil Service (Medical): No    Lack of Transportation (Non-Medical): No  Physical Activity: Insufficiently Active (08/08/2023)   Exercise Vital Sign    Days of Exercise per Week: 3 days    Minutes of Exercise per Session: 40 min  Stress: No Stress Concern Present (08/08/2023)   [employer] of Occupational Health - Occupational Stress Questionnaire    Feeling of Stress : Not at all  Social Connections: Moderately Integrated (08/08/2023)   Social Connection and Isolation Panel    Frequency of Communication with Friends and Family: More than three times a week    Frequency of Social Gatherings with Friends and Family: More than three times a week    Attends Religious Services: More than 4 times per year    Active Member of [employer] or Organizations: Yes    Attends Banker Meetings: More than 4 times per year    Marital Status: Never married  Intimate Partner Violence: Not At Risk (08/08/2023)   Humiliation, Afraid, Rape, and Kick questionnaire    Fear of Current or Ex-Partner: No    Emotionally Abused: No    Physically Abused: No    Sexually Abused: No  Depression (PHQ2-9): Low Risk (03/24/2024)   Depression (PHQ2-9)     PHQ-2 Score: 0  Alcohol Screen: Low Risk (08/08/2023)   Alcohol Screen    Last Alcohol Screening Score (AUDIT): 3  Housing: Unknown (12/09/2023)   Received from [facility]   Epic    Unable to Pay for Housing in the Last Year: Not on file    Number of Times Moved in the Last Year: Not on file    At any time in the past 12 months, were you homeless or living in a shelter (including now)?: No  Utilities: Not At Risk (08/08/2023)   [facility] Utilities    Threatened with loss of utilities: No  Health Literacy: Adequate Health Literacy (08/08/2023)   B1300 Health Literacy  Frequency of need for help with medical instructions: Never    Outpatient Medications Prior to Visit  Medication Sig Dispense Refill   pantoprazole  (PROTONIX ) 40 MG tablet Take 1 tablet (40 mg total) by mouth daily. 90 tablet 0   thiamine  (VITAMIN B1) 100 MG tablet Take 1 tablet (100 mg total) by mouth daily. 90 tablet 0   B Complex Vitamins (B COMPLEX PO) Take 1 tablet by mouth daily. (Patient not taking: Reported on 03/24/2024)     Cyanocobalamin  (B-12 PO) Take 1 tablet by mouth daily. (Patient not taking: Reported on 03/24/2024)     folic acid  (FOLVITE ) 1 MG tablet Take 1 tablet (1 mg total) by mouth daily. (Patient not taking: Reported on 03/24/2024) 90 tablet 0   metoprolol  tartrate (LOPRESSOR ) 25 MG tablet Take 0.5 tablets (12.5 mg total) by mouth 2 (two) times daily. (Patient not taking: Reported on 03/24/2024) 90 tablet 0   ibuprofen  (MOTRIN  IB) 200 MG tablet Take 3 tablets (600 mg total) by mouth every 6 (six) hours for 6 days. (Patient not taking: Reported on 03/24/2024) 100 tablet 0   potassium chloride  (KLOR-CON ) 10 MEQ tablet Take 2 tablets (20 mEq total) by mouth daily. (Patient not taking: Reported on 03/24/2024) 2 tablet 0   No facility-administered medications prior to visit.    Allergies[1]  ROS Per HPI    Objective:    Physical Exam Constitutional:      General: He is not in acute  distress.    Appearance: He is not ill-appearing.  HENT:     Mouth/Throat:     Mouth: Mucous membranes are moist.     Pharynx: Oropharynx is clear.  Eyes:     Extraocular Movements: Extraocular movements intact.     Conjunctiva/sclera: Conjunctivae normal.  Cardiovascular:     Rate and Rhythm: Normal rate and regular rhythm.  Pulmonary:     Effort: Pulmonary effort is normal.     Breath sounds: Normal breath sounds.  Musculoskeletal:     Cervical back: Normal range of motion and neck supple.     Right lower leg: No edema.     Left lower leg: No edema.  Skin:    General: Skin is warm and dry.  Neurological:     General: No focal deficit present.     Mental Status: He is alert and oriented to person, place, and time.     Motor: No weakness.     Coordination: Coordination normal.     Gait: Gait normal.  Psychiatric:        Mood and Affect: Mood normal.        Behavior: Behavior normal.        Thought Content: Thought content normal.      BP 124/76   Pulse 69   Temp 97.8 F (36.6 C) (Temporal)   Ht 6' (1.829 m)   Wt 121 lb (54.9 kg)   SpO2 98%   BMI 16.41 kg/m  Wt Readings from Last 3 Encounters:  03/24/24 121 lb (54.9 kg)  03/16/24 120 lb (54.4 kg)  03/02/24 120 lb (54.4 kg)       Assessment & Plan:   Problem List Items Addressed This Visit     Essential hypertension - Primary   Normocytic anemia   Other Visit Diagnoses       Vitamin B1 deficiency         Vitamin B12 deficiency         Vision changes  Relevant Orders   Ambulatory referral to Ophthalmology     Hoarseness or changing voice       Relevant Orders   Ambulatory referral to ENT       Assessment and Plan Assessment & Plan Essential hypertension Blood pressure is well-controlled at 124/76 mmHg. He has been off antihypertensive medication for a week but has resumed taking it. Abstinence from alcohol may contribute to improved blood pressure control. - Continue current  antihypertensive medication regimen - Monitor blood pressure regularly at home  Vitamin B1 deficiency Thiamine  levels have been low in the past but are currently slightly elevated. - Continue thiamine  supplementation every other day  Vitamin B12 deficiency He is taking B12 supplements daily. - Continue B12 supplementation daily  Normocytic anemia, history of gastrointestinal bleeding No current anemia. Hemoglobin levels were normal on December 15th. Previous anemia was due to gastrointestinal bleeding.  Ventral hernia, status post repair Status post robot-assisted ventral hernia repair on December 29th. He reports feeling good with no complications. - Follow up with gastroenterologist as scheduled  Gallbladder sludge Ultrasound showed gallbladder sludge without stones. No liver mass or significant fluid collection around the liver. - Continue monitoring with gastroenterologist  Hoarseness Intermittent hoarseness since surgery.  - Referred to ENT specialist for evaluation of hoarseness  Vision changes He reports needing an eye examination and possibly glasses. - Referred to ophthalmologist for eye examination     I have discontinued Garreth L. Fauble's potassium chloride  and ibuprofen . I am also having him maintain his Cyanocobalamin  (B-12 PO), folic acid , metoprolol  tartrate, B Complex Vitamins (B COMPLEX PO), pantoprazole , and thiamine .  No orders of the defined types were placed in this encounter.      [1]  Allergies Allergen Reactions   No Known Allergies    "

## 2024-03-24 NOTE — Patient Instructions (Addendum)
 Continue folic acid , Protonix , vitamin B12, Thiamine  (every other day).   Monitor your blood pressure and pulse   Check with your GI doctor to see if you need a follow up appointment [phone]

## 2024-03-26 ENCOUNTER — Other Ambulatory Visit: Payer: Self-pay

## 2024-03-26 ENCOUNTER — Other Ambulatory Visit: Payer: Self-pay | Admitting: Family Medicine

## 2024-03-26 MED ORDER — FOLIC ACID 1 MG PO TABS
1.0000 mg | ORAL_TABLET | Freq: Every day | ORAL | 0 refills | Status: AC
  Filled 2024-03-26: qty 90, 90d supply, fill #0

## 2024-03-26 NOTE — Telephone Encounter (Signed)
 Copied from CRM #[id number]. Topic: Clinical - Medication Refill >> Mar 26, 2024  8:34 AM Burnard DEL wrote: Medication: B Complex Vitamins (B COMPLEX PO)  folic acid  (FOLVITE ) 1 MG tablet Has the patient contacted their pharmacy? No (Agent: If no, request that the patient contact the pharmacy for the refill. If patient does not wish to contact the pharmacy document the reason why and proceed with request.) (Agent: If yes, when and what did the pharmacy advise?)  This is the patient's preferred pharmacy:  [facility] [address] [place] [address] Phone: [phone] Fax: [phone]    Is this the correct pharmacy for this prescription? Yes If no, delete pharmacy and type the correct one.   Has the prescription been filled recently? No  Is the patient out of the medication? Yes  Has the patient been seen for an appointment in the last year OR does the patient have an upcoming appointment? Yes  Can we respond through MyChart? Yes  Agent: Please be advised that Rx refills may take up to 3 business days. We ask that you follow-up with your pharmacy.

## 2024-03-26 NOTE — Telephone Encounter (Signed)
 B complex is otc

## 2024-03-26 NOTE — Telephone Encounter (Signed)
:   B Complex Vitamins (B COMPLEX PO) --historical med- needing Cap or tablet

## 2024-04-22 ENCOUNTER — Ambulatory Visit (INDEPENDENT_AMBULATORY_CARE_PROVIDER_SITE_OTHER): Admitting: Otolaryngology

## 2024-04-22 ENCOUNTER — Encounter (INDEPENDENT_AMBULATORY_CARE_PROVIDER_SITE_OTHER): Payer: Self-pay | Admitting: Otolaryngology

## 2024-04-22 VITALS — BP 119/75 | HR 86 | Ht 72.0 in | Wt 120.0 lb

## 2024-04-22 DIAGNOSIS — J3800 Paralysis of vocal cords and larynx, unspecified: Secondary | ICD-10-CM

## 2024-04-22 NOTE — Progress Notes (Signed)
 Reason for Consult: Hoarseness Referring Physician: Dr. Lendia Darilyn LITTIE Robert Carney is an 59 y.o. male.  HPI: He is here for evaluation of hoarseness.  He has had this for 10 years.  It happened right after a anterior cervical surgery.  He was with the hoarse voice immediately.  He has no airway problems.  No dysphagia or odynophagia.  No pain with speaking.  He has not noticed any improvement over the years of this vocal change.  Past Medical History:  Diagnosis Date   Cervical myelopathy (HCC) 11/01/2016   pt unaware   Gait abnormality 11/01/2016   Hypertension     Past Surgical History:  Procedure Laterality Date   ANTERIOR CERVICAL CORPECTOMY N/A 03/26/2016   Procedure: Cervical  Seven Anterior cervical corpectomy;  Surgeon: Morene Hicks Ditty, MD;  Location: [facility] OR;  Service: Neurosurgery;  Laterality: N/A;  Cervical  Seven Anterior cervical corpectomy   ESOPHAGOGASTRODUODENOSCOPY (EGD) WITH PROPOFOL  N/A 12/26/2022   Procedure: ESOPHAGOGASTRODUODENOSCOPY (EGD) WITH PROPOFOL ;  Surgeon: Legrand Victory LITTIE DOUGLAS, MD;  Location: [facility] ENDOSCOPY;  Service: Gastroenterology;  Laterality: N/A;   tooth pulled     XI ROBOTIC ASSISTED VENTRAL HERNIA N/A 03/16/2024   Procedure: REPAIR, HERNIA, VENTRAL, ROBOT-ASSISTED;  Surgeon: Polly Cordella LABOR, MD;  Location: [facility] ORS;  Service: General;  Laterality: N/A;  ROBOTIC VENTRAL HERNIA REPAIR WITH MESH    Family History  Problem Relation Age of Onset   Diabetes Mother    Lung cancer Mother    Hypertension Mother    Prostate cancer Father    Hypertension Father    Breast cancer Sister    Breast cancer Maternal Aunt    Colon cancer Neg Hx    Colon polyps Neg Hx    Esophageal cancer Neg Hx    Rectal cancer Neg Hx    Stomach cancer Neg Hx     Social History:  reports that he has been smoking cigarettes. He started smoking about 35 years ago. He has a 7 pack-year smoking history. He has been exposed to tobacco smoke. He has never used smokeless tobacco. He  reports current alcohol use of about 42.0 standard drinks of alcohol per week. He reports current drug use. Drug: Marijuana.  Allergies: Allergies[1]   No results found for this or any previous visit (from the past 48 hours).  No results found.  ROS There were no vitals taken for this visit. Physical Exam Constitutional:      Appearance: Normal appearance.  HENT:     Head: Normocephalic and atraumatic.     Right Ear: Tympanic membrane is without lesions and middle ear aerated, ear canal and external ear normal.     Left Ear: Tympanic membrane is without lesions and middle ear aerated, ear canal and external ear normal.     Nose: Nose without deviation of septum.  Turbinates with mild hypertrophy, No significant swelling or masses.     Oral cavity/oropharynx: Mucous membranes are moist. No lesions or masses    Larynx: Voice is high pitched and gravelly. Mirror attempted without success    Eyes:     Extraocular Movements: Extraocular movements intact.     Conjunctiva/sclera: Conjunctivae normal.     Pupils: Pupils are equal, round, and reactive to light.  Cardiovascular:     Rate and Rhythm: Normal rate.  Pulmonary:     Effort: Pulmonary effort is normal.  Musculoskeletal:     Cervical back: Normal range of motion and neck supple. No rigidity.  Lymphadenopathy:  Cervical: No cervical adenopathy or masses.salivary glands without lesions. .     Salivary glands- no mass or swelling Neurological:     Mental Status: He is alert. CN 2-12 intact. No nystagmus   Flexible fibroptic laryngoscopy  Patient was informed of risks, benefits, and options. All questions answered. Consent obtained.   The scope was passed through the nose and tracked into the nasopharynx. The nasopharynx without lesions or masses. The scope was positioned over the base of tongue and epiglottis. There was no obvious lesions or significant swelling and any of the laryngeal or pharyngeal structures. The vocal  cords have normal appearance with no lesions.  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  There does not appear to be too much bowing.  Left cord moves normally.  The subglottis has minimal visualization but no lesions identified. The scope was removed without difficulty and patient tolerated well.     Assessment/Plan: Right vocal cord paralysis-we discussed the options of injection versus a laryngoplasty.  He would prefer the injection first to see if his voice will improve with medializing the cord.  I will get one of the doctors in the office to see him and discuss in more detail options for voice improvement for his paralysis.  Norleen Notice 04/22/2024, 10:57 AM        [1]  Allergies Allergen Reactions   No Known Allergies

## 2024-05-01 ENCOUNTER — Ambulatory Visit: Admitting: Gastroenterology

## 2024-05-01 ENCOUNTER — Ambulatory Visit

## 2024-06-02 ENCOUNTER — Ambulatory Visit (INDEPENDENT_AMBULATORY_CARE_PROVIDER_SITE_OTHER)

## 2024-07-20 ENCOUNTER — Ambulatory Visit

## 2024-08-11 ENCOUNTER — Ambulatory Visit
